# Patient Record
Sex: Male | Born: 1935
Health system: Southern US, Community
[De-identification: ages and names within clinical notes are randomized; demographics above are authoritative.]

## PROBLEM LIST (undated history)

## (undated) DIAGNOSIS — I4891 Unspecified atrial fibrillation: Secondary | ICD-10-CM

## (undated) DIAGNOSIS — Z7189 Other specified counseling: Secondary | ICD-10-CM

## (undated) DIAGNOSIS — R918 Other nonspecific abnormal finding of lung field: Secondary | ICD-10-CM

## (undated) DIAGNOSIS — Z973 Presence of spectacles and contact lenses: Secondary | ICD-10-CM

## (undated) DIAGNOSIS — C3490 Malignant neoplasm of unspecified part of unspecified bronchus or lung: Secondary | ICD-10-CM

## (undated) DIAGNOSIS — D509 Iron deficiency anemia, unspecified: Secondary | ICD-10-CM

## (undated) DIAGNOSIS — C3491 Malignant neoplasm of unspecified part of right bronchus or lung: Secondary | ICD-10-CM

## (undated) DIAGNOSIS — E039 Hypothyroidism, unspecified: Secondary | ICD-10-CM

## (undated) DIAGNOSIS — C3430 Malignant neoplasm of lower lobe, unspecified bronchus or lung: Secondary | ICD-10-CM

## (undated) DIAGNOSIS — C241 Malignant neoplasm of ampulla of Vater: Secondary | ICD-10-CM

## (undated) DIAGNOSIS — Z9289 Personal history of other medical treatment: Secondary | ICD-10-CM

## (undated) DIAGNOSIS — I10 Essential (primary) hypertension: Secondary | ICD-10-CM

## (undated) DIAGNOSIS — C7802 Secondary malignant neoplasm of left lung: Secondary | ICD-10-CM

## (undated) DIAGNOSIS — C801 Malignant (primary) neoplasm, unspecified: Secondary | ICD-10-CM

## (undated) HISTORY — DX: Iron deficiency anemia, unspecified: D50.9

## (undated) HISTORY — PX: CHOLECYSTECTOMY: SHX55

## (undated) HISTORY — PX: COLONOSCOPY: SHX174

## (undated) HISTORY — PX: MULTIPLE TOOTH EXTRACTIONS: SHX2053

## (undated) HISTORY — PX: TONSILLECTOMY: SUR1361

## (undated) HISTORY — DX: Malignant neoplasm of unspecified part of right bronchus or lung: C34.91

## (undated) HISTORY — PX: HERNIA REPAIR: SHX51

## (undated) HISTORY — DX: Secondary malignant neoplasm of left lung: C78.02

## (undated) HISTORY — DX: Malignant neoplasm of ampulla of Vater: C24.1

## (undated) HISTORY — DX: Personal history of other medical treatment: Z92.89

## (undated) HISTORY — DX: Other specified counseling: Z71.89

## (undated) HISTORY — DX: Malignant neoplasm of lower lobe, unspecified bronchus or lung: C34.30

---

## 1998-02-25 ENCOUNTER — Ambulatory Visit (HOSPITAL_BASED_OUTPATIENT_CLINIC_OR_DEPARTMENT_OTHER): Admission: RE | Admit: 1998-02-25 | Discharge: 1998-02-25 | Payer: Self-pay | Admitting: Surgery

## 2001-07-13 ENCOUNTER — Encounter: Payer: Self-pay | Admitting: Urology

## 2001-07-13 ENCOUNTER — Encounter: Admission: RE | Admit: 2001-07-13 | Discharge: 2001-07-13 | Payer: Self-pay | Admitting: Urology

## 2002-07-22 ENCOUNTER — Ambulatory Visit (HOSPITAL_COMMUNITY): Admission: RE | Admit: 2002-07-22 | Discharge: 2002-07-22 | Payer: Self-pay | Admitting: General Surgery

## 2002-07-22 ENCOUNTER — Encounter (INDEPENDENT_AMBULATORY_CARE_PROVIDER_SITE_OTHER): Payer: Self-pay | Admitting: Specialist

## 2002-09-12 ENCOUNTER — Encounter: Payer: Self-pay | Admitting: Gastroenterology

## 2002-09-12 ENCOUNTER — Encounter: Admission: RE | Admit: 2002-09-12 | Discharge: 2002-09-12 | Payer: Self-pay | Admitting: Gastroenterology

## 2002-09-13 ENCOUNTER — Encounter: Payer: Self-pay | Admitting: Gastroenterology

## 2002-09-13 ENCOUNTER — Encounter: Admission: RE | Admit: 2002-09-13 | Discharge: 2002-09-13 | Payer: Self-pay | Admitting: Gastroenterology

## 2002-09-17 ENCOUNTER — Ambulatory Visit (HOSPITAL_COMMUNITY): Admission: RE | Admit: 2002-09-17 | Discharge: 2002-09-17 | Payer: Self-pay | Admitting: Gastroenterology

## 2002-09-17 ENCOUNTER — Encounter: Payer: Self-pay | Admitting: Gastroenterology

## 2002-09-17 ENCOUNTER — Encounter (INDEPENDENT_AMBULATORY_CARE_PROVIDER_SITE_OTHER): Payer: Self-pay | Admitting: *Deleted

## 2002-10-04 ENCOUNTER — Encounter: Payer: Self-pay | Admitting: General Surgery

## 2002-10-09 ENCOUNTER — Inpatient Hospital Stay (HOSPITAL_COMMUNITY): Admission: RE | Admit: 2002-10-09 | Discharge: 2002-10-25 | Payer: Self-pay | Admitting: General Surgery

## 2002-10-09 ENCOUNTER — Encounter (INDEPENDENT_AMBULATORY_CARE_PROVIDER_SITE_OTHER): Payer: Self-pay | Admitting: Specialist

## 2002-10-09 ENCOUNTER — Encounter: Payer: Self-pay | Admitting: General Surgery

## 2002-10-18 ENCOUNTER — Encounter: Payer: Self-pay | Admitting: General Surgery

## 2002-11-11 ENCOUNTER — Ambulatory Visit: Admission: RE | Admit: 2002-11-11 | Discharge: 2003-01-15 | Payer: Self-pay | Admitting: Radiation Oncology

## 2003-02-18 ENCOUNTER — Ambulatory Visit: Admission: RE | Admit: 2003-02-18 | Discharge: 2003-02-18 | Payer: Self-pay | Admitting: Radiation Oncology

## 2003-03-04 ENCOUNTER — Encounter: Admission: RE | Admit: 2003-03-04 | Discharge: 2003-03-04 | Payer: Self-pay | Admitting: Hematology & Oncology

## 2003-05-15 ENCOUNTER — Encounter: Admission: RE | Admit: 2003-05-15 | Discharge: 2003-05-15 | Payer: Self-pay | Admitting: Hematology & Oncology

## 2003-05-26 ENCOUNTER — Ambulatory Visit (HOSPITAL_COMMUNITY): Admission: RE | Admit: 2003-05-26 | Discharge: 2003-05-26 | Payer: Self-pay | Admitting: Hematology & Oncology

## 2003-09-17 ENCOUNTER — Ambulatory Visit (HOSPITAL_COMMUNITY): Admission: RE | Admit: 2003-09-17 | Discharge: 2003-09-17 | Payer: Self-pay | Admitting: Hematology & Oncology

## 2004-01-08 ENCOUNTER — Ambulatory Visit: Payer: Self-pay | Admitting: Hematology & Oncology

## 2004-01-09 ENCOUNTER — Ambulatory Visit (HOSPITAL_COMMUNITY): Admission: RE | Admit: 2004-01-09 | Discharge: 2004-01-09 | Payer: Self-pay | Admitting: Hematology & Oncology

## 2004-05-05 ENCOUNTER — Ambulatory Visit: Payer: Self-pay | Admitting: Hematology & Oncology

## 2004-05-07 ENCOUNTER — Ambulatory Visit (HOSPITAL_COMMUNITY): Admission: RE | Admit: 2004-05-07 | Discharge: 2004-05-07 | Payer: Self-pay | Admitting: Hematology & Oncology

## 2004-09-14 ENCOUNTER — Ambulatory Visit: Payer: Self-pay | Admitting: Hematology & Oncology

## 2004-10-05 ENCOUNTER — Ambulatory Visit (HOSPITAL_COMMUNITY): Admission: RE | Admit: 2004-10-05 | Discharge: 2004-10-05 | Payer: Self-pay | Admitting: Hematology & Oncology

## 2005-03-29 ENCOUNTER — Ambulatory Visit: Payer: Self-pay | Admitting: Hematology & Oncology

## 2005-04-01 ENCOUNTER — Ambulatory Visit (HOSPITAL_COMMUNITY): Admission: RE | Admit: 2005-04-01 | Discharge: 2005-04-01 | Payer: Self-pay | Admitting: Hematology & Oncology

## 2005-09-26 ENCOUNTER — Ambulatory Visit: Payer: Self-pay | Admitting: Hematology & Oncology

## 2005-09-28 LAB — COMPREHENSIVE METABOLIC PANEL
AST: 15 U/L (ref 0–37)
Alkaline Phosphatase: 45 U/L (ref 39–117)
BUN: 23 mg/dL (ref 6–23)
Creatinine, Ser: 1.07 mg/dL (ref 0.40–1.50)
Glucose, Bld: 124 mg/dL — ABNORMAL HIGH (ref 70–99)
Total Bilirubin: 0.7 mg/dL (ref 0.3–1.2)

## 2005-09-28 LAB — CBC WITH DIFFERENTIAL/PLATELET
Basophils Absolute: 0 10*3/uL (ref 0.0–0.1)
EOS%: 8.9 % — ABNORMAL HIGH (ref 0.0–7.0)
HCT: 37.8 % — ABNORMAL LOW (ref 38.7–49.9)
HGB: 12.8 g/dL — ABNORMAL LOW (ref 13.0–17.1)
MCH: 31.2 pg (ref 28.0–33.4)
MCV: 92.2 fL (ref 81.6–98.0)
MONO%: 11.6 % (ref 0.0–13.0)
NEUT%: 61.6 % (ref 40.0–75.0)
lymph#: 0.8 10*3/uL — ABNORMAL LOW (ref 0.9–3.3)

## 2005-09-30 ENCOUNTER — Ambulatory Visit (HOSPITAL_COMMUNITY): Admission: RE | Admit: 2005-09-30 | Discharge: 2005-09-30 | Payer: Self-pay | Admitting: Hematology & Oncology

## 2005-12-01 ENCOUNTER — Ambulatory Visit: Payer: Self-pay | Admitting: Hematology & Oncology

## 2006-03-27 ENCOUNTER — Ambulatory Visit: Payer: Self-pay | Admitting: Hematology & Oncology

## 2006-03-29 LAB — CBC WITH DIFFERENTIAL/PLATELET
Basophils Absolute: 0 10*3/uL (ref 0.0–0.1)
Eosinophils Absolute: 0.4 10*3/uL (ref 0.0–0.5)
HCT: 38.1 % — ABNORMAL LOW (ref 38.7–49.9)
HGB: 13.2 g/dL (ref 13.0–17.1)
LYMPH%: 16.8 % (ref 14.0–48.0)
MONO#: 0.6 10*3/uL (ref 0.1–0.9)
NEUT#: 3.8 10*3/uL (ref 1.5–6.5)
Platelets: 138 10*3/uL — ABNORMAL LOW (ref 145–400)
RBC: 4.22 10*6/uL (ref 4.20–5.71)
WBC: 5.9 10*3/uL (ref 4.0–10.0)

## 2006-03-29 LAB — COMPREHENSIVE METABOLIC PANEL
Albumin: 3.9 g/dL (ref 3.5–5.2)
BUN: 19 mg/dL (ref 6–23)
CO2: 28 mEq/L (ref 19–32)
Glucose, Bld: 131 mg/dL — ABNORMAL HIGH (ref 70–99)
Sodium: 139 mEq/L (ref 135–145)
Total Bilirubin: 0.6 mg/dL (ref 0.3–1.2)
Total Protein: 6.5 g/dL (ref 6.0–8.3)

## 2006-03-29 LAB — CANCER ANTIGEN 19-9: CA 19-9: 6.6 U/mL (ref <35.0)

## 2006-03-31 ENCOUNTER — Ambulatory Visit (HOSPITAL_COMMUNITY): Admission: RE | Admit: 2006-03-31 | Discharge: 2006-03-31 | Payer: Self-pay | Admitting: Hematology & Oncology

## 2006-09-22 ENCOUNTER — Ambulatory Visit: Payer: Self-pay | Admitting: Hematology & Oncology

## 2006-09-26 LAB — COMPREHENSIVE METABOLIC PANEL
ALT: 14 U/L (ref 0–53)
BUN: 20 mg/dL (ref 6–23)
CO2: 29 mEq/L (ref 19–32)
Calcium: 10 mg/dL (ref 8.4–10.5)
Creatinine, Ser: 0.97 mg/dL (ref 0.40–1.50)
Glucose, Bld: 126 mg/dL — ABNORMAL HIGH (ref 70–99)
Total Bilirubin: 0.6 mg/dL (ref 0.3–1.2)

## 2006-09-26 LAB — CBC WITH DIFFERENTIAL/PLATELET
BASO%: 0.6 % (ref 0.0–2.0)
HCT: 37.3 % — ABNORMAL LOW (ref 38.7–49.9)
MCHC: 34.6 g/dL (ref 32.0–35.9)
MONO#: 0.5 10*3/uL (ref 0.1–0.9)
RBC: 4.09 10*6/uL — ABNORMAL LOW (ref 4.20–5.71)
WBC: 5.4 10*3/uL (ref 4.0–10.0)
lymph#: 1.2 10*3/uL (ref 0.9–3.3)

## 2006-09-26 LAB — CANCER ANTIGEN 19-9: CA 19-9: 1.4 U/mL (ref <35.0)

## 2006-09-27 ENCOUNTER — Ambulatory Visit (HOSPITAL_COMMUNITY): Admission: RE | Admit: 2006-09-27 | Discharge: 2006-09-27 | Payer: Self-pay | Admitting: Hematology & Oncology

## 2007-03-26 ENCOUNTER — Ambulatory Visit: Payer: Self-pay | Admitting: Hematology & Oncology

## 2007-03-28 LAB — CBC WITH DIFFERENTIAL/PLATELET
BASO%: 0.1 % (ref 0.0–2.0)
EOS%: 7.6 % — ABNORMAL HIGH (ref 0.0–7.0)
HCT: 44.3 % (ref 38.7–49.9)
LYMPH%: 20.9 % (ref 14.0–48.0)
MCH: 29 pg (ref 28.0–33.4)
MCHC: 31.7 g/dL — ABNORMAL LOW (ref 32.0–35.9)
NEUT%: 60.2 % (ref 40.0–75.0)
Platelets: 167 10*3/uL (ref 145–400)
lymph#: 1.5 10*3/uL (ref 0.9–3.3)

## 2007-03-28 LAB — COMPREHENSIVE METABOLIC PANEL
ALT: 16 U/L (ref 0–53)
AST: 14 U/L (ref 0–37)
Creatinine, Ser: 1.04 mg/dL (ref 0.40–1.50)
Total Bilirubin: 0.7 mg/dL (ref 0.3–1.2)

## 2007-03-29 ENCOUNTER — Ambulatory Visit (HOSPITAL_COMMUNITY): Admission: RE | Admit: 2007-03-29 | Discharge: 2007-03-29 | Payer: Self-pay | Admitting: Hematology & Oncology

## 2007-04-11 ENCOUNTER — Ambulatory Visit (HOSPITAL_COMMUNITY): Admission: RE | Admit: 2007-04-11 | Discharge: 2007-04-11 | Payer: Self-pay | Admitting: Hematology & Oncology

## 2007-05-31 ENCOUNTER — Ambulatory Visit (HOSPITAL_COMMUNITY): Admission: RE | Admit: 2007-05-31 | Discharge: 2007-05-31 | Payer: Self-pay | Admitting: Hematology & Oncology

## 2007-06-12 ENCOUNTER — Ambulatory Visit: Payer: Self-pay | Admitting: Thoracic Surgery (Cardiothoracic Vascular Surgery)

## 2007-06-13 ENCOUNTER — Ambulatory Visit: Payer: Self-pay | Admitting: Hematology & Oncology

## 2007-07-05 ENCOUNTER — Ambulatory Visit (HOSPITAL_COMMUNITY): Admission: RE | Admit: 2007-07-05 | Discharge: 2007-07-05 | Payer: Self-pay | Admitting: Cardiology

## 2007-09-03 ENCOUNTER — Ambulatory Visit: Payer: Self-pay | Admitting: Hematology & Oncology

## 2007-09-04 ENCOUNTER — Ambulatory Visit: Payer: Self-pay | Admitting: Hematology & Oncology

## 2007-09-04 LAB — CBC WITH DIFFERENTIAL/PLATELET
BASO%: 0.5 % (ref 0.0–2.0)
Basophils Absolute: 0 10*3/uL (ref 0.0–0.1)
Eosinophils Absolute: 0.4 10*3/uL (ref 0.0–0.5)
HCT: 39.4 % (ref 38.7–49.9)
HGB: 13.4 g/dL (ref 13.0–17.1)
MONO#: 0.7 10*3/uL (ref 0.1–0.9)
NEUT#: 2.9 10*3/uL (ref 1.5–6.5)
NEUT%: 56.8 % (ref 40.0–75.0)
Platelets: 125 10*3/uL — ABNORMAL LOW (ref 145–400)
WBC: 5.1 10*3/uL (ref 4.0–10.0)
lymph#: 1.1 10*3/uL (ref 0.9–3.3)

## 2007-09-04 LAB — COMPREHENSIVE METABOLIC PANEL
ALT: 12 U/L (ref 0–53)
AST: 15 U/L (ref 0–37)
Calcium: 9.5 mg/dL (ref 8.4–10.5)
Chloride: 101 mEq/L (ref 96–112)
Creatinine, Ser: 1.08 mg/dL (ref 0.40–1.50)
Sodium: 140 mEq/L (ref 135–145)

## 2007-09-24 ENCOUNTER — Ambulatory Visit (HOSPITAL_COMMUNITY): Admission: RE | Admit: 2007-09-24 | Discharge: 2007-09-24 | Payer: Self-pay | Admitting: Hematology & Oncology

## 2008-01-09 ENCOUNTER — Ambulatory Visit: Payer: Self-pay | Admitting: Hematology & Oncology

## 2008-01-11 LAB — CBC WITH DIFFERENTIAL/PLATELET
Eosinophils Absolute: 0.4 10*3/uL (ref 0.0–0.5)
HCT: 39.5 % (ref 38.7–49.9)
LYMPH%: 23.9 % (ref 14.0–48.0)
MCHC: 33.8 g/dL (ref 32.0–35.9)
MCV: 92.1 fL (ref 81.6–98.0)
MONO#: 0.6 10*3/uL (ref 0.1–0.9)
MONO%: 11.4 % (ref 0.0–13.0)
NEUT#: 2.9 10*3/uL (ref 1.5–6.5)
NEUT%: 57.1 % (ref 40.0–75.0)
Platelets: 145 10*3/uL (ref 145–400)
RBC: 4.29 10*6/uL (ref 4.20–5.71)
WBC: 5 10*3/uL (ref 4.0–10.0)

## 2008-01-11 LAB — COMPREHENSIVE METABOLIC PANEL
Alkaline Phosphatase: 53 U/L (ref 39–117)
CO2: 28 mEq/L (ref 19–32)
Creatinine, Ser: 1 mg/dL (ref 0.40–1.50)
Glucose, Bld: 141 mg/dL — ABNORMAL HIGH (ref 70–99)
Total Bilirubin: 0.5 mg/dL (ref 0.3–1.2)

## 2008-01-11 LAB — CANCER ANTIGEN 19-9: CA 19-9: 16 U/mL (ref <35.0)

## 2008-01-15 ENCOUNTER — Ambulatory Visit (HOSPITAL_COMMUNITY): Admission: RE | Admit: 2008-01-15 | Discharge: 2008-01-15 | Payer: Self-pay | Admitting: Hematology & Oncology

## 2008-01-17 ENCOUNTER — Ambulatory Visit: Payer: Self-pay | Admitting: Hematology & Oncology

## 2008-01-18 ENCOUNTER — Ambulatory Visit: Payer: Self-pay | Admitting: Thoracic Surgery (Cardiothoracic Vascular Surgery)

## 2008-03-05 ENCOUNTER — Inpatient Hospital Stay (HOSPITAL_COMMUNITY)
Admission: RE | Admit: 2008-03-05 | Discharge: 2008-03-10 | Payer: Self-pay | Admitting: Thoracic Surgery (Cardiothoracic Vascular Surgery)

## 2008-03-05 ENCOUNTER — Ambulatory Visit: Payer: Self-pay | Admitting: Thoracic Surgery (Cardiothoracic Vascular Surgery)

## 2008-03-05 ENCOUNTER — Encounter: Payer: Self-pay | Admitting: Thoracic Surgery (Cardiothoracic Vascular Surgery)

## 2008-03-17 ENCOUNTER — Ambulatory Visit: Payer: Self-pay | Admitting: Thoracic Surgery (Cardiothoracic Vascular Surgery)

## 2008-03-28 ENCOUNTER — Ambulatory Visit: Payer: Self-pay | Admitting: Hematology & Oncology

## 2008-04-03 ENCOUNTER — Encounter
Admission: RE | Admit: 2008-04-03 | Discharge: 2008-04-03 | Payer: Self-pay | Admitting: Thoracic Surgery (Cardiothoracic Vascular Surgery)

## 2008-04-03 ENCOUNTER — Ambulatory Visit: Payer: Self-pay | Admitting: Thoracic Surgery (Cardiothoracic Vascular Surgery)

## 2008-06-12 ENCOUNTER — Ambulatory Visit: Payer: Self-pay | Admitting: Hematology & Oncology

## 2008-06-16 LAB — COMPREHENSIVE METABOLIC PANEL
ALT: 10 U/L (ref 0–53)
AST: 15 U/L (ref 0–37)
Albumin: 3.8 g/dL (ref 3.5–5.2)
Alkaline Phosphatase: 59 U/L (ref 39–117)
BUN: 16 mg/dL (ref 6–23)
CO2: 25 mEq/L (ref 19–32)
Calcium: 8.8 mg/dL (ref 8.4–10.5)
Chloride: 103 mEq/L (ref 96–112)
Creatinine, Ser: 1.03 mg/dL (ref 0.40–1.50)
Glucose, Bld: 156 mg/dL — ABNORMAL HIGH (ref 70–99)
Potassium: 4.3 mEq/L (ref 3.5–5.3)
Sodium: 140 mEq/L (ref 135–145)
Total Bilirubin: 0.4 mg/dL (ref 0.3–1.2)
Total Protein: 6.5 g/dL (ref 6.0–8.3)

## 2008-06-16 LAB — CBC WITH DIFFERENTIAL/PLATELET
Eosinophils Absolute: 0.3 10*3/uL (ref 0.0–0.5)
LYMPH%: 17.7 % (ref 14.0–49.0)
MONO#: 0.6 10*3/uL (ref 0.1–0.9)
NEUT#: 3.1 10*3/uL (ref 1.5–6.5)
Platelets: 137 10*3/uL — ABNORMAL LOW (ref 140–400)
RBC: 4 10*6/uL — ABNORMAL LOW (ref 4.20–5.82)
RDW: 14 % (ref 11.0–14.6)
WBC: 4.9 10*3/uL (ref 4.0–10.3)
lymph#: 0.9 10*3/uL (ref 0.9–3.3)

## 2008-06-16 LAB — CANCER ANTIGEN 19-9: CA 19-9: 8.5 U/mL (ref <35.0)

## 2008-06-18 ENCOUNTER — Ambulatory Visit (HOSPITAL_COMMUNITY): Admission: RE | Admit: 2008-06-18 | Discharge: 2008-06-18 | Payer: Self-pay | Admitting: Gastroenterology

## 2008-06-27 ENCOUNTER — Ambulatory Visit: Payer: Self-pay | Admitting: Hematology & Oncology

## 2008-07-29 ENCOUNTER — Ambulatory Visit: Payer: Self-pay | Admitting: Thoracic Surgery (Cardiothoracic Vascular Surgery)

## 2008-11-27 ENCOUNTER — Ambulatory Visit: Payer: Self-pay | Admitting: Hematology

## 2008-12-01 LAB — CBC WITH DIFFERENTIAL/PLATELET
Eosinophils Absolute: 0.4 10*3/uL (ref 0.0–0.5)
MCV: 91 fL (ref 79.3–98.0)
MONO%: 12.8 % (ref 0.0–14.0)
NEUT#: 3.1 10*3/uL (ref 1.5–6.5)
RBC: 4.42 10*6/uL (ref 4.20–5.82)
RDW: 15 % — ABNORMAL HIGH (ref 11.0–14.6)
WBC: 5.4 10*3/uL (ref 4.0–10.3)

## 2008-12-02 LAB — COMPREHENSIVE METABOLIC PANEL
ALT: 15 U/L (ref 0–53)
BUN: 20 mg/dL (ref 6–23)
CO2: 32 mEq/L (ref 19–32)
Calcium: 9.5 mg/dL (ref 8.4–10.5)
Chloride: 97 mEq/L (ref 96–112)
Creatinine, Ser: 1.06 mg/dL (ref 0.40–1.50)
Glucose, Bld: 177 mg/dL — ABNORMAL HIGH (ref 70–99)

## 2008-12-02 LAB — TESTOSTERONE: Testosterone: 261.44 ng/dL — ABNORMAL LOW (ref 350–890)

## 2008-12-03 ENCOUNTER — Ambulatory Visit (HOSPITAL_COMMUNITY): Admission: RE | Admit: 2008-12-03 | Discharge: 2008-12-03 | Payer: Self-pay | Admitting: Hematology & Oncology

## 2008-12-12 ENCOUNTER — Ambulatory Visit: Payer: Self-pay | Admitting: Hematology & Oncology

## 2009-01-08 ENCOUNTER — Ambulatory Visit: Payer: Self-pay | Admitting: Thoracic Surgery (Cardiothoracic Vascular Surgery)

## 2009-04-01 ENCOUNTER — Ambulatory Visit: Payer: Self-pay | Admitting: Hematology

## 2009-04-20 LAB — CBC WITH DIFFERENTIAL/PLATELET
Basophils Absolute: 0 10*3/uL (ref 0.0–0.1)
EOS%: 3.8 % (ref 0.0–7.0)
HCT: 35.1 % — ABNORMAL LOW (ref 38.4–49.9)
HGB: 11.7 g/dL — ABNORMAL LOW (ref 13.0–17.1)
LYMPH%: 13.9 % — ABNORMAL LOW (ref 14.0–49.0)
MCH: 30.6 pg (ref 27.2–33.4)
MCV: 91.9 fL (ref 79.3–98.0)
MONO%: 11.1 % (ref 0.0–14.0)
NEUT%: 70.8 % (ref 39.0–75.0)
Platelets: 135 10*3/uL — ABNORMAL LOW (ref 140–400)
lymph#: 0.8 10*3/uL — ABNORMAL LOW (ref 0.9–3.3)

## 2009-04-20 LAB — COMPREHENSIVE METABOLIC PANEL
AST: 14 U/L (ref 0–37)
BUN: 17 mg/dL (ref 6–23)
Calcium: 9.3 mg/dL (ref 8.4–10.5)
Chloride: 101 mEq/L (ref 96–112)
Creatinine, Ser: 1.02 mg/dL (ref 0.40–1.50)
Total Bilirubin: 0.3 mg/dL (ref 0.3–1.2)

## 2009-04-20 LAB — VITAMIN D 25 HYDROXY (VIT D DEFICIENCY, FRACTURES): Vit D, 25-Hydroxy: 23 ng/mL — ABNORMAL LOW (ref 30–89)

## 2009-04-22 ENCOUNTER — Ambulatory Visit (HOSPITAL_COMMUNITY): Admission: RE | Admit: 2009-04-22 | Discharge: 2009-04-22 | Payer: Self-pay | Admitting: Hematology & Oncology

## 2009-04-24 ENCOUNTER — Ambulatory Visit: Payer: Self-pay | Admitting: Hematology & Oncology

## 2009-06-11 ENCOUNTER — Ambulatory Visit: Payer: Self-pay | Admitting: Thoracic Surgery (Cardiothoracic Vascular Surgery)

## 2009-09-24 ENCOUNTER — Ambulatory Visit: Payer: Self-pay | Admitting: Hematology

## 2009-09-28 LAB — CBC WITH DIFFERENTIAL/PLATELET
BASO%: 0.3 % (ref 0.0–2.0)
LYMPH%: 12.4 % — ABNORMAL LOW (ref 14.0–49.0)
MCHC: 33 g/dL (ref 32.0–36.0)
MCV: 83.6 fL (ref 79.3–98.0)
MONO#: 0.6 10*3/uL (ref 0.1–0.9)
MONO%: 7.7 % (ref 0.0–14.0)
NEUT#: 5.8 10*3/uL (ref 1.5–6.5)
Platelets: 145 10*3/uL (ref 140–400)
RBC: 3.85 10*6/uL — ABNORMAL LOW (ref 4.20–5.82)
RDW: 16.5 % — ABNORMAL HIGH (ref 11.0–14.6)
WBC: 7.5 10*3/uL (ref 4.0–10.3)

## 2009-09-28 LAB — COMPREHENSIVE METABOLIC PANEL
ALT: 81 U/L — ABNORMAL HIGH (ref 0–53)
Albumin: 3.3 g/dL — ABNORMAL LOW (ref 3.5–5.2)
Alkaline Phosphatase: 99 U/L (ref 39–117)
Potassium: 3.8 mEq/L (ref 3.5–5.3)
Sodium: 139 mEq/L (ref 135–145)
Total Bilirubin: 0.5 mg/dL (ref 0.3–1.2)
Total Protein: 7.1 g/dL (ref 6.0–8.3)

## 2009-09-29 ENCOUNTER — Ambulatory Visit (HOSPITAL_COMMUNITY): Admission: RE | Admit: 2009-09-29 | Discharge: 2009-09-29 | Payer: Self-pay | Admitting: Hematology & Oncology

## 2009-10-13 ENCOUNTER — Ambulatory Visit: Payer: Self-pay | Admitting: Hematology & Oncology

## 2010-02-27 ENCOUNTER — Other Ambulatory Visit: Payer: Self-pay | Admitting: Hematology & Oncology

## 2010-02-27 DIAGNOSIS — C3490 Malignant neoplasm of unspecified part of unspecified bronchus or lung: Secondary | ICD-10-CM

## 2010-02-28 ENCOUNTER — Encounter: Payer: Self-pay | Admitting: Hematology & Oncology

## 2010-04-06 ENCOUNTER — Other Ambulatory Visit: Payer: Self-pay | Admitting: Hematology & Oncology

## 2010-04-06 ENCOUNTER — Encounter (HOSPITAL_BASED_OUTPATIENT_CLINIC_OR_DEPARTMENT_OTHER): Payer: MEDICARE | Admitting: Oncology

## 2010-04-06 DIAGNOSIS — Z8509 Personal history of malignant neoplasm of other digestive organs: Secondary | ICD-10-CM

## 2010-04-06 DIAGNOSIS — C3430 Malignant neoplasm of lower lobe, unspecified bronchus or lung: Secondary | ICD-10-CM

## 2010-04-07 ENCOUNTER — Ambulatory Visit (HOSPITAL_COMMUNITY)
Admission: RE | Admit: 2010-04-07 | Discharge: 2010-04-07 | Disposition: A | Discharge status: Home / Self Care | Payer: MEDICARE | Source: Ambulatory Visit | Attending: Hematology & Oncology | Admitting: Hematology & Oncology

## 2010-04-07 ENCOUNTER — Encounter (HOSPITAL_COMMUNITY): Payer: Self-pay

## 2010-04-07 DIAGNOSIS — C259 Malignant neoplasm of pancreas, unspecified: Secondary | ICD-10-CM | POA: Insufficient documentation

## 2010-04-07 DIAGNOSIS — M948X9 Other specified disorders of cartilage, unspecified sites: Secondary | ICD-10-CM | POA: Insufficient documentation

## 2010-04-07 DIAGNOSIS — Z79899 Other long term (current) drug therapy: Secondary | ICD-10-CM | POA: Insufficient documentation

## 2010-04-07 DIAGNOSIS — N289 Disorder of kidney and ureter, unspecified: Secondary | ICD-10-CM | POA: Insufficient documentation

## 2010-04-07 DIAGNOSIS — I517 Cardiomegaly: Secondary | ICD-10-CM | POA: Insufficient documentation

## 2010-04-07 DIAGNOSIS — Z903 Acquired absence of stomach [part of]: Secondary | ICD-10-CM | POA: Insufficient documentation

## 2010-04-07 DIAGNOSIS — C3490 Malignant neoplasm of unspecified part of unspecified bronchus or lung: Secondary | ICD-10-CM

## 2010-04-07 DIAGNOSIS — I2510 Atherosclerotic heart disease of native coronary artery without angina pectoris: Secondary | ICD-10-CM | POA: Insufficient documentation

## 2010-04-07 HISTORY — DX: Essential (primary) hypertension: I10

## 2010-04-07 HISTORY — DX: Malignant neoplasm of unspecified part of unspecified bronchus or lung: C34.90

## 2010-04-07 HISTORY — DX: Malignant (primary) neoplasm, unspecified: C80.1

## 2010-04-07 LAB — COMPREHENSIVE METABOLIC PANEL
ALT: 16 U/L (ref 0–53)
Alkaline Phosphatase: 60 U/L (ref 39–117)
CO2: 28 mEq/L (ref 19–32)
Sodium: 139 mEq/L (ref 135–145)
Total Bilirubin: 0.5 mg/dL (ref 0.3–1.2)
Total Protein: 6.3 g/dL (ref 6.0–8.3)

## 2010-04-07 LAB — LACTATE DEHYDROGENASE: LDH: 134 U/L (ref 94–250)

## 2010-04-07 MED ORDER — IOHEXOL 300 MG/ML  SOLN
100.0000 mL | Freq: Once | INTRAMUSCULAR | Status: AC | PRN
  Administered 2010-04-07: 100 mL via INTRAVENOUS

## 2010-04-21 ENCOUNTER — Other Ambulatory Visit: Payer: Self-pay | Admitting: Hematology & Oncology

## 2010-04-21 ENCOUNTER — Encounter (HOSPITAL_BASED_OUTPATIENT_CLINIC_OR_DEPARTMENT_OTHER): Payer: MEDICARE | Admitting: Hematology & Oncology

## 2010-04-21 DIAGNOSIS — Z8509 Personal history of malignant neoplasm of other digestive organs: Secondary | ICD-10-CM

## 2010-04-21 DIAGNOSIS — C3430 Malignant neoplasm of lower lobe, unspecified bronchus or lung: Secondary | ICD-10-CM

## 2010-04-21 LAB — CBC WITH DIFFERENTIAL (CANCER CENTER ONLY)
BASO#: 0.1 10*3/uL (ref 0.0–0.2)
Eosinophils Absolute: 0.5 10*3/uL (ref 0.0–0.5)
HGB: 10.6 g/dL — ABNORMAL LOW (ref 13.0–17.1)
LYMPH%: 23.1 % (ref 14.0–48.0)
MCH: 26.5 pg — ABNORMAL LOW (ref 28.0–33.4)
MCV: 84 fL (ref 82–98)
MONO%: 12.9 % (ref 0.0–13.0)
RBC: 4 10*6/uL — ABNORMAL LOW (ref 4.20–5.70)

## 2010-04-26 ENCOUNTER — Encounter: Payer: MEDICARE | Admitting: Thoracic Surgery (Cardiothoracic Vascular Surgery)

## 2010-05-24 LAB — CBC
HCT: 37.4 % — ABNORMAL LOW (ref 39.0–52.0)
HCT: 38.8 % — ABNORMAL LOW (ref 39.0–52.0)
Hemoglobin: 11.5 g/dL — ABNORMAL LOW (ref 13.0–17.0)
Hemoglobin: 12.4 g/dL — ABNORMAL LOW (ref 13.0–17.0)
Hemoglobin: 12.9 g/dL — ABNORMAL LOW (ref 13.0–17.0)
MCHC: 32.9 g/dL (ref 30.0–36.0)
MCHC: 33.2 g/dL (ref 30.0–36.0)
Platelets: 111 10*3/uL — ABNORMAL LOW (ref 150–400)
Platelets: 122 10*3/uL — ABNORMAL LOW (ref 150–400)
Platelets: 123 10*3/uL — ABNORMAL LOW (ref 150–400)
Platelets: 130 10*3/uL — ABNORMAL LOW (ref 150–400)
RDW: 14.2 % (ref 11.5–15.5)
RDW: 14.2 % (ref 11.5–15.5)
RDW: 14.7 % (ref 11.5–15.5)
WBC: 7.2 10*3/uL (ref 4.0–10.5)

## 2010-05-24 LAB — COMPREHENSIVE METABOLIC PANEL
Albumin: 2.7 g/dL — ABNORMAL LOW (ref 3.5–5.2)
Albumin: 3.5 g/dL (ref 3.5–5.2)
Alkaline Phosphatase: 44 U/L (ref 39–117)
Alkaline Phosphatase: 86 U/L (ref 39–117)
BUN: 13 mg/dL (ref 6–23)
BUN: 8 mg/dL (ref 6–23)
Calcium: 9.4 mg/dL (ref 8.4–10.5)
Chloride: 99 mEq/L (ref 96–112)
Glucose, Bld: 201 mg/dL — ABNORMAL HIGH (ref 70–99)
Potassium: 3.7 mEq/L (ref 3.5–5.1)
Potassium: 4.1 mEq/L (ref 3.5–5.1)
Sodium: 137 mEq/L (ref 135–145)
Total Bilirubin: 1 mg/dL (ref 0.3–1.2)
Total Protein: 6.2 g/dL (ref 6.0–8.3)

## 2010-05-24 LAB — BASIC METABOLIC PANEL
BUN: 10 mg/dL (ref 6–23)
BUN: 9 mg/dL (ref 6–23)
CO2: 29 mEq/L (ref 19–32)
CO2: 31 mEq/L (ref 19–32)
Calcium: 8.2 mg/dL — ABNORMAL LOW (ref 8.4–10.5)
Calcium: 8.4 mg/dL (ref 8.4–10.5)
Chloride: 101 mEq/L (ref 96–112)
Creatinine, Ser: 1.07 mg/dL (ref 0.4–1.5)
GFR calc Af Amer: >60 mL/min (ref >60)
GFR calc non Af Amer: >60 mL/min (ref >60)
Glucose, Bld: 157 mg/dL — ABNORMAL HIGH (ref 70–99)
Glucose, Bld: 88 mg/dL (ref 70–99)
Potassium: 3.7 mEq/L (ref 3.5–5.1)
Sodium: 139 mEq/L (ref 135–145)

## 2010-05-24 LAB — URINALYSIS, ROUTINE W REFLEX MICROSCOPIC
Nitrite: NEGATIVE
Protein, ur: NEGATIVE mg/dL
Specific Gravity, Urine: 1.018 (ref 1.005–1.030)
Urobilinogen, UA: 0.2 mg/dL (ref 0.0–1.0)

## 2010-05-24 LAB — GLUCOSE, CAPILLARY
Glucose-Capillary: 133 mg/dL — ABNORMAL HIGH (ref 70–99)
Glucose-Capillary: 154 mg/dL — ABNORMAL HIGH (ref 70–99)
Glucose-Capillary: 158 mg/dL — ABNORMAL HIGH (ref 70–99)
Glucose-Capillary: 161 mg/dL — ABNORMAL HIGH (ref 70–99)
Glucose-Capillary: 161 mg/dL — ABNORMAL HIGH (ref 70–99)
Glucose-Capillary: 173 mg/dL — ABNORMAL HIGH (ref 70–99)
Glucose-Capillary: 179 mg/dL — ABNORMAL HIGH (ref 70–99)
Glucose-Capillary: 199 mg/dL — ABNORMAL HIGH (ref 70–99)
Glucose-Capillary: 204 mg/dL — ABNORMAL HIGH (ref 70–99)
Glucose-Capillary: 226 mg/dL — ABNORMAL HIGH (ref 70–99)

## 2010-05-24 LAB — BLOOD GAS, ARTERIAL
Bicarbonate: 27.8 mEq/L — ABNORMAL HIGH (ref 20.0–24.0)
FIO2: 0.21 %
O2 Saturation: 96.4 %
O2 Saturation: 97.3 %
Patient temperature: 98.6
Patient temperature: 99.5
TCO2: 29.1 mmol/L (ref 0–100)
pH, Arterial: 7.437 (ref 7.350–7.450)

## 2010-05-24 LAB — PROTIME-INR
INR: 1.2 (ref 0.00–1.49)
INR: 1.2 (ref 0.00–1.49)
INR: 1.2 (ref 0.00–1.49)
Prothrombin Time: 12.7 seconds (ref 11.6–15.2)
Prothrombin Time: 15.9 seconds — ABNORMAL HIGH (ref 11.6–15.2)

## 2010-05-24 LAB — TYPE AND SCREEN
ABO/RH(D): A POS
Antibody Screen: NEGATIVE

## 2010-05-25 LAB — GLUCOSE, CAPILLARY

## 2010-05-25 LAB — PROTIME-INR
INR: 1.8 — ABNORMAL HIGH (ref 0.00–1.49)
Prothrombin Time: 21.4 seconds — ABNORMAL HIGH (ref 11.6–15.2)

## 2010-06-03 ENCOUNTER — Encounter (HOSPITAL_BASED_OUTPATIENT_CLINIC_OR_DEPARTMENT_OTHER): Payer: MEDICARE | Admitting: Hematology & Oncology

## 2010-06-03 ENCOUNTER — Other Ambulatory Visit: Payer: Self-pay | Admitting: Hematology & Oncology

## 2010-06-03 DIAGNOSIS — Z8509 Personal history of malignant neoplasm of other digestive organs: Secondary | ICD-10-CM

## 2010-06-03 DIAGNOSIS — C3430 Malignant neoplasm of lower lobe, unspecified bronchus or lung: Secondary | ICD-10-CM

## 2010-06-03 DIAGNOSIS — D649 Anemia, unspecified: Secondary | ICD-10-CM

## 2010-06-03 LAB — CBC WITH DIFFERENTIAL (CANCER CENTER ONLY)
BASO#: 0 10*3/uL (ref 0.0–0.2)
BASO%: 0.7 % (ref 0.0–2.0)
HCT: 33.9 % — ABNORMAL LOW (ref 38.7–49.9)
HGB: 10.8 g/dL — ABNORMAL LOW (ref 13.0–17.1)
LYMPH%: 20.9 % (ref 14.0–48.0)
MCHC: 31.9 g/dL — ABNORMAL LOW (ref 32.0–35.9)
MCV: 84 fL (ref 82–98)
MONO#: 0.8 10*3/uL (ref 0.1–0.9)
NEUT%: 56.1 % (ref 40.0–80.0)
RDW: 14.9 % (ref 11.1–15.7)

## 2010-06-04 LAB — IRON AND TIBC
%SAT: 9 % — ABNORMAL LOW (ref 20–55)
Iron: 31 ug/dL — ABNORMAL LOW (ref 42–165)
UIBC: 330 ug/dL

## 2010-06-04 LAB — ERYTHROPOIETIN: Erythropoietin: 50 m[IU]/mL — ABNORMAL HIGH (ref 2.6–34.0)

## 2010-06-04 LAB — RETICULOCYTES (CHCC): ABS Retic: 46.1 10*3/uL (ref 19.0–186.0)

## 2010-06-22 NOTE — Assessment & Plan Note (Signed)
OFFICE VISIT   [name], [name]  DOB:  July 16, 1933                                        July 29, 2008  CHART #:  [id number]   The patient is a 77 year old gentleman, who had left superior  segmentectomy and node dissection on March 05, 2008, for stage IA  adenocarcinoma of the lung.  He has a past history of an adenocarcinoma  of ampulla of Vater, which was resected about 4-1/2 years ago.  He did  not receive adjuvant chemotherapy for his lung cancer because he was  stage IA.  He was last seen in the office on April 03, 2008, at which  time he was recovering well from the surgery but had a persistent cough.  He had a CT scan and saw Dr. Myna Hidalgo in May and now returns for followup  visit.  He states that he is doing well overall.  He is not having any  problems directly with his breathing, but he does say he just kind of  has a lack of energy.  I think some of it may be due to the recent heat  that we have been having but he does not particularly get short of  breath.  He does not get chest pain when he exerts himself but just does  not have much energy, feels fatigued a lot of the time.   He denies any new headaches or neurologic symptoms.  Denies any  hemoptysis.  His cough has resolved.  He is not having any new or  unusual bone or joint pain.   Medications were reviewed.  There had been no interval changes with the  exception of his lisinopril which have been discontinued before his  previous visit.   PHYSICAL EXAMINATION:  GENERAL:  The patient is a 77 year old gentleman  in no acute distress.  VITAL SIGNS:  His blood pressure is 135/78, pulse 64, respirations are  18, and his oxygen saturation is 97% on room air.  LUNGS:  Equal breath sounds bilaterally.  CARDIAC:  Regular rate and rhythm.  Normal S1 and S2.  His pulse is  regular.  NECK:  He has no cervical, supraclavicular, axillary, or epitrochlear  adenopathy.   CT scan from May is  reviewed.  It showed some postoperative changes from  the superior segmentectomy.  The radiologist was concerned with some  soft tissue at the bronchial resection site, but that appears to be a  normal postoperative change this early after surgery.   IMPRESSION:  The patient is a 78 year old gentleman.  He is now almost 6  months out from a superior segmentectomy.  He is doing well at this  point in time with no evidence of recurrent disease.  He is to get  another CT scan and there is a scheduled visit with Dr. Myna Hidalgo in about  6 months.  I will plan to see him back after he has the CT scan done to  check on his progress.   Salvatore Decent [name] Fetch, M.D.  Electronically Signed   SCH/MEDQ  D:  07/29/2008  T:  07/30/2008  Job:  [id number]   cc:   Rose Phi. Myna Hidalgo, M.D.  Thora Lance, M.D.

## 2010-06-22 NOTE — Consult Note (Signed)
NEW PATIENT CONSULTATION   CROSBY, ORIORDAN  DOB:  [birth year]                                        Jun 12, 2007  CHART #:  [id number]   HISTORY OF PRESENT ILLNESS:  The patient is a 77 year old gentleman who  presents for evaluation of a left lung nodule.   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  He had a Whipple procedure done  by Dr. Johna Sheriff about 4-1/2 years ago and has had no evidence of  recurrent disease.  He has been followed with CT scans since that time.  He recently in February had a CT scan which showed a 9 x 8 mm nodule in  the left lung.  A PET scan was done which showed no evidence of  increased uptake in the lesion.  He then had a CT repeated on April 23  which showed the nodule to be 9 x 9 mm in diameter.  Of note, looking  back at old films, a CT from August of 2007 showed the nodule and it was  only about 5 mm in size at that time.   The patient states that he has been doing well.  He has chronic  shortness of breath with exertion which he has had for 40 years.  He  does have atrial fibrillation and is scheduled for a cardioversion next  week.  There has been no acute change in his shortness of breath,  otherwise.  He denies wheezing, cough, hemoptysis, or weight loss.   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   CURRENT MEDICATIONS:  1. Lipitor 10 mg daily.  2. Lisinopril 20 mg daily.  3. Pancreatic enzymes one tablet with meals.  4. Synthroid 125 mcg daily.  5. Coumadin as directed.  6. Lopressor 25 mg b.i.d.  7. Metformin 500 mg b.i.d.   ALLERGIES:  No known drug allergies.   FAMILY HISTORY:  Brother died of lung cancer.  Father had emphysema.   SOCIAL HISTORY:  He is married with two grown children.  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  He does have a past history  of smoking, but he quit 35 years ago.   REVIEW OF SYSTEMS:  See patient medical history form.  Atrial  fibrillation as noted.  Shortness of breath with exertion which has been  stable for many years.  All other systems are negative.   PHYSICAL EXAMINATION:  GENERAL:  The patient is a 77 year old white male  in no acute distress.  VITAL SIGNS:  Blood pressure 151/107, pulse 99, respirations 18, oxygen  saturations 95% on room air.  NEUROLOGY:  He is alert and oriented x3, appropriate, grossly intact  with no focal deficits.  HEENT:  Unremarkable.  NECK:  Supple without thyromegaly, adenopathy, or bruits.  HEART:  Regular rate and rhythm, normal S1 and S2.  There is no rub or  murmur.  LUNGS:  Clear with equal breath sounds bilaterally.  There is no  cervical or supraclavicular adenopathy.  EXTREMITIES:  Without cyanosis, clubbing, or edema.   CT scans and PET scan were reviewed.  Findings as  previously noted.   LABORATORY DATA:  March 28, 2007, showed white count of 7.1,  hematocrit 44, platelets 167.  Sodium 140, potassium 4.2, BUN and  creatinine 21 and 1.0, total bilirubin 0.7, alkaline phosphatase 52,  albumin 4.4.  CA19-9 was 7.8.   IMPRESSION:  The patient is a 77 year old gentleman with a history of  carcinoma of the ampulla of Vater.  He is now 4-1/2 years out from  resection with no evidence of recurrent disease.  He does have a left  lung nodule which has been present since at least August of 2007 and has  definitely increased in size since that time.  He had a CT scan in  February which showed it to be about 9 x 8 mm.  Now more recent CT in  April shows 9 x 9 mm.  Of note, he had a PET scan which showed no  evidence of increased FDG uptake in the nodule.  I discussed in detail  with Mr. and Mrs. Brooker the issues involved.  We reviewed the films  together.  They understand the margin of error for measuring small  lung  nodules of this type.  They also understand that while clear growth is  not definitive over the past two months, it cannot definitively be ruled  out either.  They also understand that a negative PET scan while with a  nodule of this size is not definitive in ruling out cancer either.  We  discussed the options which in his case would be continued follow-up of  this lesion with serial CT scans versus proceeding with VATS and wedge  resection for definitive diagnosis and treatment.  We did discuss the  general nature of a VATS procedure as well as potential complications.  He very strongly wants to follow this lesion and they understand that  there is no way to definitively say that this is not a low grade  malignancy and that there is some risk involved with continued follow-  up.  However, he is scheduled to undergo cardioversion next week and by  the time he has had that, and it would be safe to come off the Coumadin,  we would likely be at least halfway to his next scheduled scan in any  event.  Therefore per the patient's wishes we will repeat his scan in  August.  He is scheduled to have scans then for his pancreatic cancer  follow-up and we will make sure he gets a chest CT at that time.  If  there is any interval growth, then we would be more aggressive about  recommending surgical excision.  The patient and his wife demonstrate  good understanding of all issues involved.   Salvatore Decent [name] Fetch, M.D.  Electronically Signed   SCH/MEDQ  D:  06/12/2007  T:  06/12/2007  Job:  [id number]   cc:   Rose Phi. Myna Hidalgo, M.D.  Thora Lance, M.D.  Sigmund I. Patsi Sears, M.D.  Lorne Skeens. Hoxworth, M.D.

## 2010-06-22 NOTE — Assessment & Plan Note (Signed)
OFFICE VISIT   [name], [name]  DOB:  Jun 26, 1933                                        January 08, 2009  CHART #:  [id number]   The patient is a 77 year old gentleman who had a left superior  segmentectomy and node dissection in January of this year for stage IA  adenocarcinoma.  He is now about 9 months out from surgery.  He saw Dr.  Myna Hidalgo in October and had a CT of the chest at that time, which showed  no evidence of recurrent disease.  He now returns for his 7-month  followup visit.  At this point in time, he is doing well.  He is not  having any specific problems with breathing, the one thing that he  complains about is that he really has not had any energy or he has had a  lack of energy and he has general fatigue.  He says this dates back to  the surgery and he has never quite gotten over it.  He lost about 6-8  pounds and he has not been able to gain it back.  He recently was  started on testosterone to try that and see if that makes him feel any  better.   CURRENT MEDICATIONS:  1. Diovan HCT 80/12.5.  2. Digoxin 0.125 mg.  3. Diltiazem 240 mg daily.  4. Coumadin 5 mg daily.  5. Lipitor 10 mg 3 times weekly.  6. Levothyroxine 112 mcg daily.  7. Metformin 500 mg b.i.d.  8. Aspirin 81 mg b.i.d.  9. Creon 12 mg 3 times a day.  10.Multaq 400 mg twice a day.  11.Vitamin D.  12.Multivitamin.  13.AndroGel or Andriol.   ALLERGIES:  He has no known drug allergies.   FAMILY AND SOCIAL HISTORY:  Unchanged.   REVIEW OF SYSTEMS:  See HPI.  All other systems are negative.   PHYSICAL EXAMINATION:  The patient is a 77 year old gentleman in no  acute distress.  Vital Signs:  His blood pressure is 122/68, pulse 58,  respirations are 16, and his oxygen saturation is 97% on room air.  General:  He is well developed, well nourished, appears in good health.  He is in no acute distress.  There is no cervical or subclavicular  adenopathy.  Lungs:  Clear  with equal breath sounds bilaterally.  Incisions are well healed.  Cardiac:  Regular rate and rhythm with  normal S1 and S2.   CT scan from October shows some postoperative changes on the left.  Otherwise, no evidence of disease.   IMPRESSION:  The patient is now about 10 months out from the superior  segmentectomy and node dissection for a stage IA nonsmall cell lung  cancer.  From a surgical standpoint, he is doing quite well.  He has no  evidence of recurrence.  His incisions are well healed.  He does not  have much pain.  His main concern is that he just feel like he has never  quite gotten back to full strength since the surgery.  I am not sure if  that is related to the surgery per se or whether that could be other  issues going on particularly with his history of heart disease and  hypothyroidism and things with that nature.  His lab tests for the most  part were normal.  He is not anemic.  He was recently started on  testosterone and that may help.  He did not have with him today thyroid  function testing, but he says that that has been tested recently and was  fine.  I did not make any medication changes on him today.  I will  certainly defer that to Dr. Valentina Lucks and Dr. Alanda Amass, but from my  standpoint, he is doing well.  I am going to plan to see him back in  about 6 months.  He has a CT scan scheduled at that time by Dr. Myna Hidalgo  and I will see him after that is done.   Salvatore Decent [name] Fetch, M.D.  Electronically Signed   SCH/MEDQ  D:  01/08/2009  T:  01/09/2009  Job:  [id number]   cc:   Rose Phi. Myna Hidalgo, M.D.  Thora Lance, M.D.

## 2010-06-22 NOTE — Discharge Summary (Signed)
[name], [name]              ACCOUNT NO.:  [id number]   MEDICAL RECORD NO.:  [id number]          PATIENT TYPE:  OUT   LOCATION:  XRAY                         FACILITY:  [facility]   PHYSICIAN:  Salvatore Decent. [name] Fetch, M.D.DATE OF BIRTH:  28-Jan-1934   DATE OF ADMISSION:  01/15/2008  DATE OF DISCHARGE:  01/15/2008                               DISCHARGE SUMMARY   FINAL DIAGNOSIS:  Left lung mass, positive for adenocarcinoma T1a N0 MX.   IN-HOSPITAL DIAGNOSIS:  Postoperative atrial fibrillation with history  of paroxysmal atrial fibrillation.   SECONDARY DIAGNOSES:  1. History of T3 N0 adenocarcinoma of ampulla of Vater test with local      resection.  2. Diabetes mellitus.  3. History of tobacco abuse.  4. Hypothyroidism.  5. History of atrial fibrillation, status post cardioversion.   IN-HOSPITAL OPERATIONS AND PROCEDURES:  A left video-assisted  thoracoscopic surgery with wedge resection, left lower lobe superior  segmentectomy, mediastinal lymph node sampling.   HISTORY AND PHYSICAL AND HOSPITAL COURSE:  [name] is a 77 year old  gentleman with a previous history of T3 N0 adenocarcinoma with ampulla  of Vater.  He had a Whipple procedure done approximately 5 years ago and  had been followed with CT scan.  In February 2008, he was found to have  a 9 x 8 mm nodule in the left lower lobe.  This had been present as  really as in August 2008.  After discussion, the patient like to follow  this with repeat CT scanning.  This has now increased in size to 11 x 13  mm.  The patient was advised to have this resection for diagnostic and  therapeutic purposes.  The patient was seen and evaluated by Dr.  [name] Fetch.  Dr. [name] Fetch discussed with the patient risks and  benefits of undergoing this procedure.  The patient acknowledged  understanding and agreed to proceed.  Surgery was scheduled for March 05, 2008.  For details of the patient's past medical history and  physical  exam, please see dictated H and P.   The patient was taken to the operating room on March 05, 2008, where  he underwent left video-assisted thoracoscopic surgeries with wedge  resection, left lower lobe superior segmentectomy, mediastinal lymph  node sampling.  The patient tolerated this procedure well and  transferred to the Intensive Care Unit in stable condition.  Postoperatively, the patient was noted to be hemodynamically stable.  He  was extubated following surgery.  Postextubation, the patient was noted  to be alert and oriented x4, neuro intact.  Postoperatively from a  pulmonary standpoint, daily chest x-ray is obtained.  The patient did  have a small air leak from his chest tube postop day 2.  The patient's  chest tube has been placed to water seal.  Followup chest x-ray has  remained stable.  The patient's drainage from chest tube was able to  discontinued on postop day 4.  We will check a p.r.n. chest x-ray and  a.m. for followup.  The patient was working on his incentive spirometer  postoperatively.  He was able to be  weaned off oxygen, setting greater  than 90% on room air.  On postop day 2, the patient went into atrial  fibrillation.  He did have a history of paroxysmal atrial fibrillation,  status post cardioversion.  Cardiology was consulted.  The patient was  seen and started on IV Cardizem and then switched to p.o. Cardizem and  digoxin.  The patient was tentatively scheduled for cardioversion if  remained in atrial fibrillation.  He has restarted on his Coumadin.  The  patient did not require cardioversion secondary to converting to normal  sinus rhythm.  He remained on Cardizem and digoxin.  Daily PT/INRs were  obtained and Coumadin was suggested appropriately.  Otherwise  postoperatively, the patient continued to progress well.  Vital signs  were monitored closely.  He remained afebrile.  Blood pressure is  stable.  All incisions were noted to be clean, dry, and  intact and  healing well.  The patient is diabetic and blood sugars were followed  closely.  He was started back on his metformin.  Blood sugars stabilized  with restart of this medication.  The patient was up ambulating well  with assistance.  He was tolerating Diovan.  No nausea or vomiting  noted.   On March 09, 2008, the patient continued to progress well.  He is  afebrile.  Normal sinus rhythm in the 70s.  Blood pressure is stable.  He is sating greater than 90% on room air.  Last lab work obtained  showed sodium of 137, potassium 3.7, chloride of 101, bicarbonate 31,  BUN of 10, creatinine 1.14, and glucose of 157.  INR is 1.7.  White  blood cell count 7.2, hemoglobin 12.4, hematocrit 37.4, and platelet  count 123.  The patient is tentatively ready for discharge home in the  a.m. March 10, 2008, for postop day 5.  Pending his chest x-ray is  stable and he remains on normal sinus rhythm.   FOLLOWUP APPOINTMENT:  Followup appointment will be arranged with Dr.  [name] Fetch in 2 weeks.  Our office will contact the patient with this  information.  The patient will need to obtain PA and lateral chest x-  rays 30 minutes prior to this appointment.  The patient will also need  to follow up with our nurse for suture removal in 1 week.  Our office  will contact the patient with this information.  The patient will need  to follow up with Dr. Elsie Irwin as directed.  He will need to contact this  office to make these arrangements.   ACTIVITY:  The patient was instructed no driving until he is released to  do so.  No heavy lifting over 10 pounds.  He is still to ambulate 3-4  times per day, progress as tolerated, and to continue his breathing  exercises.   INCISIONAL CARE:  The patient was told to shower, washing his incisions  using soap and water.  He is to contact the office if he develops any  drainage or opening from any of his incision sites.   DIET:  The patient is to begin on diet  to be low fat, low salt.   DISCHARGE MEDICATIONS:  1. Coumadin 5 mg daily.  2. Lisinopril 20 mg daily.  3. Hydrochlorothiazide 12.5 mg daily.  4. Metformin 500 mg b.i.d.  5. Lipitor 10 mg 4 times per week.  6. Creon 10 mg b.i.d.  7. Levothroid 112 mg daily.  8. Multaq 400 mg b.i.d.  9. Digoxin 0.125  mg daily.  10.Cardizem 240 mg daily.  11.Percocet 5/325 one to two tabs q.4-6 h. p.r.n.      Theda Belfast, PA      Salvatore Decent. [name] Fetch, M.D.  Electronically Signed    KMD/MEDQ  D:  03/09/2008  T:  03/10/2008  Job:  [id number]   cc:   Salvatore Decent. [name] Fetch, M.D.  Madaline Savage, M.D.  Sigmund I. Patsi Sears, M.D.  Dr. Carlene Coria T. Hoxworth, M.D.  Rose Phi. Myna Hidalgo, M.D.

## 2010-06-22 NOTE — Assessment & Plan Note (Signed)
OFFICE VISIT   [name], [name]  DOB:  [birth year]                                        Jun 11, 2009  CHART #:  [id number]   HISTORY:  The patient is a 77 year old gentleman who had a left superior  segmentectomy and lymph node dissection for a stage IA adenocarcinoma in  January 2010.  He was last seen in the office in December 2010 for his 1-  year followup, at which time he was doing well.  He now returns for  repeat followup visit.  He had a CT scan done in March, which Dr.  Myna Hidalgo told him was okay.  The only new event since his last office  visit is he was diagnosed with polymyositis.  He was started on  prednisone 15 mg a day and had essentially immediate relief of those  symptoms from that.  He says he still does not have a lot of energy,  which he dates back to the time of his thoracotomy.  His other complaint  is that he feels some epigastric discomfort about 2 hours after eating.   CURRENT MEDICATIONS:  1. Metformin 500 mg b.i.d.  2. Coumadin 5 mg daily.  3. Levoxyl 125 mcg daily.  4. Pancreatic enzymes.  5. Lipitor 10 mg 3 times weekly.  6. Multaq 400 mg b.i.d.  7. Digoxin 0.125 mg daily.  8. Diovan 80 mg daily.  9. Multivitamin daily.  10.Prednisone 15 mg daily.  11.Glimepiride 2 mg daily.  12.Calcium and vitamin D 1 tablet daily.   ALLERGIES:  He has no known drug allergies.   PHYSICAL EXAMINATION:  The patient is a 77 year old well-appearing  gentleman, in no acute distress.  His blood pressure is 131/77, pulse  64, respirations are 18.  His oxygen saturation is 98% on room air.  His  lungs are clear with equal breath sounds bilaterally.  There is no  cervical or supraclavicular adenopathy.  His thoracotomy and chest tube  sites are well healed.   DIAGNOSTIC TESTS:  CT of the chest is reviewed.  This was done on April 22, 2009 that shows some scarring from his previous surgery, but no  evidence of recurrent disease.   IMPRESSION:  The patient is a 77 year old gentleman who is doing well  from the standpoint of his resection for lung cancer.  He has had no  evidence of recurrent disease.  He is now about 16 months out from his  surgery.  He has another appointment with Dr. Myna Hidalgo with a CT scan in  August.  I will just plan to see him back in January of next year, which  will be a 2-year followup visit and schedule a CT of the chest at that  time.  Of course, if Dr. Myna Hidalgo has any concerns about his exam or CT  findings in his office visit, I would be happy to see him, but we will  try to avoid duplicating efforts on office visits.   Regarding his epigastric discomfort with this previous Whipple, this  sounds like it could be bile reflux-type issue, it does not sound like  dumping syndrome, but I did encourage him to consider discussing that  further with Dr. Valentina Irwin.   Salvatore Decent [name], M.D.  Electronically Signed   SCH/MEDQ  D:  06/11/2009  T:  06/11/2009  Job:  [id number]   cc:   Rose Phi. Myna Hidalgo, M.D.  Thora Lance, M.D.

## 2010-06-22 NOTE — Op Note (Signed)
[name], [name]              ACCOUNT NO.:  [id number]   MEDICAL RECORD NO.:  [id number]          PATIENT TYPE:  INP   LOCATION:  3308                         FACILITY:  [facility]   PHYSICIAN:  Salvatore Decent. [name] Fetch, M.D.DATE OF BIRTH:  1933/10/19   DATE OF PROCEDURE:  03/05/2008  DATE OF DISCHARGE:                               OPERATIVE REPORT   PREOPERATIVE DIAGNOSIS:  A 1.3-cm nodule, left lower lobe.   POSTOPERATIVE DIAGNOSIS:  Adenocarcinoma.   PROCEDURES:  Left video-assisted thoracoscopic surgery wedge resection,  left lower lobe superior segmentectomy, mediastinal lymph node sampling.   SURGEON:  Salvatore Decent. [name] Fetch, MD   ASSISTANT:  Rowe Clack, PA-C   ANESTHESIA:  General.   FINDINGS:  Approximately 1-cm mass, superior segment of left lower lobe.  Frozen section adenocarcinoma, unable to differentiate metastatic versus  primary.  Nodes all appeared normal, sent for permanent sections only.   CLINICAL NOTE:  [name] is a 77 year old gentleman with a previous  history of T3, N0, adenocarcinoma with the ampulla of Vater.  He had a  Whipple procedure done approximately 5 years ago and had been followed  with CT scans.  In February 2008, he was found to have a 9 x 8 mm nodule  in the left lower lobe.  This had been present as early as in August  2008.  After discussion, the patient elected to follow this with repeat  CT scanning.  This has now increased in size to 11 x 13 mm.  The patient  was advised to have this resected for diagnostic and therapeutic  purposes.  He understood the indications, risks, benefits, and  alternatives and agreed to proceed.   OPERATIVE NOTE:  [name] was brought to the preop holding area on  March 05, 2008.  The Anesthesia Service placed an arterial blood  pressure monitoring line and a central venous catheter.  Intravenous  antibiotics were administered.  PAS hose were placed for DVT  prophylaxis.  The patient was taken to  the operating room, anesthetized,  and intubated.  A Foley catheter was placed.  He was intubated with a  double-lumen endotracheal tube.  The patient was placed in a right  lateral decubitus position and single lung ventilation of the right lung  was carried out and the patient tolerated this well throughout the  procedure.  The left chest was prepped and draped in the usual sterile  fashion.  Incision was made in the mid axillary line in approximately  seventh intercostal space and was carried through the skin and  subcutaneous tissue.  Hemostasis was achieved with cautery.  A hemostat  was inserted into the chest and then a port was inserted and the video  thoracoscope was placed through the port.  There were no significant  adhesions.  There was no pleural effusion.  There were no visible  pleural nodules on either the visceral or parietal pleura.   Additional port incision was made posteriorly just below the tip of the  scapula.  A small utility incision was made of approximately 6 cm in  length anteriorly for instrumentation.  The lungs were inspected.  There  was no palpable nodule on the lingula.  There was a palpable nodule in  the posterior aspect of the superior segment of the lower lobe.  The  inferior pulmonary ligament was divided with electrocautery.  The  pleural reflection was incised posteriorly.  The venous anatomy was  identified.  The nodule was grasped and a wedge resection was performed  with an Echelon 60 stapler using green cartridge to perform wedge  resection, removing the nodule.  This was sent for frozen section and it  subsequently returned with adenocarcinoma, it was unclear if this was  metastatic or primary.   Previous discussions with the patient for the purpose of this operation,  we are treating this as a primary lung cancer, but with a limited  parenchymal sparing resection.  Therefore, a superior segmentectomy was  performed.  The fissure was  relatively complete, so the dissection was  continued from posteriorly, and a stapler was fired across the base of  the superior segment preserving the basilar segmental venous drainage.  The segmental pulmonary artery branch was identified and divided.  The  remainder of the segmentectomy was completed with the sequential firings  of Echelon 60 stapler.  Lymph nodes then were dissected, a level 9  inferior pulmonary ligament node had been taken and sent as a separate  specimen during division of the inferior ligament.  Level 7 nodes were  dissected and sent as a separate specimen as were multiple level 10  nodes along the pulmonary veins.  The chest was copiously irrigated with  warm saline.  Hemostasis was achieved.  A 36-French right-angle chest  tube was placed posteriorly and toward the apex and secured with a #1  silk suture.  The scope was withdrawn if the lung was reinflated.  The  chest tube was placed to a Pleur-Evac.  The remaining incisions were  closed with 0 Vicryl fascial suture followed by 3-0 Vicryl subcuticular  sutures.  The patient is placed back in the supine position.  He was  then extubated in the operating room and taken to the recovery room in  good condition.      Salvatore Decent [name] Fetch, M.D.  Electronically Signed     SCH/MEDQ  D:  03/06/2008  T:  03/07/2008  Job:  [id number]   cc:   Rose Phi. Myna Hidalgo, M.D.  Madaline Savage, M.D.  Thora Lance, M.D.  Sigmund I. Patsi Sears, M.D.  Lorne Skeens. Hoxworth, M.D.

## 2010-06-22 NOTE — Assessment & Plan Note (Signed)
OFFICE VISIT   [name], [name]  DOB:  [birth year]                                        April 03, 2008  CHART #:  [id number]   The patient is a 77 year old gentleman who underwent a left superior  segmentectomy and node dissection on March 05, 2008, what turned out  to be a stage IA adenocarcinoma of the lung.  He had a previous history  of adenocarcinoma of the ampulla of Vater, which she had resected 4  years ago, but the stain was consistent with a lung primary rather than  GI primary.  He has seen Dr. Myna Hidalgo.  No additional therapy was  recommended given his stage IA.  The patient complains of cough.  He has  initially had a productive cough for the first couple of weeks and then  he had a persistent cough thereafter.  He had stopped his lisinopril as  he very back it caused a cough and his symptoms have improved, but have  not resolved since he stopped at about a week ago.  He is not having to  taking any pain medication.  He has been driving.  He has been jogging.   PHYSICAL EXAMINATION:  GENERAL:  The patient is a 77 year old gentleman,  in no acute distress.  VITAL SIGNS:  His blood pressure is 145/80, pulse 68, and respirations  are 18.  His ox saturation is 98% on room air.  LUNGS:  Clear.  His incisions are well healed.   Chest x-ray shows resolving postoperative changes in the left base.  There is no significant effusion.   IMPRESSION:  The patient is doing very well at this point in time.  He  is about a month out from surgery as well.  I had a most people in terms  of his activity levels and pain tolerance.  It is okay for him to drive.  His activities essentially are unrestricted, but I advised him to build  into his activities gradually and not try and overdo it right after the  bath as he still may have some soreness as he starts to be more active.  He has got a CT scan scheduled for Jun 09, 2008,  through Dr. Gustavo Lah office.   I will plan to see him back shortly after  that.  We did discuss the need to follow this over time with repeated  scans for a total of 5 years.   Salvatore Decent [name] Fetch, M.D.  Electronically Signed   SCH/MEDQ  D:  04/03/2008  T:  04/03/2008  Job:  [id number]   cc:   Thora Lance, M.D.  Rose Phi. Myna Hidalgo, M.D.

## 2010-06-22 NOTE — Assessment & Plan Note (Signed)
OFFICE VISIT   BURNETT, SPRAY  DOB:  Sep 05, 1933                                        January 18, 2008  CHART #:  [id number]   REASON FOR VISIT:  Followup of lung nodule.   HISTORY OF PRESENT ILLNESS:  The patient is a 77 year old gentleman, who  has been followed for a left lung nodule.  He has a history of a stage  III T3, N0 adenocarcinoma of the ampulla of Vater.  He had a Whipple  procedure done about 5 years ago and has had no evidence of recurrent  disease.  He has been followed with CT scans since that time.  In  February 2008, he had a CT scan which showed a 9 x 8-mm nodule in the  left lower lobe.  Looking back on a scan in August 2008, it was in 7.6  cm.  We discussed potentially resecting versus continuing to follow this  lesion.  He had a PET scan, which showed no evidence of hypermetabolic  activity.  He was having atrial fibrillation and was scheduled for  cardioversion around the time of the visit in May and elected to follow  up with repeat CT scanning.  A CT was done in August 2008, which showed  an increase in size with a maximal diameter about 1 cm and he was seen  by Dr. Myna Hidalgo at that time.  He now returns and has been further  increase in size, which was 10.3 x 11.5 to my measurement.  Radiology  measured it at 11 x 13 mm.  There is a small 3-mm lesion in the lingula  which has not changed on any other scan.   He has had no symptomatic changes.  He has chronic shortness of breath  with exertion which he has had for about 40 years.  He states he was  told by Dr. Myna Hidalgo this morning that he was still in atrial  fibrillation.  He has not seen Dr. Elsie Lincoln recently.  He denies any  wheezing, cough, hemoptysis, or weight loss.  Of note, his scans of his  abdomen and pelvis showed no evidence of recurrent disease.   His current medications are metformin 500 mg b.i.d., Coumadin daily,  Levoxyl 112 mcg daily, pancreatic enzymes,  lisinopril 20/12.5 one tablet  daily, and Lipitor 10 mg every other day.   He has no known drug allergies.  Please refer to the patient medical  history form and previous dictations for family history and social  history.  Of note, he has a past history of smoking, but quit 35 years  ago.   REVIEW OF SYSTEMS:  All systems are negative except as noted in the HPI.   Physical exam is unchanged from May 2009.   His CT scan is reviewed, findings are as previously noted.   IMPRESSION:  The patient is a 77 year old gentleman.  He has a remote  history 5 years ago of cancer of the ampulla of Vater.  He had a Whipple  resection.  He has had no evidence of recurrent disease.  He has a left  lung nodule, which has been present only since 2007 and has been slowly  growing over that time.  It is now greater than a centimeter in  diameter.  Given its appearance, it needs to be  resected.  He had a PET  scan back in April or May, which showed no increased metabolic activity.  The lesion was less than a centimeter at that time.  I think now  regardless of any PET findings, the lesion would need to come out as it  definitely increased in size, although my measurements are slightly  smaller than what Radiology found.  I had a long discussion  approximately 45 minutes with the patient and his wife regarding the  findings of the scan.  Their implications basically as it turns out with  this lesion growing in size, malignancy cannot be ruled out.  We did  discuss potential for needle biopsy.  However, because of the  significant risk for a false negative, I would not recommend a needle  biopsy.  If it is positive, it would need to be resected.  If it was  negative, I would still recommend that this lesion to be resected  because of the growth and size.  We also discussed the operative  approach which would be a left VATS, wedge resection followed by frozen  section.  If this turn out to be a primary lung  cancer, then proceed  with either segmentectomy or lobectomy depending on intraoperative  findings and anatomy.   They had many questions regarding this portion of the discussion, we  went through those in detail.  I did discuss with him the nature and  extend of the operation and need for general anesthesia, expected  postoperative hospital stay, and overall recovery.  We did discuss the  potential risks and benefits.  They understand the risks include but not  limited to death, myocardial infarction, stroke, deep vein thrombosis,  pulmonary embolism, bleeding, possible need for transfusions, infections  as well as other organ system dysfunction.  He understand that expected  hospital stay would be about 4-5 days assuming that there are no  significant complications.   I discussed the timing of surgery, did offer to try and do this next  week before the holidays.  The other option would be to do it on  February 04, 2008, between [place] and [place].  They do have family  coming into town and he also has a trip to [place] planned in early  January and after discussion he wants to proceed with surgery in late  January.  I will be in [facility] early January.  We will plan to do it  in the last week of January, Wednesday, March 05, 2008.  I think as  long as this lesion has been there with no more rapid growth than it had  shown, there is no particular risk to wait another month to do the  operation.   I did discuss with him that we will need to have him off of Coumadin  prior to surgery.  I will discuss with Dr. Elsie Lincoln whether we can just  stop the Coumadin and have him come in for surgery or whether he will  need to come in and be bridged with heparin as his Coumadin wears off.  We will plan to proceed with surgery on Wednesday, March 05, 2008.   Salvatore Decent [name] Fetch, M.D.  Electronically Signed   SCH/MEDQ  D:  01/18/2008  T:  01/19/2008  Job:  [id number]   cc:   Rose Phi.  Myna Hidalgo, M.D.  Madaline Savage, M.D.  Thora Lance, M.D.  Sigmund I. Patsi Sears, M.D.  Lorne Skeens. Hoxworth, M.D.

## 2010-06-24 ENCOUNTER — Encounter (HOSPITAL_BASED_OUTPATIENT_CLINIC_OR_DEPARTMENT_OTHER): Payer: Medicare Other | Admitting: Hematology & Oncology

## 2010-06-25 NOTE — Op Note (Signed)
NAMETRIMAINE, MASER                        ACCOUNT NO.:  [id number]   MEDICAL RECORD NO.:  [id number]                   PATIENT TYPE:  AMB   LOCATION:  ENDO                                 FACILITY:  [facility]   PHYSICIAN:  Danise Edge, M.D.                DATE OF BIRTH:  Jun 04, 1933   DATE OF PROCEDURE:  09/17/2002  DATE OF DISCHARGE:                                 OPERATIVE REPORT   PROCEDURE PERFORMED:  Failed endoscopic retrograde cholangiopancreatography.   ENDOSCOPIST:  Charolett Bumpers, M.D.   INDICATIONS FOR PROCEDURE:  [name] is a 77 year old male born  1933/06/21.  Mr. Tagle has obstructive jaundice.  Abdominal ultrasound  revealed dilation of the intrahepatic bile ducts, extrahepatic bile ducts,  and pancreatic ductal system.  By ultrasound there appeared to be a tumor in  the head of the pancreas.   A CT angiogram was performed revealing biliary ductal dilation to the level  of the ampulla without pancreatic tumor.  By CT angiogram there appears to  be an ampullary tumor. There are no stones in the gallbladder or bile ducts.   ENDOSCOPIST:  Charolett Bumpers, M.D.   PREMEDICATION:  Unasyn 1.5g intravenously.  Demerol 50 mg intravenously.  Versed 10 mg intravenously.  Glucagon 1 mg intravenously.   DESCRIPTION OF PROCEDURE:  After obtaining informed consent, Mr. Branan  was placed in the left lateral decubitus position.  I administered  intravenous Demerol and intravenous Versed to achieve conscious sedation for  the procedure.  The patient's blood pressure, oxygen saturations and cardiac  rhythm were monitored throughout the procedure and documented in the medical  record.   The Olympus therapeutic duodenoscope was passed through the posterior  hypopharynx into the proximal esophagus without difficulty.  The endoscope  was advanced down the esophagus and into the proximal stomach without  examining the esophagus.  The stomach was not  examined.  The endoscope was  passed through the pylorus and into the duodenal bulb without examining the  duodenum or duodenal bulb.   Major papilla:  There is a large non ulcerated soft tumor associated with  the major papilla in the second portion of the duodenum.  In the distal  second portion of the duodenum, I did locate the ampulla.  There was no  ulceration.  I was unable to intubate the bile duct or the pancreatic duct.  Biopsies were taken to rule out a neoplastic process.   ASSESSMENT:  Obstructive jaundice with dilation of both the pancreatic duct  and the biliary ductal system secondary to a tumor involving the major  papilla.  I was unable to access the major papilla endoscopically.  Biopsies  are pending.   Mr. Norrod may require a percutaneous transhepatic approach to access his  biliary ductal system unless the biopsies performed today return cancerous  in which case, I will send him for surgery.  Danise Edge, M.D.    MJ/MEDQ  D:  09/17/2002  T:  09/17/2002  Job:  [id number]

## 2010-06-25 NOTE — Op Note (Signed)
[name], Irwin                        ACCOUNT NO.:  [id number]   MEDICAL RECORD NO.:  [id number]                   PATIENT TYPE:  INP   LOCATION:  2314                                 FACILITY:  [facility]   PHYSICIAN:  Sharlet Salina T. Hoxworth, M.D.          DATE OF BIRTH:  10-01-1933   DATE OF PROCEDURE:  10/09/2002  DATE OF DISCHARGE:                                 OPERATIVE REPORT   PREOPERATIVE DIAGNOSIS:  Periampullary tumor with obstructive jaundice.   POSTOPERATIVE DIAGNOSIS:  Periampullary tumor with obstructive jaundice.   SURGICAL PROCEDURE:  Pancreaticoduodenectomy with feeding jejunostomy.   SURGEON:  Lorne Skeens. Hoxworth, M.D.   ASSISTANT:  Vikki Ports, M.D.   ANESTHESIA:  General.   BRIEF HISTORY:  [name] is a 77 year old white male otherwise  generally healthy who presents with painless obstructive jaundice.  He has  had an extensive workup, including endoscopy, ultrasound, CT scan, all of  which indicate an approximately 2.5 cm irregular hypodense mass at or  adjacent to the ampulla with obstruction of the bile duct and common bile  duct.  Endoscopy showed a submucosal mass at the ampulla.  All indications  are that this is a malignant periampullary tumor, and we have recommended  proceeding with resection with pancreaticoduodenectomy.  The nature of the  procedure, risks of bleeding, infection, anastomotic leak, possible risk of  death, were discussed extensively with the patient and his family.  He is  now brought to the operating room for this procedure.   DESCRIPTION OF OPERATION:  The patient was brought to the operating room and  placed in the supine position on the operating table, and general  endotracheal anesthesia was induced.  He received preoperative antibiotics.  PAS were in place.  The abdomen was sterilely prepped and draped widely.  Initially a right subcostal incision was used and dissection carried down  through the  subcutaneous tissue, the fascial and muscle layers using  cautery, and the peritoneum entered under direct vision.  There was no fluid  or obvious abnormalities on entering the abdomen.  The gallbladder was  tensely distended.  Exploration was carried out, carefully palpating the  porta hepatis, hepatic area, celiac area, and carefully examining the root  of the mesentery at the transverse colon, and there was no evidence of  locally-extensive tumor any enlarged lymph nodes.  The incision was extended  to a bilateral subcostal.  Initially the pancreas was widely exposed by  dividing the lesser omentum between clamps and tied with 2-0 silk ties and  widely opening the lesser sac and mobilizing the hepatic flexure of the  colon off the duodenum widely.  The pancreas was normal to slightly firm  throughout its course.  An extensive Kocher maneuver was then performed,  dividing the peritoneum lateral to the duodenum and reflecting the duodenum  and the head of the pancreas up to the left well over to on top of  the vena  cava.  The mass was palpable in the region of the ampulla but again no  evidence of extensive local disease or any enlarged lymph nodes.  At this  point the gallbladder was taken down retrograde with cautery dissecting down  to the cystic artery, which was divided between clips, and the cystic duct  completely mobilized down to the common bile duct.  The peritoneum overlying  the common bile duct was incised and using careful blunt dissection, the  common hepatic duct was encircled with a vessel loop above the entrance to  the cystic duct.  At this point dissection was carried along the hepatic  artery superior to the neck of the pancreas, sweeping lymphatic tissue down  toward the pancreas and carrying the dissection down onto the portal vein,  which was identified above the pancreas.  Following this the inferior border  of the pancreas of the neck was dissected and the superior  mesenteric vein  identified.  There was no evidence of tumor in this area, and the neck of  the pancreas was easily freed from the anterior border of the superior  mesenteric and portal veins and encircled with a vessel loop.  At this point  the stomach was dissected.  Choosing a point for resection at about the,  removing about one-third of the distal stomach, the greater and lesser  curves were cleared, doubly ligating the left gastric branch of the lesser  curve.  The stomach was then divided with a single firing of the TA-90  gastric stapler.  The specimen was then dissected to the patient's right,  mobilizing the distal stomach and pylorus.  At this point the common bile  duct was divided and dissected up off the portal vein, taking all the  lymphatic tissue lateral to the distal bile duct and portal vein down toward  the specimen.  The gastroduodenal artery was dissected free and its junction  with the hepatic artery clearly identified.  It was divided between two  proximal and one distal clamp and tied with 2-0 silk.  At this point the  duodenum and the head of the pancreas could be reflected over toward the  right and branches off the portal vein and the pancreas were individually  dissected free and controlled with clips or 4-0 silk ligatures.  The head of  the pancreas was extensively mobilized off of the portal vein and superior  mesenteric vein in this fashion.  At this point a point of proximal jejunum  at a mobile area in the mesentery was identified and divided with the GIA  stapler.  The ligament of Treitz was extensively mobilized, dividing  peritoneal attachments superiorly and laterally.  The mesentery of the  proximal jejunum staying directly on the bowel was then divided between  clamps and tied with 2-0 silk ties, working proximally until it was freed  from the superior mesenteric artery on the left side.  The proximal jejunum was then passed beneath the superior  mesenteric vessels and at this point  the specimen was attached only along the uncinate process and a few of the  very short superior mesenteric vessels to the forward portion of the  duodenum. Clamps were used to sequentially work through the uncinate process  and these mesenteric vessels along the right side of the superior mesenteric  artery, suture ligating proximally and ligating distally, working superior  to inferior until the specimen was completely freed and removed.  It was  then was  thoroughly irrigated with saline and hemostasis assured.  The  pancreatic stump was fairly small-sized with a nicely dilated pancreatic  duct measuring probably 6 mm or so in diameter.  It was fairly firm for  suturing.  The pancreatic stump was mobilized back a couple of centimeters  in preparation for anastomosis.  The end of the jejunum was brought under  the mesenteric vessels and up into the pancreatic bed.  A two-layer mucosa  to mucosa anastomosis was performed.  Initially 2-0 silk sutures were placed  posteriorly about 2 cm back from the edge of the pancreas into the  pancreatic tissue and seromuscularly in the posterior small bowel about 2 cm  back.  The staple line on the jejunum was then excised.  An inner row of  interrupted 4-0 silk sutures was used along the edge of the bowel to the  edge of the pancreas, incorporating the mucosa of the pancreatic duct to the  central four or five sutures.  Following this an anterior layer of 4-0 silk  was placed between the edge of the pancreas and the edge of the jejunum,  again incorporating the pancreatic duct with two or three sutures anteriorly  mucosa to mucosa and finally 2-0 silk sutures were used to invert the  pancreatic stump into the jejunum, suturing a couple of centimeters back  from the edge of the jejunum and pancreas.  The anastomosis appeared quite  secure and good viability to all the tissue.  Following this, about 10 cm  distal on  the small bowel an end-to-side choledochojejunostomy was created  with a posterior row of seromuscular external bile duct sutures of 3-0 silk,  followed by circumferential full-thickness 4-0 PDS and then a few anterior 3-  0 silk sutures.  The bile duct was very good-sized, about 15 mm.  Following  this the jejunum more distally was brought up in a retrocolic fashion  through an avascular area in the transverse mesocolon and a two-layer hand-  sewn gastrojejunostomy was created along the inferior aspect of the staple  line, removing about 4 cm of the inferior staple line.  This was  accomplished with a posterior row of seromuscular 2-0 silk sutures, followed  by full-thickness running locking 2-0 chromic suture circumferentially and  then the anterior row of seromuscular 2-0 silk sutures.  Following this a  feeding jejunostomy was placed more distally in the jejunum with a 12 French  red rubber catheter placed through a pursestring suture and then seromuscular Witzel sutures placed, inverting the catheter over several  centimeters, which was brought out through a stab wound in the anterior  abdominal wall and sutured securely to the anterior abdominal wall over  several centimeter length to avoid twisting.  The abdomen was again  irrigated and hemostasis assured.  Two closed-suction drains were placed,  one on either side of the abdomen, on the right going underneath the  choledochojejunostomy and lying just superior to the pancreaticojejunostomy.  On the left the drain was placed just inferior to the  pancreaticojejunostomy.  Following this, Tisseel was used 5 mL, completely  covering the pancreaticojejunostomy anastomosis.  The viscera were all  returned to their anatomic position.  The wound was closed with two layers  with a posterior layer of running 0 PDS and an anterior layer of running #1  PDS beginning at  either end of the incision and tied centrally.  The subcutaneous tissue  was  irrigated and the skin closed with staples.  Sponge,  needle, and instrument  counts were correct.  Pressure dressings were applied and the patient taken  to recovery in satisfactory condition.                                               Lorne Skeens. Hoxworth, M.D.    Tory Emerald  D:  10/09/2002  T:  10/10/2002  Job:  [id number]

## 2010-06-25 NOTE — Op Note (Signed)
   [name], PATEL                        ACCOUNT NO.:  [id number]   MEDICAL RECORD NO.:  [id number]                   PATIENT TYPE:  AMB   LOCATION:  ENDO                                 FACILITY:  [facility]   PHYSICIAN:  Danise Edge, M.D.                DATE OF BIRTH:  Jun 15, 1933   DATE OF PROCEDURE:  07/22/2002  DATE OF DISCHARGE:                                 OPERATIVE REPORT   PROCEDURE:  Colonoscopy.   INDICATIONS:  Mr. Stopa  is a 77 year old male, born [birth year].  Mr.  Trickett has intermittent painless hematochezia, large internal hemorrhoids,  and rectal mucosal prolapse.  He is scheduled for surgery to treat his  rectal mucosal prolapse and internal hemorrhoids this morning to be  performed by Dr. Kendrick Ranch.   ENDOSCOPIST:  Danise Edge, M.D.   PREMEDICATION:  Versed 5 mg, Demerol 70 mg.   DESCRIPTION OF PROCEDURE:  After obtaining informed consent, Mr. Friesen  was placed in the left lateral decubitus position.  I administered  intravenous Demerol and intravenous Versed to achieve conscious sedation for  the procedure.  The patient's blood pressure, oxygen saturation and cardiac  rhythm were monitored throughout the procedure and documented in the medical  record.   Anal inspection was normal.  Digital rectal revealed a nonnodular prostate.  Mr. Veras does have rectal mucosal prolapse with large internal  hemorrhoids.  The Olympus pediatric adjustable colonoscope was introduced  into the rectum and easily advanced to the cecum.  Colonic preparation for  the exam today was excellent.  Rectum:  Normal.  Sigmoid colon and descending colon:  Left colonic diverticulosis.  Splenic flexure:  Normal.  Transverse colon:  Normal.  Hepatic flexure:  Normal.  Ascending colon:  Normal.  Cecum and ileocecal valve:  Normal.    ASSESSMENT:  Left colonic diverticulosis; otherwise proctocolonoscopy to the  cecum except for the presence of rectal mucosal  prolapse and large internal  hemorrhoids.  No endoscopic evidence for the presence of colorectal  endoscopic neoplasia.                                               Danise Edge, M.D.    MJ/MEDQ  D:  07/22/2002  T:  07/22/2002  Job:  [id number]   cc:   Sheppard Plumber. Earlene Plater, M.D.  [address] [place]  [place] [address]  Fax: [phone]   Stan Head. Cleta Alberts, M.D.  [address]  [place]  [place] [address]  Fax: [phone]

## 2010-06-25 NOTE — Discharge Summary (Signed)
   [name], [name]                        ACCOUNT NO.:  [id number]   MEDICAL RECORD NO.:  [id number]                   PATIENT TYPE:  INP   LOCATION:  5710                                 FACILITY:  [facility]   PHYSICIAN:  Sharlet Salina T. Hoxworth, M.D.          DATE OF BIRTH:  Dec 19, 1933   DATE OF ADMISSION:  10/09/2002  DATE OF DISCHARGE:  10/25/2002                                 DISCHARGE SUMMARY   DISCHARGE DIAGNOSIS:  Adenocarcinoma of the ampulla of Vater.   PROCEDURES:  Pancreaticoduodenectomy with feeding jejunostomy tube on  October 09, 2002.   HISTORY OF PRESENT ILLNESS:  DICTATION ENDS HERE.                                                Lorne Skeens. Hoxworth, M.D.    Tory Emerald  D:  11/18/2002  T:  11/18/2002  Job:  [id number]

## 2010-06-25 NOTE — Discharge Summary (Signed)
NAMESEICHI, KAUFHOLD                        ACCOUNT NO.:  [id number]   MEDICAL RECORD NO.:  [id number]                   PATIENT TYPE:  INP   LOCATION:  5710                                 FACILITY:  [facility]   PHYSICIAN:  Sharlet Salina T. Hoxworth, M.D.          DATE OF BIRTH:  Aug 21, 1933   DATE OF ADMISSION:  10/09/2002  DATE OF DISCHARGE:  10/25/2002                                 DISCHARGE SUMMARY   DISCHARGE DIAGNOSES:  Adenocarcinoma of the ampulla of Vader.   SURGICAL PROCEDURE:  Pancreaticoduodenectomy, Sharlet Salina T. Hoxworth, M.D.  October 09, 2002.   HISTORY OF PRESENT ILLNESS:  Mr. Gutman is a 77 year old white male who  recently presented with painless jaundice.  He has had a work-up including  an ultrasound showing dilated intra and extrahepatic ducts with a  questionable mass at the ampulla of Vader.  CT scan has confirmed an  approximately 2 cm mass which appears to arise from the ampulla.  He has had  an endoscopy showing bulging of the ampulla covered with normal mucosa.  With these findings, he is suspected to have a periampullary carcinoma and  exploration with planned pancreaticoduodenectomy has been recommended.  The  procedure and risks were discussed extensively preoperatively and he is  admitted for this procedure.   PAST MEDICAL HISTORY:  He has been followed for hypertension and elevated  cholesterol and hypothyroidism.  He has had no previous significant surgical  procedures or major illnesses.   MEDICATIONS:  1. Lipitor 10 mg daily.  2. Monopril 20 mg daily.  3. Synthroid 100 mcg daily.   ALLERGIES:  No known allergies.   PHYSICAL EXAMINATION:  GENERAL:  Fit appearing white male, obviously  jaundiced.  ABDOMEN:  Soft, nontender without masses.   HOSPITAL COURSE:  The patient was admitted on morning of his procedure and  underwent an uneventful pancreaticoduodenectomy.  There was a firm mass in  the area of the ampulla, but no evidence of  metastatic disease on careful  exploration.  He tolerated procedure well.  He was initially observed in the  intensive care unit.  Vital signs and hemoglobin remained stable and he was  alert and comfortable on the first postoperative day.  He was somewhat  hyperglycemic and was started on sliding scale insulin and he was begun on  jejunal tube feedings on the first postoperative day.  He did have some  third spacing of fluids requiring some fluid boluses, but continued to  remain stable.  On the second postoperative day hemoglobin was decreased to  8.7 and BUN and creatinine were up slightly at 29 and 2.0 with good urine  output.  He was given further IV fluids and tube feeding.  On the third  postoperative day hemoglobin was decreased to 7.4.  He remained stable.  Bilirubin was down to 6.4.  He did not receive a transfusion.  Hemoglobin on  the fourth postoperative day was increased to  7.7.  Final pathology returned  showing a well differentiated adenocarcinoma of the ampulla of Vader 2.6 cm  in diameter with invasion into the pancreatic parenchyma, but negative  margins and all lymph nodes negative staging at T4 N0 M0 tumor.  His NG tube  was discontinued on the fourth postoperative day and he was advanced to full  jejunal tube feedings.  He was able to be transferred to the floor at this  time.  Has been on a clear liquid diet which he tolerated well and diet was  steadily advanced.  [facility] were consulted for management of his  hyperglycemia and he was started on NPH insulin for control.  Hemoglobin  remained stable at about 7.6, but was noted to be down to 7.2 on  postoperative day #8.  He was given erythropoietin and iron at that time.  His strength and p.o. intake steadily increased.  He had some persistent  serosanguinous drainage from his JP drains but this did not show any  evidence of amylase or enteric content.  He did have some moderately slow  gastric emptying  resulting in some nausea and delay in taking a full diet  but this gradually improved.  By September 13 his hemoglobin was increased  to 8.5.  Bilirubin was down to 3.6.  Continued to make process and his JP  drainage tapered off.  These were able to be removed.  Hemoglobin drifted  down somewhat to 7.7.  On September 15 he was given another dose of Procrit.  By September 16 he was eating well, having regular bowel movements.  Abdomen  was completely benign.  He was switched to nocturnal supplemental tube  feedings which will be continued at home.  He is discharged home on  October 25, 2002.  Wound is primarily healed and his staples removed.  All  drains removed.  Tolerating a regular diet.   DISCHARGE MEDICATIONS:  Same as admission plus Lantus insulin 20 units  subcutaneous daily.   FOLLOWUP:  Follow-up is to be in my office in two to three weeks and  oncology opinion will be obtained as an outpatient.                                                Lorne Skeens. Hoxworth, M.D.    Tory Emerald  D:  11/18/2002  T:  11/18/2002  Job:  [id number]   cc:   Danise Edge, M.D.  [address]  [place]  [place] [address]  Fax: [phone]

## 2010-06-25 NOTE — Op Note (Signed)
NAME:  DELOYD, HANDY                        ACCOUNT NO.:  [id number]   MEDICAL RECORD NO.:  [id number]                   PATIENT TYPE:  AMB   LOCATION:  DAY                                  FACILITY:  [facility]   PHYSICIAN:  Timothy E. Earlene Plater, M.D.              DATE OF BIRTH:  [birth year]   DATE OF PROCEDURE:  07/22/2002  DATE OF DISCHARGE:                                 OPERATIVE REPORT   PREOPERATIVE DIAGNOSIS:  Internal hemorrhoids with prolapse.   POSTOPERATIVE DIAGNOSIS:  Internal hemorrhoids with prolapse.   PROCEDURE:  PPH anopexy, complex hemorrhoidectomy.   SURGEON:  Timothy E. Earlene Plater, M.D.   ANESTHESIA:  General.   Mr. Illescas is 36, considers himself otherwise well. He has developed  mucosal prolapse syndrome with large internal hemorrhoids. He has tried all  conservative management including improvement in bowel habits and the  problem persists. We have arranged for a colonoscopy today which has been  done and was essentially negative. He was then brought to the operating  room, evaluated, identified and the permit signed.   He was taken to the operating room on the gurney, general endotracheal  anesthesia was carefully administered. He was then turned into the prone  position, very carefully padded and positioned. The table was flexed,  buttocks were cleansed and then taped apart. A sterile prep was  accomplished. The anus was gently dilated, anoscopy carried out showing  redundant anorectal mucosa with easy bleeding. The anus was injected about  with 0.25% Marcaine 30 mL mixed with 1 mL of Wydase. A total of 28 mL  injected for wide field block. This was massaged in well. Then with the long  operating anoscope in place, a pursestring suture of 2-0 Prolene was placed  approximately 4 cm proximal to the dentate line in a circumferential fashion  with sewing of the mucosa only. The long scope was removed, the PPH anoscope  was inserted and then the PPH staple device  fully extracted was placed into  the anoscope through the pursestring suture which was then pulled up snug  and tied. Then with careful application, the PPH staple device was closed  and fired and removed. The rectum was observed for a total of 15 minutes.  The staple line was approximately 1-2 cm above the dentate line. There were  several small areas of bleeding but these were tiny. Figure-of-eight sutures  of 4-0 Vicryl were used at each location for this bleeding. The strip of  mucosa was inspected and then sent to the lab. On final inspection, all  bleeding had completely stopped. The rectum was dressed with Gelfoam gauzes  and then an external dressing. Again he tolerated it well, he was turned  back into the prone position, extubated and taken to the recovery room in  good condition.   Written and verbal instructions were carefully given to him and his wife  including Percocet 5 mg #  36 and he will be followed in the office.                                              Timothy E. Earlene Plater, M.D.   TED/MEDQ  D:  07/22/2002  T:  07/22/2002  Job:  [id number]   cc:   [facility]

## 2010-08-24 ENCOUNTER — Other Ambulatory Visit: Payer: Self-pay | Admitting: Gastroenterology

## 2010-08-26 ENCOUNTER — Other Ambulatory Visit: Payer: Self-pay | Admitting: Hematology & Oncology

## 2010-08-26 ENCOUNTER — Encounter (HOSPITAL_BASED_OUTPATIENT_CLINIC_OR_DEPARTMENT_OTHER): Payer: Medicare Other | Admitting: Hematology & Oncology

## 2010-08-26 DIAGNOSIS — Z7901 Long term (current) use of anticoagulants: Secondary | ICD-10-CM

## 2010-08-26 DIAGNOSIS — C3430 Malignant neoplasm of lower lobe, unspecified bronchus or lung: Secondary | ICD-10-CM

## 2010-08-26 DIAGNOSIS — R000 Tachycardia, unspecified: Secondary | ICD-10-CM

## 2010-08-26 DIAGNOSIS — Z8509 Personal history of malignant neoplasm of other digestive organs: Secondary | ICD-10-CM

## 2010-08-26 DIAGNOSIS — D649 Anemia, unspecified: Secondary | ICD-10-CM

## 2010-08-26 DIAGNOSIS — D509 Iron deficiency anemia, unspecified: Secondary | ICD-10-CM

## 2010-08-26 LAB — CBC WITH DIFFERENTIAL (CANCER CENTER ONLY)
BASO%: 0.7 % (ref 0.0–2.0)
EOS%: 4.4 % (ref 0.0–7.0)
LYMPH%: 17.1 % (ref 14.0–48.0)
MCHC: 34 g/dL (ref 32.0–35.9)
MCV: 90 fL (ref 82–98)
MONO#: 0.7 10*3/uL (ref 0.1–0.9)
NEUT%: 66.4 % (ref 40.0–80.0)
Platelets: 126 10*3/uL — ABNORMAL LOW (ref 145–400)
RDW: 16.7 % — ABNORMAL HIGH (ref 11.1–15.7)
WBC: 6.1 10*3/uL (ref 4.0–10.0)

## 2010-08-26 LAB — CHCC SATELLITE - SMEAR

## 2010-08-27 LAB — RETICULOCYTES (CHCC): ABS Retic: 48.3 10*3/uL (ref 19.0–186.0)

## 2010-08-27 LAB — FERRITIN: Ferritin: 77 ng/mL (ref 22–322)

## 2010-08-27 LAB — IRON AND TIBC
Iron: 43 ug/dL (ref 42–165)
TIBC: 283 ug/dL (ref 215–435)
UIBC: 240 ug/dL

## 2010-09-15 ENCOUNTER — Encounter (HOSPITAL_BASED_OUTPATIENT_CLINIC_OR_DEPARTMENT_OTHER): Payer: Medicare Other | Admitting: Hematology & Oncology

## 2010-09-15 DIAGNOSIS — D649 Anemia, unspecified: Secondary | ICD-10-CM

## 2010-10-19 ENCOUNTER — Encounter (HOSPITAL_BASED_OUTPATIENT_CLINIC_OR_DEPARTMENT_OTHER): Payer: Medicare Other | Admitting: Oncology

## 2010-10-19 ENCOUNTER — Other Ambulatory Visit: Payer: Self-pay | Admitting: Hematology & Oncology

## 2010-10-19 DIAGNOSIS — C3430 Malignant neoplasm of lower lobe, unspecified bronchus or lung: Secondary | ICD-10-CM

## 2010-10-19 LAB — CBC & DIFF AND RETIC
BASO%: 0.5 % (ref 0.0–2.0)
Basophils Absolute: 0 10*3/uL (ref 0.0–0.1)
EOS%: 8.6 % — ABNORMAL HIGH (ref 0.0–7.0)
HCT: 40.7 % (ref 38.4–49.9)
HGB: 13.5 g/dL (ref 13.0–17.1)
Immature Retic Fract: 7.4 % (ref 3.00–10.60)
LYMPH%: 24.1 % (ref 14.0–49.0)
MCH: 30.3 pg (ref 27.2–33.4)
MCHC: 33.2 g/dL (ref 32.0–36.0)
MONO#: 0.5 10*3/uL (ref 0.1–0.9)
NEUT%: 57.3 % (ref 39.0–75.0)
Platelets: 129 10*3/uL — ABNORMAL LOW (ref 140–400)
lymph#: 1.4 10*3/uL (ref 0.9–3.3)

## 2010-10-19 LAB — COMPREHENSIVE METABOLIC PANEL
CO2: 29 mEq/L (ref 19–32)
Creatinine, Ser: 1.02 mg/dL (ref 0.50–1.35)
Glucose, Bld: 133 mg/dL — ABNORMAL HIGH (ref 70–99)
Total Bilirubin: 0.5 mg/dL (ref 0.3–1.2)

## 2010-10-19 LAB — IRON AND TIBC
Iron: 97 ug/dL (ref 42–165)
TIBC: 262 ug/dL (ref 215–435)
UIBC: 165 ug/dL (ref 125–400)

## 2010-10-20 ENCOUNTER — Encounter (HOSPITAL_COMMUNITY): Payer: Self-pay

## 2010-10-20 ENCOUNTER — Ambulatory Visit (HOSPITAL_COMMUNITY)
Admission: RE | Admit: 2010-10-20 | Discharge: 2010-10-20 | Disposition: A | Discharge status: Home / Self Care | Payer: Medicare Other | Source: Ambulatory Visit | Attending: Hematology & Oncology | Admitting: Hematology & Oncology

## 2010-10-20 DIAGNOSIS — K5730 Diverticulosis of large intestine without perforation or abscess without bleeding: Secondary | ICD-10-CM | POA: Insufficient documentation

## 2010-10-20 DIAGNOSIS — Q619 Cystic kidney disease, unspecified: Secondary | ICD-10-CM | POA: Insufficient documentation

## 2010-10-20 DIAGNOSIS — Z8509 Personal history of malignant neoplasm of other digestive organs: Secondary | ICD-10-CM

## 2010-10-20 DIAGNOSIS — C3490 Malignant neoplasm of unspecified part of unspecified bronchus or lung: Secondary | ICD-10-CM | POA: Insufficient documentation

## 2010-10-20 DIAGNOSIS — I2510 Atherosclerotic heart disease of native coronary artery without angina pectoris: Secondary | ICD-10-CM | POA: Insufficient documentation

## 2010-10-20 DIAGNOSIS — C259 Malignant neoplasm of pancreas, unspecified: Secondary | ICD-10-CM | POA: Insufficient documentation

## 2010-10-20 MED ORDER — IOHEXOL 300 MG/ML  SOLN
100.0000 mL | Freq: Once | INTRAMUSCULAR | Status: AC | PRN
  Administered 2010-10-20: 100 mL via INTRAVENOUS

## 2010-10-25 ENCOUNTER — Encounter (HOSPITAL_BASED_OUTPATIENT_CLINIC_OR_DEPARTMENT_OTHER): Payer: Medicare Other | Admitting: Hematology & Oncology

## 2010-10-25 ENCOUNTER — Other Ambulatory Visit: Payer: Self-pay | Admitting: Hematology & Oncology

## 2010-10-25 DIAGNOSIS — C259 Malignant neoplasm of pancreas, unspecified: Secondary | ICD-10-CM

## 2010-10-25 DIAGNOSIS — D509 Iron deficiency anemia, unspecified: Secondary | ICD-10-CM

## 2010-10-25 DIAGNOSIS — C3490 Malignant neoplasm of unspecified part of unspecified bronchus or lung: Secondary | ICD-10-CM

## 2010-10-25 DIAGNOSIS — Z8509 Personal history of malignant neoplasm of other digestive organs: Secondary | ICD-10-CM

## 2010-10-25 DIAGNOSIS — Z85118 Personal history of other malignant neoplasm of bronchus and lung: Secondary | ICD-10-CM

## 2011-01-24 ENCOUNTER — Other Ambulatory Visit: Payer: Self-pay | Admitting: Hematology & Oncology

## 2011-01-24 ENCOUNTER — Other Ambulatory Visit (HOSPITAL_BASED_OUTPATIENT_CLINIC_OR_DEPARTMENT_OTHER): Payer: Medicare Other | Admitting: Lab

## 2011-01-24 DIAGNOSIS — C3430 Malignant neoplasm of lower lobe, unspecified bronchus or lung: Secondary | ICD-10-CM

## 2011-01-24 DIAGNOSIS — D509 Iron deficiency anemia, unspecified: Secondary | ICD-10-CM

## 2011-01-24 DIAGNOSIS — D649 Anemia, unspecified: Secondary | ICD-10-CM

## 2011-01-24 DIAGNOSIS — Z8509 Personal history of malignant neoplasm of other digestive organs: Secondary | ICD-10-CM

## 2011-01-24 LAB — CBC & DIFF AND RETIC
Basophils Absolute: 0.1 10*3/uL (ref 0.0–0.1)
HCT: 42.4 % (ref 38.4–49.9)
HGB: 14 g/dL (ref 13.0–17.1)
Immature Retic Fract: 7.6 % (ref 3.00–10.60)
LYMPH%: 20.9 % (ref 14.0–49.0)
MCHC: 33 g/dL (ref 32.0–36.0)
MONO#: 0.7 10*3/uL (ref 0.1–0.9)
NEUT%: 58 % (ref 39.0–75.0)
Platelets: 131 10*3/uL — ABNORMAL LOW (ref 140–400)
WBC: 6.6 10*3/uL (ref 4.0–10.3)
lymph#: 1.4 10*3/uL (ref 0.9–3.3)

## 2011-01-24 LAB — COMPREHENSIVE METABOLIC PANEL
ALT: 25 U/L (ref 0–53)
AST: 22 U/L (ref 0–37)
Albumin: 4.2 g/dL (ref 3.5–5.2)
CO2: 32 mEq/L (ref 19–32)
Calcium: 9.6 mg/dL (ref 8.4–10.5)
Chloride: 101 mEq/L (ref 96–112)
Potassium: 4.1 mEq/L (ref 3.5–5.3)
Sodium: 140 mEq/L (ref 135–145)
Total Protein: 7.1 g/dL (ref 6.0–8.3)

## 2011-01-24 LAB — FERRITIN
Ferritin: 179 ng/mL (ref 22–322)
Ferritin: 179 ng/mL (ref 22–322)

## 2011-01-24 LAB — IRON AND TIBC: TIBC: 283 ug/dL (ref 215–435)

## 2011-04-11 ENCOUNTER — Other Ambulatory Visit: Payer: Medicare Other | Admitting: Lab

## 2011-04-12 ENCOUNTER — Other Ambulatory Visit (HOSPITAL_BASED_OUTPATIENT_CLINIC_OR_DEPARTMENT_OTHER): Payer: Medicare Other | Admitting: Lab

## 2011-04-12 DIAGNOSIS — D649 Anemia, unspecified: Secondary | ICD-10-CM

## 2011-04-12 LAB — CBC & DIFF AND RETIC
Basophils Absolute: 0 10*3/uL (ref 0.0–0.1)
Eosinophils Absolute: 0.5 10*3/uL (ref 0.0–0.5)
HCT: 43.1 % (ref 38.4–49.9)
HGB: 14.3 g/dL (ref 13.0–17.1)
Immature Retic Fract: 5.3 % (ref 3.00–10.60)
MCH: 31.1 pg (ref 27.2–33.4)
MCV: 93.7 fL (ref 79.3–98.0)
MONO%: 10.4 % (ref 0.0–14.0)
NEUT#: 4.5 10*3/uL (ref 1.5–6.5)
NEUT%: 60.7 % (ref 39.0–75.0)
Platelets: 132 10*3/uL — ABNORMAL LOW (ref 140–400)
RDW: 12.9 % (ref 11.0–14.6)
Retic Ct Abs: 52.9 10*3/uL (ref 34.80–93.90)

## 2011-04-12 LAB — IRON AND TIBC
%SAT: 24 % (ref 20–55)
TIBC: 288 ug/dL (ref 215–435)

## 2011-04-12 LAB — COMPREHENSIVE METABOLIC PANEL
ALT: 28 U/L (ref 0–53)
AST: 23 U/L (ref 0–37)
Chloride: 100 mEq/L (ref 96–112)
Creatinine, Ser: 1.06 mg/dL (ref 0.50–1.35)
Sodium: 140 mEq/L (ref 135–145)
Total Bilirubin: 0.5 mg/dL (ref 0.3–1.2)
Total Protein: 6.6 g/dL (ref 6.0–8.3)

## 2011-04-12 LAB — TESTOSTERONE: Testosterone: 201.59 ng/dL — ABNORMAL LOW (ref 250–890)

## 2011-04-12 LAB — FERRITIN: Ferritin: 170 ng/mL (ref 22–322)

## 2011-04-13 ENCOUNTER — Encounter (HOSPITAL_COMMUNITY): Payer: Self-pay

## 2011-04-13 ENCOUNTER — Ambulatory Visit (HOSPITAL_COMMUNITY)
Admission: RE | Admit: 2011-04-13 | Discharge: 2011-04-13 | Disposition: A | Discharge status: Home / Self Care | Payer: Medicare Other | Source: Ambulatory Visit | Attending: Hematology & Oncology | Admitting: Hematology & Oncology

## 2011-04-13 DIAGNOSIS — C3490 Malignant neoplasm of unspecified part of unspecified bronchus or lung: Secondary | ICD-10-CM | POA: Insufficient documentation

## 2011-04-13 DIAGNOSIS — Z923 Personal history of irradiation: Secondary | ICD-10-CM | POA: Insufficient documentation

## 2011-04-13 DIAGNOSIS — C259 Malignant neoplasm of pancreas, unspecified: Secondary | ICD-10-CM | POA: Insufficient documentation

## 2011-04-13 DIAGNOSIS — I700 Atherosclerosis of aorta: Secondary | ICD-10-CM | POA: Insufficient documentation

## 2011-04-13 DIAGNOSIS — M899 Disorder of bone, unspecified: Secondary | ICD-10-CM | POA: Insufficient documentation

## 2011-04-13 DIAGNOSIS — Z9221 Personal history of antineoplastic chemotherapy: Secondary | ICD-10-CM | POA: Insufficient documentation

## 2011-04-13 DIAGNOSIS — Z9089 Acquired absence of other organs: Secondary | ICD-10-CM | POA: Insufficient documentation

## 2011-04-13 DIAGNOSIS — N281 Cyst of kidney, acquired: Secondary | ICD-10-CM | POA: Insufficient documentation

## 2011-04-13 MED ORDER — IOHEXOL 300 MG/ML  SOLN
100.0000 mL | Freq: Once | INTRAMUSCULAR | Status: AC | PRN
  Administered 2011-04-13: 100 mL via INTRAVENOUS

## 2011-04-20 ENCOUNTER — Ambulatory Visit (HOSPITAL_BASED_OUTPATIENT_CLINIC_OR_DEPARTMENT_OTHER): Payer: Medicare Other | Admitting: Hematology & Oncology

## 2011-04-20 VITALS — BP 156/77 | HR 53 | Temp 96.9°F | Wt 160.0 lb

## 2011-04-20 DIAGNOSIS — C3430 Malignant neoplasm of lower lobe, unspecified bronchus or lung: Secondary | ICD-10-CM

## 2011-04-20 DIAGNOSIS — C259 Malignant neoplasm of pancreas, unspecified: Secondary | ICD-10-CM

## 2011-04-20 DIAGNOSIS — E119 Type 2 diabetes mellitus without complications: Secondary | ICD-10-CM

## 2011-04-20 DIAGNOSIS — D509 Iron deficiency anemia, unspecified: Secondary | ICD-10-CM

## 2011-04-20 DIAGNOSIS — Z8509 Personal history of malignant neoplasm of other digestive organs: Secondary | ICD-10-CM

## 2011-04-20 NOTE — Progress Notes (Signed)
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 #2 iron deficiency anemia-recurrent #3 history of stage III adenocarcinoma of of the ampulla of Vater.  Current therapy: IV iron is indicated.  Interim history: [name] is doing well. He did have a followup CT scan done a week or so ago. The CT scan did not show any evidence of a recurrence of his lung cancer. His history of pancreatic cancer I think it is not a factor anymore.  He has gotten IV iron in the past. This is helped him quite a bit.  He does have multiple medical problems. His biggest one from my point of view his diabetes. There is no bleeding. He is off Coumadin. He does take aspirin.  He is still working. He doesn't travel.  On his physical exam, this is a well-developed well-nourished white gentleman in no obvious distress. Vital signs show temperature 96.9. Pulse 53. Blood pressure 136/77. Weight is 161 pounds.  Head and neck exam shows blocker or oral lesions. There are no palpable cervical or supraclavicular lymph nodes. There is no scleral icterus. Thyroid is not palpable. Lungs are clear bilaterally. Cardiac exam regular rate and rhythm with occasional extra beat there are no murmurs rubs or bruits. Abdominal exam shows laparotomy wound is well healed. There is no fluid wave. There is no palpable pedal spinal megaly exam shows left thoracoscopy scar that is well-healed. No tenderness is noted over the spine ribs or hips. Extremities shows no clubbing cyanosis or edema. Skin exam no rashes ecchymoses or petechia.  Labs: White count 7.3 hemoglobin 14.3 hematocrit 43.1 blood count 132. His ferritin is 170. Iron saturation is 24%. Testosterone was 201.  Impression: [name] is a 78 year old woman with a remote history of pancreatic cancer. He is out from therapy for this and by 9 years. He then presented with a second primary, that in in the left lung. He underwent resection in Jan 2010.  We will follow  him up in 6 months. We can get a chest x-ray on him now. I don't think we need to do scans.  I don't feel we need to do any lab work in between visits Alesi begins to complain of fatigue which point his iron may need to be checked again.

## 2011-10-31 ENCOUNTER — Other Ambulatory Visit (HOSPITAL_BASED_OUTPATIENT_CLINIC_OR_DEPARTMENT_OTHER): Payer: Medicare Other

## 2011-10-31 ENCOUNTER — Ambulatory Visit (HOSPITAL_BASED_OUTPATIENT_CLINIC_OR_DEPARTMENT_OTHER): Payer: Medicare Other | Admitting: Lab

## 2011-10-31 ENCOUNTER — Ambulatory Visit (HOSPITAL_BASED_OUTPATIENT_CLINIC_OR_DEPARTMENT_OTHER)
Admission: RE | Admit: 2011-10-31 | Discharge: 2011-10-31 | Disposition: A | Discharge status: Home / Self Care | Payer: Medicare Other | Source: Ambulatory Visit | Attending: Hematology & Oncology | Admitting: Hematology & Oncology

## 2011-10-31 ENCOUNTER — Ambulatory Visit (HOSPITAL_BASED_OUTPATIENT_CLINIC_OR_DEPARTMENT_OTHER): Payer: Medicare Other | Admitting: Hematology & Oncology

## 2011-10-31 DIAGNOSIS — C3430 Malignant neoplasm of lower lobe, unspecified bronchus or lung: Secondary | ICD-10-CM | POA: Insufficient documentation

## 2011-10-31 DIAGNOSIS — K838 Other specified diseases of biliary tract: Secondary | ICD-10-CM

## 2011-10-31 DIAGNOSIS — Z8509 Personal history of malignant neoplasm of other digestive organs: Secondary | ICD-10-CM

## 2011-10-31 DIAGNOSIS — C3490 Malignant neoplasm of unspecified part of unspecified bronchus or lung: Secondary | ICD-10-CM

## 2011-10-31 DIAGNOSIS — C259 Malignant neoplasm of pancreas, unspecified: Secondary | ICD-10-CM

## 2011-10-31 DIAGNOSIS — D509 Iron deficiency anemia, unspecified: Secondary | ICD-10-CM

## 2011-10-31 LAB — CBC WITH DIFFERENTIAL (CANCER CENTER ONLY)
BASO#: 0 10*3/uL (ref 0.0–0.2)
Eosinophils Absolute: 0.5 10*3/uL (ref 0.0–0.5)
HGB: 13.2 g/dL (ref 13.0–17.1)
LYMPH#: 1.3 10*3/uL (ref 0.9–3.3)
MONO#: 0.6 10*3/uL (ref 0.1–0.9)
NEUT#: 3.2 10*3/uL (ref 1.5–6.5)
RBC: 4.27 10*6/uL (ref 4.20–5.70)

## 2011-10-31 LAB — COMPREHENSIVE METABOLIC PANEL
Albumin: 3.9 g/dL (ref 3.5–5.2)
Alkaline Phosphatase: 56 U/L (ref 39–117)
CO2: 30 mEq/L (ref 19–32)
Chloride: 100 mEq/L (ref 96–112)
Glucose, Bld: 265 mg/dL — ABNORMAL HIGH (ref 70–99)
Potassium: 3.7 mEq/L (ref 3.5–5.3)
Sodium: 139 mEq/L (ref 135–145)
Total Protein: 6.4 g/dL (ref 6.0–8.3)

## 2011-10-31 LAB — IRON AND TIBC
Iron: 64 ug/dL (ref 42–165)
UIBC: 200 ug/dL (ref 125–400)

## 2011-10-31 NOTE — Progress Notes (Signed)
CC:   Danise Edge, M.D. Salvatore Decent [name] Fetch, M.D. Thora Lance, M.D.  DIAGNOSES: 1. Stage IA adenocarcinoma of left lung, resected. 2. 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 3. Iron-deficiency anemia.  CURRENT THERAPY:  IV iron as indicated.  INTERIM HISTORY:  [name] comes in for his followup.  We saw him 6 months ago.  Since then, he has had no problems himself.  His wife, unfortunately, has had issues with her bipolar disorder.  She had to be hospitalized.  She was in the hospital for about 4 weeks.  She is doing better now.  He has had no problems with bleeding.  He has had no abdominal pain. There has been no change in bowel or bladder habits.  His last iron studies done back in March showed ferritin of 170. Saturation was 24%.  He did have a chest x-ray done today.  The chest x-ray showed no evidence of recurrent lung cancer.  When we last saw him, his testosterone was only 200.  He does not want to take any supplemental testosterone.  PHYSICAL EXAMINATION:  General:  This is a well-developed, well- nourished white gentleman in no obvious distress.  Vital Signs:  Show a temperature of 98.1, pulse 56, respiratory rate 20, blood pressure 129/76, weight is 158.  Head and Neck Exam:  Shows a normocephalic, atraumatic skull.  There are no ocular or oral lesions.  There are no palpable cervical or supraclavicular lymph nodes.  Lungs:  Clear bilaterally.  Cardiac Exam:  Regular rate and rhythm with normal S1 and S2.  There are no murmurs, rubs, or bruits.  Abdominal Exam:  Soft with good bowel sounds.  There is no palpable abdominal mass.  There is no palpable hepatosplenomegaly.  He has a well-healed laparotomy scar. Back Exam:  Shows no tenderness over the spine, ribs, or hips.  He has a well-healed thoracoscopy scar on the right lateral chest wall.  He has no tenderness over the spine, ribs,  or hips.  Extremities:  Show no clubbing, cyanosis, or edema.  He has good range motion of his joints.  LABORATORY STUDIES:  Show a white count of 5.6, hemoglobin 13.2, hematocrit 39.9, and platelet count 114.  MCV is 93.  IMPRESSION:  [name] is a 78 year old gentleman with a past history of stage III adenocarcinoma of the ampulla of Vater.  This was probably 10 years ago.  I am not worried about this at all.  He is cured of this.  He then had a stage I lung primary.  This was resected back in January 2010.  We still need to follow him along with a chest x-ray every 6 months.  We will go ahead and plan to see him back in 6 months.  I really do not think we need any blood work on him in between visits.  It would not surprise me if his hemoglobin did drop a little bit.  I think some of this is because of low testosterone.  Again, he does not want to take any supplemental testosterone.    ______________________________ Josph Macho, M.D. PRE/MEDQ  D:  10/31/2011  T:  10/31/2011  Job:  [id number]

## 2011-10-31 NOTE — Progress Notes (Signed)
dcit

## 2011-10-31 NOTE — Patient Instructions (Signed)
NO smoking.   Call if any problems

## 2011-11-02 ENCOUNTER — Other Ambulatory Visit: Payer: Self-pay | Admitting: *Deleted

## 2011-11-02 ENCOUNTER — Telehealth: Payer: Self-pay | Admitting: *Deleted

## 2011-11-02 DIAGNOSIS — D509 Iron deficiency anemia, unspecified: Secondary | ICD-10-CM

## 2011-11-02 NOTE — Telephone Encounter (Signed)
Message copied by Anselm Jungling on Wed Nov 02, 2011  1:01 PM ------      Message from: Arlan Organ R      Created: Mon Oct 31, 2011  3:14 PM       Call - iron is dropping!!  Need 1 dose of Feraheme at 1020mg  to keep iron up for next 6 months.  His testosterone is also lower.  Please set up for me!!  Thanks!!! Cindee Lame

## 2011-11-02 NOTE — Telephone Encounter (Signed)
Called patient to let him know that his iron is dropping.  Needs 1 dose of Feraheme 1020 mg IV in next few weeks.  Patient will call us to schedule this.  He wants to compare these labs with the labs done at his doctors office.

## 2012-04-20 ENCOUNTER — Telehealth: Payer: Self-pay | Admitting: Hematology & Oncology

## 2012-04-20 NOTE — Telephone Encounter (Signed)
Pt aware of 3-25 appointment and to get chest x-ray before coming up here

## 2012-04-30 ENCOUNTER — Ambulatory Visit: Payer: Medicare Other | Admitting: Hematology & Oncology

## 2012-04-30 ENCOUNTER — Other Ambulatory Visit: Payer: Medicare Other | Admitting: Lab

## 2012-05-01 ENCOUNTER — Ambulatory Visit (HOSPITAL_BASED_OUTPATIENT_CLINIC_OR_DEPARTMENT_OTHER): Payer: Medicare Other | Admitting: Medical

## 2012-05-01 ENCOUNTER — Other Ambulatory Visit (HOSPITAL_BASED_OUTPATIENT_CLINIC_OR_DEPARTMENT_OTHER): Payer: Medicare Other | Admitting: Lab

## 2012-05-01 ENCOUNTER — Ambulatory Visit (HOSPITAL_BASED_OUTPATIENT_CLINIC_OR_DEPARTMENT_OTHER)
Admission: RE | Admit: 2012-05-01 | Discharge: 2012-05-01 | Disposition: A | Discharge status: Home / Self Care | Payer: Medicare Other | Source: Ambulatory Visit | Attending: Hematology & Oncology | Admitting: Hematology & Oncology

## 2012-05-01 VITALS — BP 159/86 | HR 73 | Temp 97.7°F | Resp 18 | Ht 68.0 in | Wt 160.0 lb

## 2012-05-01 DIAGNOSIS — Z85118 Personal history of other malignant neoplasm of bronchus and lung: Secondary | ICD-10-CM

## 2012-05-01 DIAGNOSIS — C3490 Malignant neoplasm of unspecified part of unspecified bronchus or lung: Secondary | ICD-10-CM | POA: Insufficient documentation

## 2012-05-01 DIAGNOSIS — D649 Anemia, unspecified: Secondary | ICD-10-CM | POA: Insufficient documentation

## 2012-05-01 DIAGNOSIS — D509 Iron deficiency anemia, unspecified: Secondary | ICD-10-CM

## 2012-05-01 DIAGNOSIS — K838 Other specified diseases of biliary tract: Secondary | ICD-10-CM

## 2012-05-01 DIAGNOSIS — Z8589 Personal history of malignant neoplasm of other organs and systems: Secondary | ICD-10-CM

## 2012-05-01 LAB — CBC WITH DIFFERENTIAL (CANCER CENTER ONLY)
EOS%: 7.5 % — ABNORMAL HIGH (ref 0.0–7.0)
Eosinophils Absolute: 0.4 10*3/uL (ref 0.0–0.5)
LYMPH%: 17.5 % (ref 14.0–48.0)
MCH: 30.4 pg (ref 28.0–33.4)
MCHC: 32.8 g/dL (ref 32.0–35.9)
MCV: 93 fL (ref 82–98)
MONO%: 9.1 % (ref 0.0–13.0)
Platelets: 118 10*3/uL — ABNORMAL LOW (ref 145–400)
RBC: 4.44 10*6/uL (ref 4.20–5.70)

## 2012-05-01 LAB — COMPREHENSIVE METABOLIC PANEL
AST: 18 U/L (ref 0–37)
Alkaline Phosphatase: 58 U/L (ref 39–117)
BUN: 17 mg/dL (ref 6–23)
Glucose, Bld: 229 mg/dL — ABNORMAL HIGH (ref 70–99)
Sodium: 140 mEq/L (ref 135–145)
Total Bilirubin: 0.5 mg/dL (ref 0.3–1.2)
Total Protein: 6.5 g/dL (ref 6.0–8.3)

## 2012-05-01 LAB — IRON AND TIBC
TIBC: 301 ug/dL (ref 215–435)
UIBC: 229 ug/dL (ref 125–400)

## 2012-05-01 LAB — TESTOSTERONE: Testosterone: 246 ng/dL — ABNORMAL LOW (ref 300–890)

## 2012-05-01 LAB — FERRITIN: Ferritin: 33 ng/mL (ref 22–322)

## 2012-05-01 LAB — CHCC SATELLITE - SMEAR

## 2012-05-01 NOTE — Progress Notes (Signed)
DIAGNOSES: 1. Stage IA adenocarcinoma of left lung, resected. 2. 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 3. Iron-deficiency anemia. 4. Hypo-testoseronemia  CURRENT THERAPY:  IV iron as indicated.  INTERIM HISTORY:  [name] presents today for an office followup visit.  Overall, he, reports, that he's doing quite well.  His chest x-ray, that he just had this morning revealed no acute cardiopulmonary abnormalities seen.  He will continue to receive chest x-rays every 6 months for his history of stage IA adenocarcinoma of the left lung.  Again, this was resected.  He does have low testosterone levels.  His last testosterone level was 192.  He does not want to take any supplemental testosterone.  His last iron panel revealed a ferritin of 73, an iron of 64, with 24% saturation. He's not reporting any new problems or complications of note, he did have to have a Whipple done for his stage III adenocarcinoma of the ampulla of Vater.  He, reports, since that surgery.  He has had a difficult time gaining weight.  He, otherwise, reports, that he has an excellent appetite.  He denies any nausea, vomiting, diarrhea, constipation, chest pain, shortness of breath, cough, fevers, chills, or night sweats.  He denies any obvious, or abnormal bleeding.  He denies any abdominal pain.  He denies any headaches visual changes, or rashes.  He denies any obvious, or abnormal bleeding.  Review of Systems: Constitutional:Negative for malaise/fatigue, fever, chills, weight loss, diaphoresis, activity change, appetite change, and unexpected weight change.  HEENT: Negative for double vision, blurred vision, visual loss, ear pain, tinnitus, congestion, rhinorrhea, epistaxis sore throat or sinus disease, oral pain/lesion, tongue soreness Respiratory: Negative for cough, chest tightness, shortness of breath, wheezing and stridor.  Cardiovascular: Negative for  chest pain, palpitations, leg swelling, orthopnea, PND, DOE or claudication Gastrointestinal: Negative for nausea, vomiting, abdominal pain, diarrhea, constipation, blood in stool, melena, hematochezia, abdominal distention, anal bleeding, rectal pain, anorexia and hematemesis.  Genitourinary: Negative for dysuria, frequency, hematuria,  Musculoskeletal: Negative for myalgias, back pain, joint swelling, arthralgias and gait problem.  Skin: Negative for rash, color change, pallor and wound.  Neurological:. Negative for dizziness/light-headedness, tremors, seizures, syncope, facial asymmetry, speech difficulty, weakness, numbness, headaches and paresthesias.  Hematological: Negative for adenopathy. Does not bruise/bleed easily.  Psychiatric/Behavioral:  Negative for depression, no loss of interest in normal activity or change in sleep pattern.   Physical Exam: This is a pleasant, well-developed, well-nourished, 79 year old, white gentleman, in no obvious distress Vitals: Temperature 97.7 degrees, pulse 73, respirations 16, blood pressure 159/86, weight 160 pounds HEENT reveals a normocephalic, atraumatic skull, no scleral icterus, no oral lesions  Neck is supple without any cervical or supraclavicular adenopathy.  Lungs are clear to auscultation bilaterally. There are no wheezes, rales or rhonci Cardiac is regular rate and rhythm with a normal S1 and S2. There are no murmurs, rubs, or bruits.  Abdomen is soft with good bowel sounds, there is no palpable mass. There is no palpable hepatosplenomegaly. There is no palpable fluid wave.  Musculoskeletal no tenderness of the spine, ribs, or hips.  Extremities there are no clubbing, cyanosis, or edema.  Skin no petechia, purpura or ecchymosis Neurologic is nonfocal.  Laboratory Data: White count 5.4, hemoglobin 13.5, hematocrit 41.1, platelets 118,000  Radiology: CXR 10/31/2011 Impression: No acute cardiopulmonary abnormality seen.  Stable nodular  density seen in the right lower lobe.  Compared to prior exam: Followup radiographs in 6 months.  Recommended.  Current Outpatient Prescriptions on File Prior to Visit  Medication Sig Dispense Refill  . atorvastatin (LIPITOR) 10 MG tablet 10 mg daily. Pt states he takes 3 or 4 times a week.      . digoxin (LANOXIN) 0.125 MG tablet Take 125 mcg by mouth daily.      Marland Kitchen diltiazem (CARDIZEM CD) 180 MG 24 hr capsule 180 mg daily.       Marland Kitchen econazole nitrate 1 % cream every morning.       Marland Kitchen glimepiride (AMARYL) 2 MG tablet Take 2 mg by mouth daily before breakfast.      . hydrochlorothiazide (MICROZIDE) 12.5 MG capsule Take 12.5 mg by mouth daily.      Demetra Shiner Device MISC every morning.       Marland Kitchen levothyroxine (SYNTHROID, LEVOTHROID) 112 MCG tablet Take 112 mcg by mouth daily.      . metFORMIN (GLUCOPHAGE) 500 MG tablet Take 500 mg by mouth daily with breakfast.       . warfarin (COUMADIN) 5 MG tablet Take 5 mg by mouth daily.       No current facility-administered medications on file prior to visit.   Assessment/Plan: This is a pleasant, 79 year old, gentleman, with the following issues:  #1.  Past history of stage III adenocarcinoma of the ampulla of Vater.  This was about 10 years ago.  There is no current evidence of recurrence.   #2.  History of stage I lung Primary.  This was resected back in January, 2010.  He still continues to receive a chest x-ray every 6 months.  #3.  Hypo-testosteronemia.  He refuses supplemental testosterone.  #4.  Iron deficiency anemia.  We'll follow him along with an iron panel.  #5.  Followup.  We will follow back up with him in 6 months, but before then should there be questions or concerns.

## 2012-05-03 ENCOUNTER — Telehealth: Payer: Self-pay | Admitting: *Deleted

## 2012-05-03 NOTE — Telephone Encounter (Signed)
Called patient to let him know that his CXR was normal per dr. Myna Hidalgo and his iron and testosterone levels were normal per dr. Myna Hidalgo

## 2012-05-03 NOTE — Telephone Encounter (Signed)
Message copied by Anselm Jungling on Thu May 03, 2012 11:33 AM ------      Message from: Arlan Organ R      Created: Wed May 02, 2012  8:09 PM       Call - iron is ok!!  Testosterone also ok!!  Cindee Lame ------

## 2012-05-26 ENCOUNTER — Encounter: Payer: Self-pay | Admitting: Pharmacist Clinician (PhC)/ Clinical Pharmacy Specialist

## 2012-05-26 DIAGNOSIS — Z7901 Long term (current) use of anticoagulants: Secondary | ICD-10-CM | POA: Insufficient documentation

## 2012-05-26 DIAGNOSIS — I4891 Unspecified atrial fibrillation: Secondary | ICD-10-CM | POA: Insufficient documentation

## 2012-06-28 ENCOUNTER — Ambulatory Visit (INDEPENDENT_AMBULATORY_CARE_PROVIDER_SITE_OTHER): Payer: Medicare Other | Admitting: Pharmacist Clinician (PhC)/ Clinical Pharmacy Specialist

## 2012-06-28 VITALS — BP 150/92 | HR 80

## 2012-06-28 DIAGNOSIS — Z7901 Long term (current) use of anticoagulants: Secondary | ICD-10-CM

## 2012-06-28 DIAGNOSIS — I4891 Unspecified atrial fibrillation: Secondary | ICD-10-CM

## 2012-08-22 ENCOUNTER — Ambulatory Visit (INDEPENDENT_AMBULATORY_CARE_PROVIDER_SITE_OTHER): Payer: Medicare Other | Admitting: Pharmacist Clinician (PhC)/ Clinical Pharmacy Specialist

## 2012-08-22 VITALS — BP 146/84 | HR 76

## 2012-08-22 DIAGNOSIS — I4891 Unspecified atrial fibrillation: Secondary | ICD-10-CM

## 2012-08-22 DIAGNOSIS — Z7901 Long term (current) use of anticoagulants: Secondary | ICD-10-CM

## 2012-08-22 LAB — POCT INR: INR: 2.2

## 2012-10-03 ENCOUNTER — Ambulatory Visit (INDEPENDENT_AMBULATORY_CARE_PROVIDER_SITE_OTHER): Payer: Medicare Other | Admitting: Pharmacist Clinician (PhC)/ Clinical Pharmacy Specialist

## 2012-10-03 VITALS — BP 152/90 | HR 76

## 2012-10-03 DIAGNOSIS — I4891 Unspecified atrial fibrillation: Secondary | ICD-10-CM

## 2012-10-03 DIAGNOSIS — Z7901 Long term (current) use of anticoagulants: Secondary | ICD-10-CM

## 2012-10-24 ENCOUNTER — Ambulatory Visit (INDEPENDENT_AMBULATORY_CARE_PROVIDER_SITE_OTHER): Payer: Medicare Other | Admitting: Pharmacist Clinician (PhC)/ Clinical Pharmacy Specialist

## 2012-10-24 VITALS — BP 138/80 | HR 68

## 2012-10-24 DIAGNOSIS — Z7901 Long term (current) use of anticoagulants: Secondary | ICD-10-CM

## 2012-10-24 DIAGNOSIS — I4891 Unspecified atrial fibrillation: Secondary | ICD-10-CM

## 2012-11-02 ENCOUNTER — Ambulatory Visit (HOSPITAL_BASED_OUTPATIENT_CLINIC_OR_DEPARTMENT_OTHER): Payer: Medicare Other | Admitting: Hematology & Oncology

## 2012-11-02 ENCOUNTER — Ambulatory Visit (HOSPITAL_BASED_OUTPATIENT_CLINIC_OR_DEPARTMENT_OTHER)
Admission: RE | Admit: 2012-11-02 | Discharge: 2012-11-02 | Disposition: A | Discharge status: Home / Self Care | Payer: Medicare Other | Source: Ambulatory Visit | Attending: Hematology & Oncology | Admitting: Hematology & Oncology

## 2012-11-02 ENCOUNTER — Other Ambulatory Visit (HOSPITAL_BASED_OUTPATIENT_CLINIC_OR_DEPARTMENT_OTHER): Payer: Medicare Other | Admitting: Lab

## 2012-11-02 ENCOUNTER — Ambulatory Visit (HOSPITAL_BASED_OUTPATIENT_CLINIC_OR_DEPARTMENT_OTHER)
Admission: RE | Admit: 2012-11-02 | Discharge: 2012-11-02 | Disposition: A | Discharge status: Home / Self Care | Payer: Medicare Other | Source: Ambulatory Visit | Attending: Medical | Admitting: Medical

## 2012-11-02 VITALS — BP 146/77 | HR 71 | Temp 97.7°F | Resp 18 | Ht 68.0 in | Wt 157.0 lb

## 2012-11-02 DIAGNOSIS — E291 Testicular hypofunction: Secondary | ICD-10-CM | POA: Insufficient documentation

## 2012-11-02 DIAGNOSIS — R911 Solitary pulmonary nodule: Secondary | ICD-10-CM

## 2012-11-02 DIAGNOSIS — Z85118 Personal history of other malignant neoplasm of bronchus and lung: Secondary | ICD-10-CM

## 2012-11-02 DIAGNOSIS — D509 Iron deficiency anemia, unspecified: Secondary | ICD-10-CM

## 2012-11-02 DIAGNOSIS — C241 Malignant neoplasm of ampulla of Vater: Secondary | ICD-10-CM | POA: Insufficient documentation

## 2012-11-02 DIAGNOSIS — C3430 Malignant neoplasm of lower lobe, unspecified bronchus or lung: Secondary | ICD-10-CM

## 2012-11-02 DIAGNOSIS — M899 Disorder of bone, unspecified: Secondary | ICD-10-CM | POA: Insufficient documentation

## 2012-11-02 DIAGNOSIS — E349 Endocrine disorder, unspecified: Secondary | ICD-10-CM

## 2012-11-02 DIAGNOSIS — J438 Other emphysema: Secondary | ICD-10-CM | POA: Insufficient documentation

## 2012-11-02 DIAGNOSIS — R9389 Abnormal findings on diagnostic imaging of other specified body structures: Secondary | ICD-10-CM | POA: Insufficient documentation

## 2012-11-02 DIAGNOSIS — I2510 Atherosclerotic heart disease of native coronary artery without angina pectoris: Secondary | ICD-10-CM | POA: Insufficient documentation

## 2012-11-02 LAB — IRON AND TIBC CHCC
%SAT: 19 % — ABNORMAL LOW (ref 20–55)
TIBC: 279 ug/dL (ref 202–409)
UIBC: 225 ug/dL (ref 117–376)

## 2012-11-02 LAB — CBC WITH DIFFERENTIAL (CANCER CENTER ONLY)
BASO%: 0.6 % (ref 0.0–2.0)
LYMPH%: 14.2 % (ref 14.0–48.0)
MCH: 30.6 pg (ref 28.0–33.4)
MCV: 95 fL (ref 82–98)
MONO%: 13.6 % — ABNORMAL HIGH (ref 0.0–13.0)
Platelets: 101 10*3/uL — ABNORMAL LOW (ref 145–400)
RDW: 13.6 % (ref 11.1–15.7)
WBC: 6.7 10*3/uL (ref 4.0–10.0)

## 2012-11-02 LAB — COMPREHENSIVE METABOLIC PANEL
ALT: 17 U/L (ref 0–53)
AST: 17 U/L (ref 0–37)
Creatinine, Ser: 0.99 mg/dL (ref 0.50–1.35)
Total Bilirubin: 0.7 mg/dL (ref 0.3–1.2)

## 2012-11-02 LAB — CHCC SATELLITE - SMEAR

## 2012-11-02 LAB — TESTOSTERONE: Testosterone: 159 ng/dL — ABNORMAL LOW (ref 300–890)

## 2012-11-02 NOTE — Progress Notes (Signed)
This office note has been dictated.

## 2012-11-06 ENCOUNTER — Telehealth: Payer: Self-pay | Admitting: Oncology

## 2012-11-06 NOTE — Progress Notes (Signed)
CC:   Thora Lance, M.D.  DIAGNOSES: 1. Stage IA adenocarcinoma of the left lung, resected. 2. 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 3. Iron-deficiency anemia, recurrent. 4. Hypotestosteronemia.  CURRENT THERAPY:  IV iron as indicated.  INTERIM HISTORY:  [name] comes in for followup.  We went ahead and did a chest x-ray on him today.  The radiologist said that he had a right lung nodule that "appears larger."  It now measures 11 mm.  Of course, a CT scan was recommended.  We will have to see about getting a CT scan.  [name] has lost a little weight.  He says his appetite is good.  I think some of the weight loss is because he is low on testosterone. Unfortunately, he has coronary artery disease.  I do not think we can give him testosterone replacement therapy.  Otherwise, he is doing fairly well.  He has had no cough.  He has had no abdominal pain.  He is working.  He has had no change in bowel or bladder habits.  He has had no headache.  When we last saw him in March, his ferritin was 33 with an iron saturation of 24%.  PHYSICAL EXAM:  General:  This is a fairly well-developed, well- nourished white gentleman in no obvious distress.  Vital Signs:  Show a temperature of 97.7, pulse 71, respiratory rate 18, blood pressure 146/77.  Weight is 156.  Head and Neck:  Show a normal, normocephalic, atraumatic skull.  There are no ocular or oral lesions.  There are no palpable cervical or supraclavicular lymph nodes.  Lungs:  Clear bilaterally.  Cardiac:  Regular rate and rhythm with a normal S1, S2. He does have an occasional extra beat.  He has a 1/6 systolic ejection murmur.  Abdomen:  Soft.  He has good bowel sounds.  There is a well- healed laparotomy scar.  There is no palpable abdominal mass.  There is no palpable hepatosplenomegaly.  Back:  Shows the laparoscopy scar from his VATS procedure on the left side.  This is well healed.   No tenderness is noted over the spine, ribs, or hips.  Extremities:  Show no clubbing, cyanosis, or edema.  Neurologic:  Shows no focal neurological deficits.  LABORATORY STUDIES:  White cell count is 6.7, hemoglobin 13.3, hematocrit 41, platelet count 101,000.  IMPRESSION:  [name] is a 79 year old gentleman.  He has had history of 2 malignancies.  His 1st malignancy was probably about 11 years ago.  He is cured of this.  He then presented with a stage I lung cancer.  This was back in January 2010.  This was resected.  Again, I would have a hard time believing that he now has another issue. However, we are compelled to do a CT scan.  We will try to get this done today.  I want to probably repeat a CT scan on him in 3 months.  I just do not want to get overly aggressive with Mr. Rehfeldt.  His platelet count is down.  I looked at his blood smear.  I did not see anything that looked suspicious with respect to his thrombocytopenia. This certainly could be medication related.  He is on Coumadin, but I will see if that needs to make any changes with his Coumadin dose.  Again, we will plan to do a CT scan today.  I will get him back in 3 months.  We will try to  get his CT scan the same day that I see him.  I spent a good 40 minutes with him today.  Given that he has this issue with his right lung, this complicates his care.    ______________________________ Josph Macho, M.D. PRE/MEDQ  D:  11/02/2012  T:  11/06/2012  Job:  [id number]

## 2012-11-06 NOTE — Telephone Encounter (Addendum)
Message copied by Lacie Draft on Tue Nov 06, 2012  2:52 PM ------      Message from: Josph Macho      Created: Sun Nov 04, 2012  8:24 PM       Call - iron is ok.  Pete -----Left message on voicemail.

## 2012-11-21 ENCOUNTER — Ambulatory Visit: Payer: Medicare Other | Admitting: Pharmacist Clinician (PhC)/ Clinical Pharmacy Specialist

## 2012-11-22 ENCOUNTER — Ambulatory Visit (INDEPENDENT_AMBULATORY_CARE_PROVIDER_SITE_OTHER): Payer: Medicare Other | Admitting: Pharmacist Clinician (PhC)/ Clinical Pharmacy Specialist

## 2012-11-22 VITALS — BP 160/82 | HR 80

## 2012-11-22 DIAGNOSIS — I4891 Unspecified atrial fibrillation: Secondary | ICD-10-CM

## 2012-11-22 DIAGNOSIS — Z7901 Long term (current) use of anticoagulants: Secondary | ICD-10-CM

## 2012-12-13 ENCOUNTER — Ambulatory Visit (INDEPENDENT_AMBULATORY_CARE_PROVIDER_SITE_OTHER): Payer: Medicare Other | Admitting: Pharmacist Clinician (PhC)/ Clinical Pharmacy Specialist

## 2012-12-13 VITALS — BP 130/80 | HR 88

## 2012-12-13 DIAGNOSIS — Z7901 Long term (current) use of anticoagulants: Secondary | ICD-10-CM

## 2012-12-13 DIAGNOSIS — I4891 Unspecified atrial fibrillation: Secondary | ICD-10-CM

## 2012-12-13 LAB — POCT INR: INR: 2.7

## 2013-01-10 ENCOUNTER — Ambulatory Visit (INDEPENDENT_AMBULATORY_CARE_PROVIDER_SITE_OTHER): Payer: Medicare Other | Admitting: Pharmacist Clinician (PhC)/ Clinical Pharmacy Specialist

## 2013-01-10 VITALS — BP 140/82 | HR 72

## 2013-01-10 DIAGNOSIS — I4891 Unspecified atrial fibrillation: Secondary | ICD-10-CM

## 2013-01-10 DIAGNOSIS — Z7901 Long term (current) use of anticoagulants: Secondary | ICD-10-CM

## 2013-01-10 LAB — POCT INR: INR: 2.1

## 2013-01-14 NOTE — Progress Notes (Signed)
CC:   [name], M.D.  DIAGNOSES: 1. Stage IA adenocarcinoma of the left lung-resected. 2. 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 3. Iron-deficiency anemia-recurrent. 4. Hypotestosteronemia.  CURRENT THERAPY:  IV iron as indicated.  INTERIM HISTORY:  [name] comes in for followup.  We went ahead and did a chest x-ray on him today.  The radiologist said that he had a right lung nodule that "appears larger."  It now measures 11 mm.  Of course, a CT scan was recommended.  We will have to see about getting a CT scan.  [name] has lost a little weight.  He states his appetite is good. I think some of the weight loss is because he is low in testosterone. Unfortunately, he has coronary artery disease.  I do not think we can give him testosterone replacement therapy.  Otherwise, he is doing fairly well.  He has had no cough.  He has had no abdominal pain.  He is working.  He has had no change in bowel or bladder habits.  He has had no headache.  When we last saw him in March, his ferritin was 33 with an iron saturation of 24%.  PHYSICAL EXAM:  General:  This is a fairly well-developed, well- nourished, white gentleman in no obvious distress.  Vital signs: Temperature of 97.7, pulse 71, respiratory rate 18, blood pressure 146/77, and weight is 156.  Head and Neck:  Shows a normocephalic, atraumatic skull.  There are no ocular or oral lesions.  There are no palpable cervical or supraclavicular lymph nodes.  Lungs:  Clear bilaterally.  Cardiac:  Regular rate and rhythm with a normal S1, S2. He does have an occasional extra beat.  He has a 1/6 systolic ejection murmur.  Abdomen:  Soft.  He has good bowel sounds.  There is a well- healed laparotomy scar.  There is no palpable abdominal mass.  There is no palpable hepatosplenomegaly.  Back:  Shows the laparoscopy scar from his VATS procedure on the left side.  This is well-healed.  No tenderness is  noted over the spine, ribs, or hips.  Extremities:  Shows no clubbing, cyanosis or edema.  Neurological:  Shows no focal neurological deficits.  LABORATORY STUDIES:  White cell count is 6.7, hemoglobin 13.3, hematocrit 41, and platelet count 101,000.  IMPRESSION:  [name] is a 79 year old gentleman.  He has had a history of 2 malignancies.  His 1st malignancy was probably about 11 years ago.  He is cured of this.  He then was diagnosed with a stage I lung cancer.  This was back in January 2010.  This was resected.  Again, I would have a hard time believing that he now has another issue. However, we are compelled to do a CT scan.  We will try to get this done today.  I want to probably repeat a CT scan on him in 3 months.  I just do not want to get overly aggressive with [name].  His platelet count is down.  I looked at his blood smear.  I did not see anything that looked suspicious with respect to his thrombocytopenia. This certainly could be medication related.  He is on Coumadin, but I do not think they need to make any changes with his Coumadin dose.  Again, we will plan to do a CT scan today.  I will get him back in 3 months.  We will try to get his CT scan on  the same day that I see him.  I spent a good 40 minutes with him today.  Given that he has this issue with his right lung, this complicates his care.    ______________________________ [name], M.D. PRE/MEDQ  D:  11/02/2012  T:  01/14/2013  Job:  [id number]

## 2013-01-24 ENCOUNTER — Telehealth: Payer: Self-pay | Admitting: Hematology & Oncology

## 2013-01-24 NOTE — Telephone Encounter (Signed)
[facility]. Pt aware appointment moved to [facility], In 8 am. Left Delice Bison note for precert location change

## 2013-01-25 ENCOUNTER — Ambulatory Visit (HOSPITAL_COMMUNITY)
Admission: RE | Admit: 2013-01-25 | Discharge: 2013-01-25 | Disposition: A | Discharge status: Home / Self Care | Payer: Medicare Other | Source: Ambulatory Visit | Attending: Hematology & Oncology | Admitting: Hematology & Oncology

## 2013-01-25 ENCOUNTER — Other Ambulatory Visit (HOSPITAL_BASED_OUTPATIENT_CLINIC_OR_DEPARTMENT_OTHER): Payer: Medicare Other | Admitting: Lab

## 2013-01-25 ENCOUNTER — Ambulatory Visit (HOSPITAL_BASED_OUTPATIENT_CLINIC_OR_DEPARTMENT_OTHER): Payer: Medicare Other | Admitting: Hematology & Oncology

## 2013-01-25 ENCOUNTER — Ambulatory Visit (HOSPITAL_BASED_OUTPATIENT_CLINIC_OR_DEPARTMENT_OTHER): Payer: Medicare Other

## 2013-01-25 DIAGNOSIS — M899 Disorder of bone, unspecified: Secondary | ICD-10-CM | POA: Insufficient documentation

## 2013-01-25 DIAGNOSIS — Z85118 Personal history of other malignant neoplasm of bronchus and lung: Secondary | ICD-10-CM

## 2013-01-25 DIAGNOSIS — I2510 Atherosclerotic heart disease of native coronary artery without angina pectoris: Secondary | ICD-10-CM | POA: Insufficient documentation

## 2013-01-25 DIAGNOSIS — C241 Malignant neoplasm of ampulla of Vater: Secondary | ICD-10-CM

## 2013-01-25 DIAGNOSIS — Z8589 Personal history of malignant neoplasm of other organs and systems: Secondary | ICD-10-CM

## 2013-01-25 DIAGNOSIS — D508 Other iron deficiency anemias: Secondary | ICD-10-CM

## 2013-01-25 DIAGNOSIS — E349 Endocrine disorder, unspecified: Secondary | ICD-10-CM

## 2013-01-25 DIAGNOSIS — C3490 Malignant neoplasm of unspecified part of unspecified bronchus or lung: Secondary | ICD-10-CM | POA: Insufficient documentation

## 2013-01-25 DIAGNOSIS — D509 Iron deficiency anemia, unspecified: Secondary | ICD-10-CM

## 2013-01-25 DIAGNOSIS — R911 Solitary pulmonary nodule: Secondary | ICD-10-CM

## 2013-01-25 DIAGNOSIS — I700 Atherosclerosis of aorta: Secondary | ICD-10-CM | POA: Insufficient documentation

## 2013-01-25 LAB — CMP (CANCER CENTER ONLY)
Alkaline Phosphatase: 62 U/L (ref 26–84)
BUN, Bld: 17 mg/dL (ref 7–22)
CO2: 35 mEq/L — ABNORMAL HIGH (ref 18–33)
Creat: 1 mg/dl (ref 0.6–1.2)
Glucose, Bld: 164 mg/dL — ABNORMAL HIGH (ref 73–118)
Sodium: 143 mEq/L (ref 128–145)
Total Bilirubin: 0.9 mg/dl (ref 0.20–1.60)

## 2013-01-25 LAB — FERRITIN CHCC: Ferritin: 49 ng/ml (ref 22–316)

## 2013-01-25 LAB — CBC WITH DIFFERENTIAL (CANCER CENTER ONLY)
BASO%: 0.8 % (ref 0.0–2.0)
EOS%: 4.3 % (ref 0.0–7.0)
HCT: 39.2 % (ref 38.7–49.9)
HGB: 12.4 g/dL — ABNORMAL LOW (ref 13.0–17.1)
LYMPH#: 1.1 10*3/uL (ref 0.9–3.3)
MCHC: 31.6 g/dL — ABNORMAL LOW (ref 32.0–35.9)
MONO#: 0.8 10*3/uL (ref 0.1–0.9)
NEUT#: 4.3 10*3/uL (ref 1.5–6.5)
NEUT%: 66.1 % (ref 40.0–80.0)
Platelets: 122 10*3/uL — ABNORMAL LOW (ref 145–400)
RBC: 4.09 10*6/uL — ABNORMAL LOW (ref 4.20–5.70)
RDW: 13.7 % (ref 11.1–15.7)
WBC: 6.6 10*3/uL (ref 4.0–10.0)

## 2013-01-25 LAB — IRON AND TIBC CHCC
Iron: 83 ug/dL (ref 42–163)
TIBC: 333 ug/dL (ref 202–409)
UIBC: 249 ug/dL (ref 117–376)

## 2013-01-27 ENCOUNTER — Encounter: Payer: Self-pay | Admitting: Hematology & Oncology

## 2013-01-27 DIAGNOSIS — D500 Iron deficiency anemia secondary to blood loss (chronic): Secondary | ICD-10-CM | POA: Insufficient documentation

## 2013-01-27 DIAGNOSIS — C241 Malignant neoplasm of ampulla of Vater: Secondary | ICD-10-CM

## 2013-01-27 DIAGNOSIS — C3430 Malignant neoplasm of lower lobe, unspecified bronchus or lung: Secondary | ICD-10-CM | POA: Insufficient documentation

## 2013-01-27 DIAGNOSIS — D509 Iron deficiency anemia, unspecified: Secondary | ICD-10-CM

## 2013-01-27 HISTORY — DX: Iron deficiency anemia, unspecified: D50.9

## 2013-01-27 HISTORY — DX: Malignant neoplasm of lower lobe, unspecified bronchus or lung: C34.30

## 2013-01-27 HISTORY — DX: Malignant neoplasm of ampulla of Vater: C24.1

## 2013-01-27 NOTE — Progress Notes (Signed)
This office note has been dictated.

## 2013-01-28 ENCOUNTER — Telehealth: Payer: Self-pay | Admitting: Hematology & Oncology

## 2013-01-28 NOTE — Telephone Encounter (Signed)
Mailed 3-23 and 6-11 schedule, Wife is also aware

## 2013-01-28 NOTE — Progress Notes (Signed)
CC:   [name], M.D.  DIAGNOSES: 1. Stage Ia adenocarcinoma of the left lung-resected. 2. 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 3. Recurrent iron-deficiency anemia. 4. Hypotestosteronemia.  CURRENT THERAPY:  IV iron as indicated.  INTERIM HISTORY:  [name] comes in for his followup.  He admits that he does have intermittent atrial fibrillation.  He is on warfarin for this.  He does not notice any obvious bleeding issues.  He actually feels fairly well.  He has had no cough or shortness of breath.  He is diabetic.  He is trying to manage his blood sugars.  When we last saw him, his iron studies showed a ferritin of 85 with an iron saturation of 19%.  We did go ahead and do a CT scan on him today.  This was done without contrast.  There was no evidence of recurrent or metastatic disease with respect to his lung.  His upper abdomen looked fine.  He has had no abdominal pain.  He has had no sweats.  He has had no leg swelling.  There has been no rashes.  He is still working part time.  PHYSICAL EXAMINATION:  General:  This is a fairly well-developed, well- nourished white gentleman in no obvious distress.  Vital Signs: Temperature 98.2, pulse 69, respiratory rate 18, blood pressure 149/73, weight is 155 pounds.  Head and Neck:  Normocephalic, atraumatic skull. There are no ocular or oral lesions.  There are no palpable cervical or supraclavicular lymph nodes.  Lungs:  Clear bilaterally.  Cardiac: Regular rate and rhythm with a normal S1, S2.  There are no murmurs, rubs, or bruits.  Abdomen:  Soft.  He has well-healed laparotomy scar. There is no fluid wave.  There is no guarding or rebound tenderness. There is no palpable hepatosplenomegaly.  Back:  No tenderness over the spine, ribs, or hips.  Extremities:  No clubbing, cyanosis, or edema. Neurologic:  No focal neurological deficits.  LABORATORY STUDIES:  White cell count is 6.6, hemoglobin  12.4, hematocrit 39.2, and platelet count 122,000.  Ferritin is 4 now, his iron saturation is 25%, prealbumin is 24.1, and testosterone is 270.  IMPRESSION:  [name] is a nice 79 year old gentleman.  He has a history of resected stage Ia adenocarcinoma of the left lung.  This was resected back in January 2010.  I would believe that his risk of recurrence is going to be less than 10%.  He still would like to have his scans done every six months.  I certainly do not see a problem with this.  We would typically get his scans done the day we see him.  I will plan to see him back in three months.  Again, we do not need any scans for six months.    ______________________________ Josph Macho, M.D. PRE/MEDQ  D:  01/27/2013  T:  01/28/2013  Job:  [id number]

## 2013-01-29 LAB — TESTOSTERONE: Testosterone: 270 ng/dL — ABNORMAL LOW (ref 300–890)

## 2013-01-29 LAB — PREALBUMIN: Prealbumin: 24.1 mg/dL (ref 17.0–34.0)

## 2013-01-30 ENCOUNTER — Telehealth: Payer: Self-pay | Admitting: Nurse Practitioner

## 2013-01-30 ENCOUNTER — Telehealth: Payer: Self-pay | Admitting: Hematology & Oncology

## 2013-01-30 NOTE — Telephone Encounter (Addendum)
Message copied by Glee Arvin on Wed Jan 30, 2013 10:59 AM ------      Message from: Josph Macho      Created: Tue Jan 29, 2013 10:08 PM       Call - iron is low again.  Please set up feraheme 1020mg   1 dose in 1-2 weeks. Pete ------Spoke w/pts wife she verbalized understanding and will check his schedule and have him or her to call back to schedule appointment with Raiford Noble.

## 2013-01-30 NOTE — Telephone Encounter (Signed)
Pt made 12-30 iron infusion

## 2013-02-05 ENCOUNTER — Ambulatory Visit (HOSPITAL_BASED_OUTPATIENT_CLINIC_OR_DEPARTMENT_OTHER): Payer: Medicare Other

## 2013-02-05 VITALS — BP 145/93 | HR 77 | Temp 97.8°F | Resp 18

## 2013-02-05 DIAGNOSIS — D509 Iron deficiency anemia, unspecified: Secondary | ICD-10-CM

## 2013-02-05 MED ORDER — SODIUM CHLORIDE 0.9 % IV SOLN
1020.0000 mg | Freq: Once | INTRAVENOUS | Status: AC
  Administered 2013-02-05: 1020 mg via INTRAVENOUS
  Filled 2013-02-05: qty 34

## 2013-02-05 MED ORDER — SODIUM CHLORIDE 0.9 % IV SOLN
Freq: Once | INTRAVENOUS | Status: AC
  Administered 2013-02-05: 12:00:00 via INTRAVENOUS

## 2013-02-05 NOTE — Patient Instructions (Signed)

## 2013-02-21 ENCOUNTER — Ambulatory Visit (INDEPENDENT_AMBULATORY_CARE_PROVIDER_SITE_OTHER): Payer: Medicare Other | Admitting: Pharmacist Clinician (PhC)/ Clinical Pharmacy Specialist

## 2013-02-21 VITALS — BP 130/68 | HR 68

## 2013-02-21 DIAGNOSIS — Z7901 Long term (current) use of anticoagulants: Secondary | ICD-10-CM

## 2013-02-21 DIAGNOSIS — I4891 Unspecified atrial fibrillation: Secondary | ICD-10-CM

## 2013-02-21 LAB — POCT INR: INR: 2

## 2013-04-04 ENCOUNTER — Ambulatory Visit: Payer: Medicare Other | Admitting: Pharmacist Clinician (PhC)/ Clinical Pharmacy Specialist

## 2013-04-09 ENCOUNTER — Ambulatory Visit (INDEPENDENT_AMBULATORY_CARE_PROVIDER_SITE_OTHER): Payer: Medicare Other | Admitting: Pharmacist Clinician (PhC)/ Clinical Pharmacy Specialist

## 2013-04-09 VITALS — BP 160/88 | HR 72

## 2013-04-09 DIAGNOSIS — Z7901 Long term (current) use of anticoagulants: Secondary | ICD-10-CM

## 2013-04-09 DIAGNOSIS — I4891 Unspecified atrial fibrillation: Secondary | ICD-10-CM

## 2013-04-09 LAB — POCT INR: INR: 2.9

## 2013-04-29 ENCOUNTER — Ambulatory Visit (HOSPITAL_BASED_OUTPATIENT_CLINIC_OR_DEPARTMENT_OTHER): Payer: Medicare Other | Admitting: Hematology & Oncology

## 2013-04-29 ENCOUNTER — Other Ambulatory Visit (HOSPITAL_BASED_OUTPATIENT_CLINIC_OR_DEPARTMENT_OTHER): Payer: Medicare Other | Admitting: Lab

## 2013-04-29 ENCOUNTER — Encounter: Payer: Self-pay | Admitting: Hematology & Oncology

## 2013-04-29 VITALS — BP 146/72 | HR 67 | Temp 97.4°F | Resp 18 | Ht 67.0 in | Wt 155.0 lb

## 2013-04-29 DIAGNOSIS — C241 Malignant neoplasm of ampulla of Vater: Secondary | ICD-10-CM

## 2013-04-29 DIAGNOSIS — C3430 Malignant neoplasm of lower lobe, unspecified bronchus or lung: Secondary | ICD-10-CM

## 2013-04-29 DIAGNOSIS — D508 Other iron deficiency anemias: Secondary | ICD-10-CM

## 2013-04-29 DIAGNOSIS — Z8589 Personal history of malignant neoplasm of other organs and systems: Secondary | ICD-10-CM

## 2013-04-29 DIAGNOSIS — D509 Iron deficiency anemia, unspecified: Secondary | ICD-10-CM

## 2013-04-29 DIAGNOSIS — E291 Testicular hypofunction: Secondary | ICD-10-CM

## 2013-04-29 LAB — RETICULOCYTES (CHCC)
ABS Retic: 79.8 10*3/uL (ref 19.0–186.0)
RBC.: 4.2 MIL/uL — AB (ref 4.22–5.81)
Retic Ct Pct: 1.9 % (ref 0.4–2.3)

## 2013-04-29 LAB — CBC WITH DIFFERENTIAL (CANCER CENTER ONLY)
BASO#: 0.1 10*3/uL (ref 0.0–0.2)
BASO%: 0.9 % (ref 0.0–2.0)
EOS%: 7.3 % — ABNORMAL HIGH (ref 0.0–7.0)
Eosinophils Absolute: 0.5 10*3/uL (ref 0.0–0.5)
HEMATOCRIT: 40.6 % (ref 38.7–49.9)
HGB: 13.1 g/dL (ref 13.0–17.1)
LYMPH#: 1.1 10*3/uL (ref 0.9–3.3)
LYMPH%: 15.6 % (ref 14.0–48.0)
MCH: 31.3 pg (ref 28.0–33.4)
MCHC: 32.3 g/dL (ref 32.0–35.9)
MCV: 97 fL (ref 82–98)
MONO#: 0.8 10*3/uL (ref 0.1–0.9)
MONO%: 12.4 % (ref 0.0–13.0)
NEUT#: 4.3 10*3/uL (ref 1.5–6.5)
NEUT%: 63.8 % (ref 40.0–80.0)
Platelets: 127 10*3/uL — ABNORMAL LOW (ref 145–400)
RBC: 4.19 10*6/uL — ABNORMAL LOW (ref 4.20–5.70)
RDW: 14.2 % (ref 11.1–15.7)
WBC: 6.7 10*3/uL (ref 4.0–10.0)

## 2013-04-29 LAB — CMP (CANCER CENTER ONLY)
ALT(SGPT): 20 U/L (ref 10–47)
AST: 19 U/L (ref 11–38)
Albumin: 3.5 g/dL (ref 3.3–5.5)
Alkaline Phosphatase: 65 U/L (ref 26–84)
BUN: 17 mg/dL (ref 7–22)
CALCIUM: 9.2 mg/dL (ref 8.0–10.3)
CHLORIDE: 100 meq/L (ref 98–108)
CO2: 33 mEq/L (ref 18–33)
Creat: 1 mg/dl (ref 0.6–1.2)
GLUCOSE: 175 mg/dL — AB (ref 73–118)
Potassium: 4 mEq/L (ref 3.3–4.7)
Sodium: 143 mEq/L (ref 128–145)
Total Bilirubin: 0.7 mg/dl (ref 0.20–1.60)
Total Protein: 7.2 g/dL (ref 6.4–8.1)

## 2013-04-29 NOTE — Progress Notes (Signed)
Hematology and Oncology Follow Up Visit  [name] [id number] 21-Feb-1933 80 y.o. 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 2. 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 3. Recurrent iron-deficiency anemia. 4. Hypotestosteronemia.  Current Therapy:   IV iron as indicated.     Interim History:  [name] is back for followup. He is looking great. He's had no problems his last saw him in December. He's a transferrin receptor was 2.5. He did get a dose of Feraheme in late December.  He had no abdominal pain. He's had no cough. He is watching his blood sugars. He's also had hypothyroidism. He's had no problems with weight loss or weight gain. He still work in pretty much full-time. He is family travel quite a bit.. They were down in the [place] this winter and had a good time.  Medications: Current outpatient prescriptions:atorvastatin (LIPITOR) 10 MG tablet, 10 mg daily. Pt states he takes 3 or 4 times a week., Disp: , Rfl: ;  digoxin (LANOXIN) 0.125 MG tablet, Take 125 mcg by mouth daily. , Disp: , Rfl: ;  diltiazem (CARDIZEM CD) 240 MG 24 hr capsule, Take 240 mg by mouth daily., Disp: , Rfl: ;  glimepiride (AMARYL) 2 MG tablet, Take 2 mg by mouth daily before breakfast., Disp: , Rfl:  Lancet Device MISC, every morning. , Disp: , Rfl: ;  levothyroxine (SYNTHROID, LEVOTHROID) 112 MCG tablet, Take 112 mcg by mouth daily., Disp: , Rfl: ;  metFORMIN (GLUCOPHAGE) 500 MG tablet, Take 500 mg by mouth daily with breakfast. , Disp: , Rfl: ;  zolpidem (AMBIEN) 10 MG tablet, Take 10 mg by mouth as needed. , Disp: , Rfl:  warfarin (COUMADIN) 5 MG tablet, Take 5 mg by mouth daily. 4 DAYS  A WEEK 5 MG TAB, AND 3 DAYS A WEEK  TAKES 1/2 TAB 2.5 MG., Disp: , Rfl:   Allergies: No Known Allergies  Past Medical History, Surgical history, Social history, and Family History were reviewed and updated.  Review of Systems: As above  Physical  Exam:  height is 5\' 7"  (1.702 m) and weight is 155 lb (70.308 kg). His oral temperature is 97.4 F (36.3 C). His blood pressure is 146/72 and his pulse is 67. His respiration is 18.   Well-developed well-nourished gentleman. Head and neck exam shows no adenopathy. There is no ocular or oral lesions. Lungs are clear. Cardiac exam regular rate rhythm. He does have an occasional extra beat. There is history of fibrillation. Abdomen soft. He has good laparotomy scar. This is well-healed. He has no fluid wave. There is no palpable liver or spleen tip. Back exam no tenderness over the spine ribs or hips. Extremities no clubbing cyanosis or edema. Skin shows no ecchymosis or petechia. Neurological exam no focal neurological deficits.  Lab Results  Component Value Date   WBC 6.7 04/29/2013   HGB 13.1 04/29/2013   HCT 40.6 04/29/2013   MCV 97 04/29/2013   PLT 127* 04/29/2013     Chemistry      Component Value Date/Time   NA 143 04/29/2013 1134   NA 139 11/02/2012 1124   K 4.0 04/29/2013 1134   K 4.3 11/02/2012 1124   CL 100 04/29/2013 1134   CL 101 11/02/2012 1124   CO2 33 04/29/2013 1134   CO2 32 11/02/2012 1124   BUN 17 04/29/2013 1134   BUN 18 11/02/2012 1124   CREATININE 1.0 04/29/2013 1134  CREATININE 0.99 11/02/2012 1124      Component Value Date/Time   CALCIUM 9.2 04/29/2013 1134   CALCIUM 9.4 11/02/2012 1124   ALKPHOS 65 04/29/2013 1134   ALKPHOS 67 11/02/2012 1124   AST 19 04/29/2013 1134   AST 17 11/02/2012 1124   ALT 20 04/29/2013 1134   ALT 17 11/02/2012 1124   BILITOT 0.70 04/29/2013 1134   BILITOT 0.7 11/02/2012 1124         Impression and Plan: [name] is 80 year old abdomen. He has a past history of a stage III pancreatic cancer(ampulla of Vater). This was back in 2004. He then had a stage I lung cancer of the left lung resected in January of 2010.  His last CT scan was done in December and everything looked fine.  We do not need to do any more scans on him. His been 5 years  since he had been lung cancer resected. I think chest x-ray would be appropriate.  He does have a problem iron deficiency. I think this is more [name] worsened than anything else. We will see was iron levels are.  I'll see him back in another 3 months.  I reviewed his scans with him. I went over his lab work with him. We made the appointment for his chest x-ray to be done when I see him back myself in June.  George Napoleon, MD 3/23/20151:52 PM

## 2013-04-30 LAB — IRON AND TIBC CHCC
%SAT: 31 % (ref 20–55)
Iron: 75 ug/dL (ref 42–163)
TIBC: 243 ug/dL (ref 202–409)
UIBC: 167 ug/dL (ref 117–376)

## 2013-04-30 LAB — FERRITIN CHCC: Ferritin: 228 ng/ml (ref 22–316)

## 2013-05-02 ENCOUNTER — Telehealth: Payer: Self-pay | Admitting: *Deleted

## 2013-05-02 NOTE — Telephone Encounter (Signed)
Message copied by Rico Ala on Thu May 02, 2013  5:06 PM ------      Message from: Burney Gauze R      Created: Wed May 01, 2013  6:34 PM       Please call him and let him know that the iron is okay. Thanks. Pete ------

## 2013-05-22 ENCOUNTER — Ambulatory Visit (INDEPENDENT_AMBULATORY_CARE_PROVIDER_SITE_OTHER): Payer: Medicare Other | Admitting: Pharmacist Clinician (PhC)/ Clinical Pharmacy Specialist

## 2013-05-22 VITALS — BP 138/86 | HR 84

## 2013-05-22 DIAGNOSIS — I4891 Unspecified atrial fibrillation: Secondary | ICD-10-CM

## 2013-05-22 DIAGNOSIS — Z7901 Long term (current) use of anticoagulants: Secondary | ICD-10-CM

## 2013-05-22 LAB — POCT INR: INR: 2.8

## 2013-07-03 ENCOUNTER — Ambulatory Visit (INDEPENDENT_AMBULATORY_CARE_PROVIDER_SITE_OTHER): Payer: Medicare Other | Admitting: Pharmacist Clinician (PhC)/ Clinical Pharmacy Specialist

## 2013-07-03 VITALS — BP 152/80 | HR 72

## 2013-07-03 DIAGNOSIS — Z7901 Long term (current) use of anticoagulants: Secondary | ICD-10-CM

## 2013-07-03 DIAGNOSIS — I4891 Unspecified atrial fibrillation: Secondary | ICD-10-CM

## 2013-07-03 LAB — POCT INR: INR: 2.9

## 2013-07-18 ENCOUNTER — Encounter: Payer: Self-pay | Admitting: Hematology & Oncology

## 2013-07-18 ENCOUNTER — Other Ambulatory Visit (HOSPITAL_BASED_OUTPATIENT_CLINIC_OR_DEPARTMENT_OTHER): Payer: Medicare Other | Admitting: Lab

## 2013-07-18 ENCOUNTER — Ambulatory Visit (HOSPITAL_BASED_OUTPATIENT_CLINIC_OR_DEPARTMENT_OTHER): Payer: Medicare Other | Admitting: Hematology & Oncology

## 2013-07-18 ENCOUNTER — Ambulatory Visit (HOSPITAL_BASED_OUTPATIENT_CLINIC_OR_DEPARTMENT_OTHER)
Admission: RE | Admit: 2013-07-18 | Discharge: 2013-07-18 | Disposition: A | Discharge status: Home / Self Care | Payer: Medicare Other | Source: Ambulatory Visit | Attending: Hematology & Oncology | Admitting: Hematology & Oncology

## 2013-07-18 ENCOUNTER — Ambulatory Visit (HOSPITAL_COMMUNITY): Payer: Medicare Other

## 2013-07-18 VITALS — BP 136/67 | HR 57 | Temp 98.1°F | Resp 18 | Ht 67.0 in | Wt 154.0 lb

## 2013-07-18 DIAGNOSIS — E039 Hypothyroidism, unspecified: Secondary | ICD-10-CM

## 2013-07-18 DIAGNOSIS — C3430 Malignant neoplasm of lower lobe, unspecified bronchus or lung: Secondary | ICD-10-CM

## 2013-07-18 DIAGNOSIS — C241 Malignant neoplasm of ampulla of Vater: Secondary | ICD-10-CM

## 2013-07-18 DIAGNOSIS — D509 Iron deficiency anemia, unspecified: Secondary | ICD-10-CM

## 2013-07-18 DIAGNOSIS — C3410 Malignant neoplasm of upper lobe, unspecified bronchus or lung: Secondary | ICD-10-CM | POA: Insufficient documentation

## 2013-07-18 LAB — CBC WITH DIFFERENTIAL (CANCER CENTER ONLY)
BASO#: 0 10*3/uL (ref 0.0–0.2)
BASO%: 0.5 % (ref 0.0–2.0)
EOS%: 6.4 % (ref 0.0–7.0)
Eosinophils Absolute: 0.4 10*3/uL (ref 0.0–0.5)
HEMATOCRIT: 37.5 % — AB (ref 38.7–49.9)
HEMOGLOBIN: 12.4 g/dL — AB (ref 13.0–17.1)
LYMPH#: 1.1 10*3/uL (ref 0.9–3.3)
LYMPH%: 17.3 % (ref 14.0–48.0)
MCH: 31.4 pg (ref 28.0–33.4)
MCHC: 33.1 g/dL (ref 32.0–35.9)
MCV: 95 fL (ref 82–98)
MONO#: 0.7 10*3/uL (ref 0.1–0.9)
MONO%: 10 % (ref 0.0–13.0)
NEUT#: 4.3 10*3/uL (ref 1.5–6.5)
NEUT%: 65.8 % (ref 40.0–80.0)
Platelets: 122 10*3/uL — ABNORMAL LOW (ref 145–400)
RBC: 3.95 10*6/uL — ABNORMAL LOW (ref 4.20–5.70)
RDW: 13.1 % (ref 11.1–15.7)
WBC: 6.6 10*3/uL (ref 4.0–10.0)

## 2013-07-18 LAB — CMP (CANCER CENTER ONLY)
ALK PHOS: 49 U/L (ref 26–84)
ALT(SGPT): 27 U/L (ref 10–47)
AST: 20 U/L (ref 11–38)
Albumin: 3.5 g/dL (ref 3.3–5.5)
BILIRUBIN TOTAL: 0.6 mg/dL (ref 0.20–1.60)
BUN, Bld: 18 mg/dL (ref 7–22)
CO2: 30 mEq/L (ref 18–33)
Calcium: 9.1 mg/dL (ref 8.0–10.3)
Chloride: 101 mEq/L (ref 98–108)
Creat: 1.1 mg/dl (ref 0.6–1.2)
GLUCOSE: 166 mg/dL — AB (ref 73–118)
POTASSIUM: 4 meq/L (ref 3.3–4.7)
SODIUM: 140 meq/L (ref 128–145)
TOTAL PROTEIN: 7.1 g/dL (ref 6.4–8.1)

## 2013-07-18 LAB — CHCC SATELLITE - SMEAR

## 2013-07-18 NOTE — Progress Notes (Signed)
Hematology and Oncology Follow Up Visit  [name] [id number] [birth year] 80 y.o. 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 2. 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 3. Recurrent iron-deficiency anemia. 4. Hypotestosteronemia.  Current Therapy:   IV iron as indicated.     Interim History:  [name] is back for followup. Last saw him back in March. He last got iron back in December.  Had a chest x-ray done today. This showed a stable left pulmonary nodule. Had a less distinct right lower lobe nodule. Again all is stable.  I think that his biggest problem probably is the diabetes. It's hard to say how well this is controlled.  He does have hypo-thyroidism. He is not sure when his thyroid has been checked. I am sure that this is not been checked on a regular basis by [name].  He's had no pain. He had no change in bowel bladder habits. He's had no bleeding on Coumadin.  Medications: Current outpatient prescriptions:atorvastatin (LIPITOR) 10 MG tablet, 10 mg daily. Pt states he takes 3 or 4 times a week., Disp: , Rfl: ;  digoxin (LANOXIN) 0.125 MG tablet, Take 125 mcg by mouth daily. , Disp: , Rfl: ;  diltiazem (CARDIZEM CD) 240 MG 24 hr capsule, Take 240 mg by mouth daily., Disp: , Rfl: ;  econazole nitrate 1 % cream, Apply as directed, Disp: , Rfl:  glimepiride (AMARYL) 2 MG tablet, Take 2 mg by mouth daily before breakfast., Disp: , Rfl: ;  Lancet Device MISC, every morning. , Disp: , Rfl: ;  levothyroxine (SYNTHROID, LEVOTHROID) 112 MCG tablet, Take 112 mcg by mouth daily., Disp: , Rfl: ;  metFORMIN (GLUCOPHAGE) 500 MG tablet, Take 500 mg by mouth daily with breakfast. , Disp: , Rfl:  warfarin (COUMADIN) 5 MG tablet, Take 5 mg by mouth daily. 4 DAYS  A WEEK 5 MG TAB, AND 3 DAYS A WEEK  TAKES 1/2 TAB 2.5 MG., Disp: , Rfl: ;  zolpidem (AMBIEN) 10 MG tablet, Take 10 mg by mouth as needed. , Disp: , Rfl:    Allergies: No Known Allergies  Past Medical History, Surgical history, Social history, and Family History were reviewed and updated.  Review of Systems: As above  Physical Exam:  height is 5\' 7"  (1.702 m) and weight is 154 lb (69.854 kg). His oral temperature is 98.1 F (36.7 C). His blood pressure is 136/67 and his pulse is 57. His respiration is 18.   Well-developed and well-nourished gentleman. He has no ocular or oral lesions. There is no adenopathy in the neck. Lungs are clear. Cardiac exam regular in rhythm. He has no murmurs. Abdomen is soft. Has well-healed laparotomy scar. He has no fluid wave. There is no palpable liver or spleen. Back exam no tenderness over the spine. Extremities shows no clubbing cyanosis or edema. Skin exam no rashes.  Lab Results  Component Value Date   WBC 6.6 07/18/2013   HGB 12.4* 07/18/2013   HCT 37.5* 07/18/2013   MCV 95 07/18/2013   PLT 122* 07/18/2013     Chemistry      Component Value Date/Time   NA 140 07/18/2013 1440   NA 139 11/02/2012 1124   K 4.0 07/18/2013 1440   K 4.3 11/02/2012 1124   CL 101 07/18/2013 1440   CL 101 11/02/2012 1124   CO2 30 07/18/2013 1440   CO2 32 11/02/2012 1124   BUN 18 07/18/2013  1440   BUN 18 11/02/2012 1124   CREATININE 1.1 07/18/2013 1440   CREATININE 0.99 11/02/2012 1124      Component Value Date/Time   CALCIUM 9.1 07/18/2013 1440   CALCIUM 9.4 11/02/2012 1124   ALKPHOS 49 07/18/2013 1440   ALKPHOS 67 11/02/2012 1124   AST 20 07/18/2013 1440   AST 17 11/02/2012 1124   ALT 27 07/18/2013 1440   ALT 17 11/02/2012 1124   BILITOT 0.60 07/18/2013 1440   BILITOT 0.7 11/02/2012 1124         Impression and Plan: [name] is 80 year old gentleman. He had a history of stage III adenocarcinoma of the pancreas. This was probably 10 years ago. He then had a stage I lung cancer resected in January of 2010.  His chest x-ray from my point of view looks okay. The changes are stable. I just don't think that he has  recurrence.  We have to see what his iron studies show. I suspect that his iron probably is lower. I just don't think that he absorbs iron all that well because of all his medications.  I will probably see him back in 3 months. We will make sure to call him with his iron results and his chest x-ray result.  [name], MD 6/11/20156:25 PM

## 2013-07-19 LAB — FERRITIN CHCC: FERRITIN: 161 ng/mL (ref 22–316)

## 2013-07-19 LAB — IRON AND TIBC CHCC
%SAT: 24 % (ref 20–55)
Iron: 60 ug/dL (ref 42–163)
TIBC: 255 ug/dL (ref 202–409)
UIBC: 195 ug/dL (ref 117–376)

## 2013-07-19 LAB — TSH CHCC: TSH: 0.314 m(IU)/L — ABNORMAL LOW (ref 0.320–4.118)

## 2013-07-22 ENCOUNTER — Telehealth: Payer: Self-pay | Admitting: Hematology & Oncology

## 2013-07-22 NOTE — Telephone Encounter (Signed)
Left message with wife for pt to call and schedule iron infusion

## 2013-08-07 ENCOUNTER — Ambulatory Visit (INDEPENDENT_AMBULATORY_CARE_PROVIDER_SITE_OTHER): Payer: Medicare Other | Admitting: Pharmacist Clinician (PhC)/ Clinical Pharmacy Specialist

## 2013-08-07 DIAGNOSIS — I4891 Unspecified atrial fibrillation: Secondary | ICD-10-CM

## 2013-08-07 DIAGNOSIS — Z7901 Long term (current) use of anticoagulants: Secondary | ICD-10-CM

## 2013-08-07 LAB — POCT INR: INR: 2.2

## 2013-09-18 ENCOUNTER — Ambulatory Visit (INDEPENDENT_AMBULATORY_CARE_PROVIDER_SITE_OTHER): Payer: Medicare Other | Admitting: Pharmacist Clinician (PhC)/ Clinical Pharmacy Specialist

## 2013-09-18 DIAGNOSIS — Z7901 Long term (current) use of anticoagulants: Secondary | ICD-10-CM

## 2013-09-18 DIAGNOSIS — I4891 Unspecified atrial fibrillation: Secondary | ICD-10-CM

## 2013-09-18 LAB — POCT INR: INR: 2.2

## 2013-09-24 ENCOUNTER — Ambulatory Visit (INDEPENDENT_AMBULATORY_CARE_PROVIDER_SITE_OTHER): Payer: Medicare Other | Admitting: Family Medicine

## 2013-09-24 VITALS — BP 150/80 | HR 88 | Temp 97.6°F | Resp 18

## 2013-09-24 DIAGNOSIS — S61219A Laceration without foreign body of unspecified finger without damage to nail, initial encounter: Secondary | ICD-10-CM

## 2013-09-24 DIAGNOSIS — S65209A Unspecified injury of superficial palmar arch of unspecified hand, initial encounter: Secondary | ICD-10-CM

## 2013-09-24 DIAGNOSIS — M79609 Pain in unspecified limb: Secondary | ICD-10-CM

## 2013-09-24 DIAGNOSIS — S61209A Unspecified open wound of unspecified finger without damage to nail, initial encounter: Secondary | ICD-10-CM

## 2013-09-24 DIAGNOSIS — M79603 Pain in arm, unspecified: Secondary | ICD-10-CM

## 2013-09-24 DIAGNOSIS — S65312A Laceration of deep palmar arch of left hand, initial encounter: Secondary | ICD-10-CM

## 2013-09-24 MED ORDER — HYDROCODONE-ACETAMINOPHEN 5-325 MG PO TABS: 1.0000 | ORAL_TABLET | Freq: Four times a day (QID) | ORAL | Status: DC | PRN

## 2013-09-24 NOTE — Progress Notes (Signed)
This chart was scribed for Robyn Haber, MD by Einar Pheasant, ED Scribe. This patient was seen in room 7 and the patient's care was started at 5:53 PM.  Patient ID: [name] MRN: [id number], DOB: November 30, 1933, 80 y.o. Date of Encounter: 09/24/2013, 5:56 PM  Primary Physician: Irven Shelling, MD  Chief Complaint:  Chief Complaint  Patient presents with  . Fall    pt indicated he tripped over an wire outside his office today and hurt left/right hand and shoulder     HPI: 80 y.o. year old male with history below   Pt comes to the office today complaining of a fall that happened today prior to arrival. 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 He states that after taking his BP he tripped over the cord of cords of his BP machine. Pt has several laceration to the left hand, one to the ring finger of his right hand, and one below his left elbow. He states that he is up to date on his tetanus. Pt is also complaining of left shoulder pain. He is able to move the shoulder but it is a little stiff. [name] also has an abrasion to the effected shoulder. He denies any fever, chills, diaphoresis, SOB, chest pain, abdominal pain, headache, head trauma, LOC, or visual disturbances.  Pt takes Coumadin daily.   Right ring finger with superficail laceration that I applied derma bond to. Another one located to the palmar aspect of the left hand. Dressing was applied.   Past Medical History  Diagnosis Date  . Diabetes mellitus   . Hypertension   . pancreatic ca dx'd 2004    oral chemo/xrt comp  . Lung cancer dx'd 02/2009    surg only  . Lung cancer, lower lobe 01/27/2013  . Cancer of ampulla of Vater 01/27/2013  . Iron deficiency anemia, unspecified 01/27/2013     Home Meds: Prior to Admission medications   Medication Sig Start Date End Date Taking? Authorizing Provider  atorvastatin (LIPITOR) 10 MG tablet 10 mg daily. Pt states he takes 3 or 4 times a week. 09/22/11  Yes  Historical Provider, MD  glimepiride (AMARYL) 2 MG tablet Take 2 mg by mouth daily before breakfast.   Yes Historical Provider, MD  Lancet Device MISC every morning.  09/22/11  Yes Historical Provider, MD  levothyroxine (SYNTHROID, LEVOTHROID) 112 MCG tablet Take 112 mcg by mouth daily.   Yes Historical Provider, MD  metFORMIN (GLUCOPHAGE) 500 MG tablet Take 500 mg by mouth daily with breakfast.    Yes Historical Provider, MD  warfarin (COUMADIN) 5 MG tablet Take 5 mg by mouth daily. 4 DAYS  A WEEK 5 MG TAB, AND 3 DAYS A WEEK  TAKES 1/2 TAB 2.5 MG.   Yes Historical Provider, MD  zolpidem (AMBIEN) 10 MG tablet Take 10 mg by mouth as needed.  01/24/13  Yes Historical Provider, MD  digoxin (LANOXIN) 0.125 MG tablet Take 125 mcg by mouth daily.     Historical Provider, MD  diltiazem (CARDIZEM CD) 240 MG 24 hr capsule Take 240 mg by mouth daily.    Historical Provider, MD  econazole nitrate 1 % cream Apply as directed 05/07/13   Historical Provider, MD    Allergies: No Known Allergies  History   Social History  . Marital Status: Married    Spouse Name: N/A    Number of Children: N/A  . Years of Education: N/A   Occupational History  . Not on file.  Social History Main Topics  . Smoking status: Former Smoker -- 2.00 packs/day for 7 years    Types: Cigarettes    Start date: 09/30/1950    Quit date: 04/29/1957  . Smokeless tobacco: Never Used     Comment: quit 54 years ago  . Alcohol Use: Not on file  . Drug Use: Not on file  . Sexual Activity: Not on file   Other Topics Concern  . Not on file   Social History Narrative  . No narrative on file     Review of Systems: positive for wound. Constitutional: negative for chills, fever, night sweats, weight changes, or fatigue  HEENT: negative for vision changes, hearing loss, congestion, rhinorrhea, ST, epistaxis, or sinus pressure Cardiovascular: negative for chest pain or palpitations Respiratory: negative for hemoptysis, wheezing,  shortness of breath, or cough Abdominal: negative for abdominal pain, nausea, vomiting, diarrhea, or constipation Dermatological: negative for rash Neurologic: negative for headache, dizziness, or syncope All other systems reviewed and are otherwise negative with the exception to those above and in the HPI.   Physical Exam: Right ring finger with superficail laceration that I applied derma bond to. Another one located to the palmar aspect of the left hand. Dressing was applied.   Blood pressure 150/80, pulse 88, temperature 97.6 F (36.4 C), temperature source Oral, resp. rate 18, SpO2 98.00%., There is no weight on file to calculate BMI. General: Well developed, well nourished, in no acute distress. Head: Normocephalic, atraumatic, eyes without discharge, sclera non-icteric, nares are without discharge. Bilateral auditory canals clear, TM's are without perforation, pearly grey and translucent with reflective cone of light bilaterally. Oral cavity moist, posterior pharynx without exudate, erythema, peritonsillar abscess, or post nasal drip.  Neck: Supple. No thyromegaly. Full ROM. No lymphadenopathy. Lungs: Clear bilaterally to auscultation without wheezes, rales, or rhonchi. Breathing is unlabored. Heart: RRR with S1 S2. No murmurs, rubs, or gallops appreciated. Abdomen: Soft, non-tender, non-distended with normoactive bowel sounds. No hepatomegaly. No rebound/guarding. No obvious abdominal masses. Msk:  Strength and tone normal for age. Extremities/Skin: Warm and dry. No clubbing or cyanosis. No edema. No rashes or suspicious lesions. Neuro: Alert and oriented X 3. Moves all extremities spontaneously. Gait is normal. CNII-XII grossly in tact. Psych:  Responds to questions appropriately with a normal affect.     ASSESSMENT AND PLAN:  80 y.o. year old male with laceration to right ring finger, left palm, and left proximal forearm. These were all superficial and did not involve any  neurological, deep vascular, or tendon structures.  Patient has been derma bonded and will return tomorrow he has any problems.  In addition patient has sprained his left shoulder and we'll need to take hydrocodone when necessary and work on range of motion as I expect this will be sore for the next 2 weeks. He'll return if he has any questions or persisting pain.    I personally performed the services described in this documentation, which was scribed in my presence. The recorded information has been reviewed and is accurate.  Signed, Robyn Haber, MD 09/24/2013 5:56 PM

## 2013-09-25 ENCOUNTER — Ambulatory Visit (INDEPENDENT_AMBULATORY_CARE_PROVIDER_SITE_OTHER): Payer: Medicare Other | Admitting: Family Medicine

## 2013-09-25 VITALS — BP 128/70 | HR 55 | Temp 97.7°F | Resp 18 | Ht 68.0 in | Wt 153.0 lb

## 2013-09-25 DIAGNOSIS — S61409A Unspecified open wound of unspecified hand, initial encounter: Secondary | ICD-10-CM

## 2013-09-25 DIAGNOSIS — Z5189 Encounter for other specified aftercare: Secondary | ICD-10-CM

## 2013-09-25 DIAGNOSIS — S61409D Unspecified open wound of unspecified hand, subsequent encounter: Secondary | ICD-10-CM

## 2013-09-25 NOTE — Progress Notes (Signed)
° °  Subjective:    Patient ID: [name], male    DOB: 1933/08/02, 80 y.o.   MRN: [id number] Chief Complaint  Patient presents with   Wound Check    Wound Check   [name] is a 80 y.o. male Pres[facility] for wound re-check. He states that his wounds are improving since yesterday. He denies any new or worsening symptoms. He denies any pain.    Review of Systems  Skin: Positive for wound.    Objective:   BP 128/70   Pulse 55   Temp(Src) 97.7 F (36.5 C) (Oral)   Resp 18   Ht 5\' 8"  (1.727 m)   Wt 153 lb (69.4 kg)   BMI 23.27 kg/m2   SpO2 96%   Physical Exam  Nursing note and vitals reviewed. Constitutional: He is oriented to person, place, and time. He appears well-developed and well-nourished. No distress.  HENT:  Head: Normocephalic and atraumatic.  Eyes: Conjunctivae and EOM are normal.  Neck: Neck supple. No tracheal deviation present.  Cardiovascular: Normal rate.   Pulmonary/Chest: Effort normal. No respiratory distress.  Musculoskeletal: Normal range of motion.  Neurological: He is alert and oriented to person, place, and time.  Skin: Skin is warm and dry.  Psychiatric: He has a normal mood and affect. His behavior is normal.   wounds were reexamined. There is no significant swelling or tenderness. Dermabond was reapplied to secure the wound edges.  Assessment & Plan:   Open wound of hand with complication, unspecified laterality, subsequent encounter  Signed, Robyn Haber, MD

## 2013-10-23 ENCOUNTER — Encounter: Payer: Self-pay | Admitting: Hematology & Oncology

## 2013-10-23 ENCOUNTER — Ambulatory Visit (HOSPITAL_BASED_OUTPATIENT_CLINIC_OR_DEPARTMENT_OTHER): Payer: Medicare Other | Admitting: Hematology & Oncology

## 2013-10-23 ENCOUNTER — Other Ambulatory Visit (HOSPITAL_BASED_OUTPATIENT_CLINIC_OR_DEPARTMENT_OTHER): Payer: Medicare Other | Admitting: Lab

## 2013-10-23 VITALS — BP 139/73 | HR 73 | Temp 97.8°F | Resp 18 | Ht 68.0 in | Wt 149.0 lb

## 2013-10-23 DIAGNOSIS — Z8509 Personal history of malignant neoplasm of other digestive organs: Secondary | ICD-10-CM

## 2013-10-23 DIAGNOSIS — Z85118 Personal history of other malignant neoplasm of bronchus and lung: Secondary | ICD-10-CM

## 2013-10-23 DIAGNOSIS — D509 Iron deficiency anemia, unspecified: Secondary | ICD-10-CM

## 2013-10-23 DIAGNOSIS — E039 Hypothyroidism, unspecified: Secondary | ICD-10-CM

## 2013-10-23 DIAGNOSIS — I482 Chronic atrial fibrillation, unspecified: Secondary | ICD-10-CM

## 2013-10-23 DIAGNOSIS — C3432 Malignant neoplasm of lower lobe, left bronchus or lung: Secondary | ICD-10-CM

## 2013-10-23 LAB — COMPREHENSIVE METABOLIC PANEL
ALK PHOS: 56 U/L (ref 39–117)
ALT: 22 U/L (ref 0–53)
AST: 22 U/L (ref 0–37)
Albumin: 4.1 g/dL (ref 3.5–5.2)
BILIRUBIN TOTAL: 0.6 mg/dL (ref 0.2–1.2)
BUN: 16 mg/dL (ref 6–23)
CO2: 29 mEq/L (ref 19–32)
Calcium: 9.2 mg/dL (ref 8.4–10.5)
Chloride: 100 mEq/L (ref 96–112)
Creatinine, Ser: 1.16 mg/dL (ref 0.50–1.35)
GLUCOSE: 165 mg/dL — AB (ref 70–99)
Potassium: 4.2 mEq/L (ref 3.5–5.3)
SODIUM: 137 meq/L (ref 135–145)
TOTAL PROTEIN: 6.6 g/dL (ref 6.0–8.3)

## 2013-10-23 LAB — CBC WITH DIFFERENTIAL (CANCER CENTER ONLY)
BASO#: 0 10*3/uL (ref 0.0–0.2)
BASO%: 0.6 % (ref 0.0–2.0)
EOS ABS: 0.3 10*3/uL (ref 0.0–0.5)
EOS%: 3.9 % (ref 0.0–7.0)
HCT: 40 % (ref 38.7–49.9)
HEMOGLOBIN: 13.5 g/dL (ref 13.0–17.1)
LYMPH#: 1.1 10*3/uL (ref 0.9–3.3)
LYMPH%: 17 % (ref 14.0–48.0)
MCH: 31.5 pg (ref 28.0–33.4)
MCHC: 33.8 g/dL (ref 32.0–35.9)
MCV: 93 fL (ref 82–98)
MONO#: 0.8 10*3/uL (ref 0.1–0.9)
MONO%: 12.2 % (ref 0.0–13.0)
NEUT%: 66.3 % (ref 40.0–80.0)
NEUTROS ABS: 4.4 10*3/uL (ref 1.5–6.5)
Platelets: 121 10*3/uL — ABNORMAL LOW (ref 145–400)
RBC: 4.29 10*6/uL (ref 4.20–5.70)
RDW: 13.7 % (ref 11.1–15.7)
WBC: 6.7 10*3/uL (ref 4.0–10.0)

## 2013-10-23 NOTE — Progress Notes (Signed)
Hematology and Oncology Follow Up Visit  [name] [id number] [birth year] 80 y.o. 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 2. 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 3. Recurrent iron-deficiency anemia. 4. Hypotestosteronemia.  Current Therapy:   IV iron as indicated.     Interim History:  [name] is back for followup. She has really had a tough summer. Unfortunately, his wife has been in and out of the hospital with her bipolar attacks. She currently is in the hospital. She's been in the hospital for basically the past 6 weeks. Again, she gives out for about a week and has a flareup and relapse of the bipolar and then goes back in.  He lost weight. Is not you. Not sleeping all that well. There were to go ahead and give him some iron last time we saw him. This was back in June of. His iron was on the low side. He was responsible to [facility].  He's not had a problem bleeding. Did fall about 3 weeks ago. Had a large hematoma on the left neck. Also had some hematoma on the left chest wall. This all is improved.  He's had no problems bowels or bladder. There's been no leg swelling.Marland Kitchen He feels as if the atrial fibrillation is under fair good control. Medications: Current outpatient prescriptions:digoxin (LANOXIN) 0.125 MG tablet, Take 125 mcg by mouth daily. , Disp: , Rfl: ;  diltiazem (CARDIZEM CD) 240 MG 24 hr capsule, Take 240 mg by mouth daily., Disp: , Rfl: ;  econazole nitrate 1 % cream, Apply as directed, Disp: , Rfl: ;  Lancet Device MISC, every morning. , Disp: , Rfl: ;  levothyroxine (SYNTHROID, LEVOTHROID) 112 MCG tablet, Take 112 mcg by mouth daily., Disp: , Rfl:  metFORMIN (GLUCOPHAGE) 500 MG tablet, Take 500 mg by mouth daily with breakfast. , Disp: , Rfl: ;  warfarin (COUMADIN) 5 MG tablet, Take 5 mg by mouth daily. 4 DAYS  A WEEK 5 MG TAB, AND 3 DAYS A WEEK  TAKES 1/2 TAB 2.5 MG., Disp: , Rfl: ;  zolpidem  (AMBIEN) 10 MG tablet, Take 10 mg by mouth as needed. , Disp: , Rfl: ;  atorvastatin (LIPITOR) 10 MG tablet, 10 mg daily. Pt states he takes 3 or 4 times a week., Disp: , Rfl:  glimepiride (AMARYL) 2 MG tablet, Take 2 mg by mouth daily before breakfast., Disp: , Rfl:   Allergies: No Known Allergies  Past Medical History, Surgical history, Social history, and Family History were reviewed and updated.  Review of Systems: As above  Physical Exam:  height is 5\' 8"  (1.727 m) and weight is 149 lb (67.586 kg). His oral temperature is 97.8 F (36.6 C). His blood pressure is 139/73 and his pulse is 73. His respiration is 18.   Well-developed and well-nourished gentleman. He has no ocular or oral lesions. There is no adenopathy in the neck. Lungs are clear. Cardiac exam regular in rhythm. He has no murmurs. Abdomen is soft. Has well-healed laparotomy scar. He has no fluid wave. There is no palpable liver or spleen. Back exam no tenderness over the spine. Extremities shows no clubbing cyanosis or edema. Skin exam no rashes.  Lab Results  Component Value Date   WBC 6.7 10/23/2013   HGB 13.5 10/23/2013   HCT 40.0 10/23/2013   MCV 93 10/23/2013   PLT 121* 10/23/2013     Chemistry      Component  Value Date/Time   NA 140 07/18/2013 1440   NA 139 11/02/2012 1124   K 4.0 07/18/2013 1440   K 4.3 11/02/2012 1124   CL 101 07/18/2013 1440   CL 101 11/02/2012 1124   CO2 30 07/18/2013 1440   CO2 32 11/02/2012 1124   BUN 18 07/18/2013 1440   BUN 18 11/02/2012 1124   CREATININE 1.1 07/18/2013 1440   CREATININE 0.99 11/02/2012 1124      Component Value Date/Time   CALCIUM 9.1 07/18/2013 1440   CALCIUM 9.4 11/02/2012 1124   ALKPHOS 49 07/18/2013 1440   ALKPHOS 67 11/02/2012 1124   AST 20 07/18/2013 1440   AST 17 11/02/2012 1124   ALT 27 07/18/2013 1440   ALT 17 11/02/2012 1124   BILITOT 0.60 07/18/2013 1440   BILITOT 0.7 11/02/2012 1124         Impression and Plan: [name] is 80 year old gentleman. He had a  history of stage III adenocarcinoma of the pancreas. This was probably 10 years ago. He then had a stage I lung cancer resected in January of 2010.  His chest x-ray from my point of view looks okay. The changes are stable. I just don't think that he has recurrence.  We have to see what his iron studies show. I suspect that his iron probably is lower. I just don't think that he absorbs iron all that well because of all his medications.  I will probably see him back in 3 months. We will make sure to call him with his iron results and his chest x-ray result.  We will probably get a chest x-ray on him when we see him back in 3 months. Provide ultimately any CT scans on him.  He's worried about his muscle mass. He does not want to take any kind of testosterone.  [name], MD 9/16/20154:06 PM

## 2013-10-24 LAB — IRON AND TIBC CHCC
%SAT: 33 % (ref 20–55)
Iron: 84 ug/dL (ref 42–163)
TIBC: 250 ug/dL (ref 202–409)
UIBC: 166 ug/dL (ref 117–376)

## 2013-10-24 LAB — FERRITIN CHCC: Ferritin: 144 ng/ml (ref 22–316)

## 2013-10-25 ENCOUNTER — Telehealth: Payer: Self-pay | Admitting: *Deleted

## 2013-10-25 NOTE — Telephone Encounter (Signed)
Message copied by Rico Ala on Fri Oct 25, 2013  4:35 PM ------      Message from: Burney Gauze R      Created: Thu Oct 24, 2013  5:49 PM       Call - iron is ok!!  Laurey Arrow ------

## 2013-10-30 ENCOUNTER — Ambulatory Visit (INDEPENDENT_AMBULATORY_CARE_PROVIDER_SITE_OTHER): Payer: Medicare Other | Admitting: Pharmacist Clinician (PhC)/ Clinical Pharmacy Specialist

## 2013-10-30 DIAGNOSIS — Z7901 Long term (current) use of anticoagulants: Secondary | ICD-10-CM

## 2013-10-30 DIAGNOSIS — I4891 Unspecified atrial fibrillation: Secondary | ICD-10-CM

## 2013-10-30 LAB — POCT INR: INR: 1.6

## 2013-11-20 ENCOUNTER — Ambulatory Visit (INDEPENDENT_AMBULATORY_CARE_PROVIDER_SITE_OTHER): Payer: Medicare Other | Admitting: Pharmacist Clinician (PhC)/ Clinical Pharmacy Specialist

## 2013-11-20 DIAGNOSIS — I4891 Unspecified atrial fibrillation: Secondary | ICD-10-CM

## 2013-11-20 DIAGNOSIS — Z7901 Long term (current) use of anticoagulants: Secondary | ICD-10-CM

## 2013-11-20 LAB — POCT INR: INR: 1.7

## 2013-12-04 ENCOUNTER — Ambulatory Visit (INDEPENDENT_AMBULATORY_CARE_PROVIDER_SITE_OTHER): Payer: Medicare Other | Admitting: Pharmacist Clinician (PhC)/ Clinical Pharmacy Specialist

## 2013-12-04 DIAGNOSIS — Z7901 Long term (current) use of anticoagulants: Secondary | ICD-10-CM

## 2013-12-04 DIAGNOSIS — I4891 Unspecified atrial fibrillation: Secondary | ICD-10-CM

## 2013-12-04 LAB — POCT INR: INR: 2

## 2014-01-01 ENCOUNTER — Ambulatory Visit (INDEPENDENT_AMBULATORY_CARE_PROVIDER_SITE_OTHER): Payer: Medicare Other | Admitting: Pharmacist Clinician (PhC)/ Clinical Pharmacy Specialist

## 2014-01-01 DIAGNOSIS — Z7901 Long term (current) use of anticoagulants: Secondary | ICD-10-CM

## 2014-01-01 DIAGNOSIS — I4891 Unspecified atrial fibrillation: Secondary | ICD-10-CM

## 2014-01-01 LAB — POCT INR: INR: 2.1

## 2014-02-12 ENCOUNTER — Ambulatory Visit (INDEPENDENT_AMBULATORY_CARE_PROVIDER_SITE_OTHER): Payer: Medicare Other | Admitting: Pharmacist Clinician (PhC)/ Clinical Pharmacy Specialist

## 2014-02-12 DIAGNOSIS — I4891 Unspecified atrial fibrillation: Secondary | ICD-10-CM

## 2014-02-12 DIAGNOSIS — Z7901 Long term (current) use of anticoagulants: Secondary | ICD-10-CM

## 2014-02-12 LAB — POCT INR: INR: 1.8

## 2014-03-26 ENCOUNTER — Ambulatory Visit: Payer: Medicare Other | Admitting: Pharmacist Clinician (PhC)/ Clinical Pharmacy Specialist

## 2014-04-02 ENCOUNTER — Ambulatory Visit (INDEPENDENT_AMBULATORY_CARE_PROVIDER_SITE_OTHER): Payer: Medicare Other | Admitting: Pharmacist Clinician (PhC)/ Clinical Pharmacy Specialist

## 2014-04-02 DIAGNOSIS — Z7901 Long term (current) use of anticoagulants: Secondary | ICD-10-CM

## 2014-04-02 DIAGNOSIS — I4891 Unspecified atrial fibrillation: Secondary | ICD-10-CM

## 2014-04-02 LAB — POCT INR: INR: 1.6

## 2014-04-28 ENCOUNTER — Ambulatory Visit (INDEPENDENT_AMBULATORY_CARE_PROVIDER_SITE_OTHER): Payer: Medicare Other | Admitting: Pharmacist Clinician (PhC)/ Clinical Pharmacy Specialist

## 2014-04-28 DIAGNOSIS — Z7901 Long term (current) use of anticoagulants: Secondary | ICD-10-CM

## 2014-04-28 DIAGNOSIS — I4891 Unspecified atrial fibrillation: Secondary | ICD-10-CM

## 2014-04-28 LAB — POCT INR: INR: 2

## 2014-05-26 ENCOUNTER — Ambulatory Visit (INDEPENDENT_AMBULATORY_CARE_PROVIDER_SITE_OTHER): Payer: Medicare Other | Admitting: Pharmacist Clinician (PhC)/ Clinical Pharmacy Specialist

## 2014-05-26 ENCOUNTER — Telehealth: Payer: Self-pay | Admitting: Cardiology

## 2014-05-26 DIAGNOSIS — I4891 Unspecified atrial fibrillation: Secondary | ICD-10-CM

## 2014-05-26 DIAGNOSIS — Z7901 Long term (current) use of anticoagulants: Secondary | ICD-10-CM

## 2014-05-26 LAB — PROTIME-INR
INR: 5.95 — AB (ref <1.50)
Prothrombin Time: 53.2 seconds — ABNORMAL HIGH (ref 11.6–15.2)

## 2014-05-26 NOTE — Telephone Encounter (Signed)
PT 53.3 INR 5.95  Deferred to [facility]

## 2014-05-26 NOTE — Telephone Encounter (Signed)
Freda Munro has a critical INR to report

## 2014-06-09 ENCOUNTER — Other Ambulatory Visit: Payer: Self-pay | Admitting: Pharmacist Clinician (PhC)/ Clinical Pharmacy Specialist

## 2014-06-09 ENCOUNTER — Ambulatory Visit (INDEPENDENT_AMBULATORY_CARE_PROVIDER_SITE_OTHER): Payer: Medicare Other | Admitting: Pharmacist Clinician (PhC)/ Clinical Pharmacy Specialist

## 2014-06-09 DIAGNOSIS — I4891 Unspecified atrial fibrillation: Secondary | ICD-10-CM

## 2014-06-09 DIAGNOSIS — Z7901 Long term (current) use of anticoagulants: Secondary | ICD-10-CM

## 2014-06-09 DIAGNOSIS — C259 Malignant neoplasm of pancreas, unspecified: Secondary | ICD-10-CM

## 2014-06-09 LAB — POCT INR: INR: 2.2

## 2014-06-09 MED ORDER — WARFARIN SODIUM 5 MG PO TABS: ORAL_TABLET | ORAL | Status: DC

## 2014-07-03 ENCOUNTER — Ambulatory Visit (INDEPENDENT_AMBULATORY_CARE_PROVIDER_SITE_OTHER): Payer: Medicare Other | Admitting: Pharmacist Clinician (PhC)/ Clinical Pharmacy Specialist

## 2014-07-03 ENCOUNTER — Other Ambulatory Visit: Payer: Self-pay

## 2014-07-03 DIAGNOSIS — I482 Chronic atrial fibrillation, unspecified: Secondary | ICD-10-CM

## 2014-07-03 DIAGNOSIS — D509 Iron deficiency anemia, unspecified: Secondary | ICD-10-CM | POA: Diagnosis not present

## 2014-07-03 DIAGNOSIS — C3432 Malignant neoplasm of lower lobe, left bronchus or lung: Secondary | ICD-10-CM | POA: Diagnosis not present

## 2014-07-03 LAB — POCT INR: INR: 2.2

## 2014-08-15 ENCOUNTER — Telehealth: Payer: Self-pay | Admitting: *Deleted

## 2014-08-15 ENCOUNTER — Telehealth: Payer: Self-pay | Admitting: Hematology & Oncology

## 2014-08-15 ENCOUNTER — Ambulatory Visit: Payer: Medicare Other | Admitting: Pharmacist Clinician (PhC)/ Clinical Pharmacy Specialist

## 2014-08-15 ENCOUNTER — Ambulatory Visit (INDEPENDENT_AMBULATORY_CARE_PROVIDER_SITE_OTHER): Payer: Medicare Other | Admitting: Pharmacist Clinician (PhC)/ Clinical Pharmacy Specialist

## 2014-08-15 DIAGNOSIS — Z7901 Long term (current) use of anticoagulants: Secondary | ICD-10-CM

## 2014-08-15 DIAGNOSIS — I4891 Unspecified atrial fibrillation: Secondary | ICD-10-CM | POA: Diagnosis not present

## 2014-08-15 LAB — POCT INR: INR: 2.2

## 2014-08-15 NOTE — Telephone Encounter (Signed)
Patient wanting to see Dr Marin Olp for a follow up appointment. He hasn't been seen in the office since 10/2013. He is also asking about a chest x-ray. Per Dr Antonieta Pert notes, he was to have a CXR and follow up appointment 3 months after his last one. Transferred patient to scheduler to make his follow up appointment and instructed him to have his CXR right before appointment - order was already present in the computer. He is in understanding.

## 2014-08-15 NOTE — Telephone Encounter (Signed)
Home phone just rang. Left mess on cell phone regarding scheduling an appt.

## 2014-09-03 ENCOUNTER — Telehealth: Payer: Self-pay | Admitting: Hematology & Oncology

## 2014-09-03 ENCOUNTER — Ambulatory Visit (HOSPITAL_BASED_OUTPATIENT_CLINIC_OR_DEPARTMENT_OTHER)
Admission: RE | Admit: 2014-09-03 | Discharge: 2014-09-03 | Disposition: A | Discharge status: Home / Self Care | Payer: Medicare Other | Source: Ambulatory Visit | Attending: Hematology & Oncology | Admitting: Hematology & Oncology

## 2014-09-03 DIAGNOSIS — C3432 Malignant neoplasm of lower lobe, left bronchus or lung: Secondary | ICD-10-CM

## 2014-09-03 DIAGNOSIS — I482 Chronic atrial fibrillation, unspecified: Secondary | ICD-10-CM

## 2014-09-03 DIAGNOSIS — R911 Solitary pulmonary nodule: Secondary | ICD-10-CM | POA: Diagnosis not present

## 2014-09-03 DIAGNOSIS — D509 Iron deficiency anemia, unspecified: Secondary | ICD-10-CM

## 2014-09-03 NOTE — Telephone Encounter (Signed)
Lt mess regarding change appt from 8/4 to 8/9 and mailed out a calendar.

## 2014-09-11 ENCOUNTER — Other Ambulatory Visit: Payer: Medicare Other

## 2014-09-11 ENCOUNTER — Ambulatory Visit: Payer: Medicare Other | Admitting: Hematology & Oncology

## 2014-09-16 ENCOUNTER — Encounter: Payer: Self-pay | Admitting: *Deleted

## 2014-09-16 ENCOUNTER — Other Ambulatory Visit (HOSPITAL_BASED_OUTPATIENT_CLINIC_OR_DEPARTMENT_OTHER): Payer: Medicare Other

## 2014-09-16 ENCOUNTER — Ambulatory Visit (HOSPITAL_BASED_OUTPATIENT_CLINIC_OR_DEPARTMENT_OTHER): Payer: Medicare Other | Admitting: Hematology & Oncology

## 2014-09-16 VITALS — BP 147/64 | HR 57 | Temp 98.0°F | Resp 20 | Ht 68.0 in | Wt 144.8 lb

## 2014-09-16 DIAGNOSIS — I482 Chronic atrial fibrillation, unspecified: Secondary | ICD-10-CM

## 2014-09-16 DIAGNOSIS — C241 Malignant neoplasm of ampulla of Vater: Secondary | ICD-10-CM

## 2014-09-16 DIAGNOSIS — Z85118 Personal history of other malignant neoplasm of bronchus and lung: Secondary | ICD-10-CM

## 2014-09-16 DIAGNOSIS — Z8507 Personal history of malignant neoplasm of pancreas: Secondary | ICD-10-CM

## 2014-09-16 DIAGNOSIS — E119 Type 2 diabetes mellitus without complications: Secondary | ICD-10-CM | POA: Diagnosis not present

## 2014-09-16 DIAGNOSIS — D509 Iron deficiency anemia, unspecified: Secondary | ICD-10-CM

## 2014-09-16 DIAGNOSIS — C3432 Malignant neoplasm of lower lobe, left bronchus or lung: Secondary | ICD-10-CM

## 2014-09-16 LAB — CMP (CANCER CENTER ONLY)
ALBUMIN: 3.7 g/dL (ref 3.3–5.5)
ALK PHOS: 54 U/L (ref 26–84)
ALT(SGPT): 19 U/L (ref 10–47)
AST: 22 U/L (ref 11–38)
BILIRUBIN TOTAL: 0.7 mg/dL (ref 0.20–1.60)
BUN, Bld: 20 mg/dL (ref 7–22)
CHLORIDE: 99 meq/L (ref 98–108)
CO2: 30 mEq/L (ref 18–33)
Calcium: 9.5 mg/dL (ref 8.0–10.3)
Creat: 1.2 mg/dl (ref 0.6–1.2)
GLUCOSE: 208 mg/dL — AB (ref 73–118)
Potassium: 3.9 mEq/L (ref 3.3–4.7)
Sodium: 138 mEq/L (ref 128–145)
Total Protein: 7 g/dL (ref 6.4–8.1)

## 2014-09-16 LAB — CBC WITH DIFFERENTIAL (CANCER CENTER ONLY)
BASO#: 0.1 10*3/uL (ref 0.0–0.2)
BASO%: 0.8 % (ref 0.0–2.0)
EOS%: 6.3 % (ref 0.0–7.0)
Eosinophils Absolute: 0.4 10*3/uL (ref 0.0–0.5)
HEMATOCRIT: 37 % — AB (ref 38.7–49.9)
HGB: 12.1 g/dL — ABNORMAL LOW (ref 13.0–17.1)
LYMPH#: 1.2 10*3/uL (ref 0.9–3.3)
LYMPH%: 19.4 % (ref 14.0–48.0)
MCH: 30.9 pg (ref 28.0–33.4)
MCHC: 32.7 g/dL (ref 32.0–35.9)
MCV: 94 fL (ref 82–98)
MONO#: 0.8 10*3/uL (ref 0.1–0.9)
MONO%: 12.8 % (ref 0.0–13.0)
NEUT#: 3.9 10*3/uL (ref 1.5–6.5)
NEUT%: 60.7 % (ref 40.0–80.0)
Platelets: 117 10*3/uL — ABNORMAL LOW (ref 145–400)
RBC: 3.92 10*6/uL — ABNORMAL LOW (ref 4.20–5.70)
RDW: 13.1 % (ref 11.1–15.7)
WBC: 6.4 10*3/uL (ref 4.0–10.0)

## 2014-09-16 LAB — IRON AND TIBC CHCC
%SAT: 24 % (ref 20–55)
Iron: 63 ug/dL (ref 42–163)
TIBC: 259 ug/dL (ref 202–409)
UIBC: 196 ug/dL (ref 117–376)

## 2014-09-16 LAB — FERRITIN CHCC: FERRITIN: 66 ng/mL (ref 22–316)

## 2014-09-16 NOTE — Progress Notes (Signed)
Hematology and Oncology Follow Up Visit  [name] [id number] Dec 22, 1933 81 y.o. 09/16/2014   Principle Diagnosis:  Stage Ia adenocarcinoma of the left lung-resected. 2. 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 3. Recurrent iron-deficiency anemia. 4. Hypotestosteronemia.  Current Therapy:   IV iron as indicated.     Interim History:  [name] is back for followup. Unfortunately, his wife is back in the hospital. I just saw her last week. About 3 or 4 days after I saw her, she had an episode of manic depression. She is on lithium. She is not taking her medications as she should. She's been hospitalized probably 10 times this year.  He is doing okay. He's lost some weight. His blood sugars are not but all that good.  He had a chest x-ray last week. This looked okay. He has some stable nodular densities in the right upper and left upper lobes. There were no changes noted.  He is not having any issues going to the bathroom.  He's had no rashes. Medications:  Current outpatient prescriptions:  .  ACCU-CHEK AVIVA PLUS test strip, , Disp: , Rfl: 0 .  atorvastatin (LIPITOR) 10 MG tablet, 10 mg daily. Pt states he takes 3 or 4 times a week., Disp: , Rfl:  .  digoxin (LANOXIN) 0.125 MG tablet, Take 125 mcg by mouth daily. , Disp: , Rfl:  .  DILT-XR 240 MG 24 hr capsule, , Disp: , Rfl: 0 .  diltiazem (CARDIZEM CD) 240 MG 24 hr capsule, Take 240 mg by mouth daily., Disp: , Rfl:  .  econazole nitrate 1 % cream, Apply as directed, Disp: , Rfl:  .  glimepiride (AMARYL) 2 MG tablet, Take 2 mg by mouth daily before breakfast., Disp: , Rfl:  .  Lancet Device MISC, every morning. , Disp: , Rfl:  .  levothyroxine (SYNTHROID, LEVOTHROID) 100 MCG tablet, Take 100 mcg by mouth daily., Disp: , Rfl:  .  metFORMIN (GLUCOPHAGE) 500 MG tablet, Take 500 mg by mouth daily with breakfast. , Disp: , Rfl:  .  predniSONE (DELTASONE) 20 MG tablet, take 2 tablets by mouth once daily for  5 days, Disp: , Rfl: 0 .  warfarin (COUMADIN) 5 MG tablet, Take 1 tablet by mouth daily or as directed by coumadin clinic, Disp: 90 tablet, Rfl: 1 .  zolpidem (AMBIEN) 10 MG tablet, Take 10 mg by mouth as needed. , Disp: , Rfl:   Allergies: No Known Allergies  Past Medical History, Surgical history, Social history, and Family History were reviewed and updated.  Review of Systems: As above  Physical Exam:  height is '5\' 8"'$  (1.727 m) and weight is 144 lb 12.8 oz (65.681 kg). His oral temperature is 98 F (36.7 C). His blood pressure is 147/64 and his pulse is 57. His respiration is 20.   Well-developed and well-nourished gentleman. He has no ocular or oral lesions. There is no adenopathy in the neck. Lungs are clear. Cardiac exam regular in rhythm. He has no murmurs. Abdomen is soft. Has well-healed laparotomy scar. He has no fluid wave. There is no palpable liver or spleen. Back exam no tenderness over the spine. Extremities shows no clubbing cyanosis or edema. Skin exam no rashes.  Lab Results  Component Value Date   WBC 6.4 09/16/2014   HGB 12.1* 09/16/2014   HCT 37.0* 09/16/2014   MCV 94 09/16/2014   PLT 117* 09/16/2014     Chemistry  Component Value Date/Time   NA 138 09/16/2014 0945   NA 137 10/23/2013 1440   K 3.9 09/16/2014 0945   K 4.2 10/23/2013 1440   CL 99 09/16/2014 0945   CL 100 10/23/2013 1440   CO2 30 09/16/2014 0945   CO2 29 10/23/2013 1440   BUN 20 09/16/2014 0945   BUN 16 10/23/2013 1440   CREATININE 1.2 09/16/2014 0945   CREATININE 1.16 10/23/2013 1440      Component Value Date/Time   CALCIUM 9.5 09/16/2014 0945   CALCIUM 9.2 10/23/2013 1440   ALKPHOS 54 09/16/2014 0945   ALKPHOS 56 10/23/2013 1440   AST 22 09/16/2014 0945   AST 22 10/23/2013 1440   ALT 19 09/16/2014 0945   ALT 22 10/23/2013 1440   BILITOT 0.70 09/16/2014 0945   BILITOT 0.6 10/23/2013 1440         Impression and Plan: [name] is 81 year old gentleman. He had a  history of stage III adenocarcinoma of the pancreas. This was probably 10 years ago. He then had a stage I lung cancer resected in January of 2010.  His chest x-ray from my point of view looks okay. The changes are stable. I just don't think that he has recurrence.  His iron studies look good. His ferritin is 66 with an iron saturation of 24%.  His blood sugar is not that good.  I think that he is having his issues because of poorly controlled diabetes. I told him that if his blood sugars do not get under better control, that he will start having issues with his kidneys.  His white count is chronically on the low side. This does not bother me other think it probably is from medications.  I really feel bad for his wife. She's back in behavioral health with her manic depression. She just will not take her medications at home and relapses every couple weeks.  I will plan to see him back in 6 months.   [name], MD 8/9/20161:37 PM

## 2014-09-17 ENCOUNTER — Other Ambulatory Visit: Payer: Self-pay | Admitting: *Deleted

## 2014-09-17 ENCOUNTER — Telehealth: Payer: Self-pay | Admitting: *Deleted

## 2014-09-17 DIAGNOSIS — C241 Malignant neoplasm of ampulla of Vater: Secondary | ICD-10-CM

## 2014-09-17 LAB — CANCER ANTIGEN 19-9: CA 19 9: 261.7 U/mL — AB (ref <35.0)

## 2014-09-17 LAB — CEA: CEA: 3.6 ng/mL (ref 0.0–5.0)

## 2014-09-17 MED ORDER — SODIUM CHLORIDE 0.9 % IV SOLN: Freq: Once | INTRAVENOUS | Status: DC

## 2014-09-17 MED ORDER — SODIUM CHLORIDE 0.9 % IV SOLN: 510.0000 mg | Freq: Once | INTRAVENOUS | Status: DC

## 2014-09-17 NOTE — Addendum Note (Signed)
Addended by: Burney Gauze R on: 09/17/2014 06:05 PM   Modules accepted: Orders

## 2014-09-17 NOTE — Telephone Encounter (Signed)
-----   Message from [name], MD sent at 09/17/2014  6:27 AM EDT ----- Call - iron is actually borderline!!  Need 1 dose of Feraheme - please set up for 1-2 weeks.  pete

## 2014-09-17 NOTE — Telephone Encounter (Signed)
Called patient to schedule iron.  Patient needs to check schedule and will call back

## 2014-09-18 ENCOUNTER — Telehealth: Payer: Self-pay | Admitting: Hematology & Oncology

## 2014-09-18 ENCOUNTER — Other Ambulatory Visit: Payer: Self-pay | Admitting: *Deleted

## 2014-09-18 ENCOUNTER — Ambulatory Visit (HOSPITAL_COMMUNITY)
Admission: RE | Admit: 2014-09-18 | Discharge: 2014-09-18 | Disposition: A | Discharge status: Home / Self Care | Payer: Medicare Other | Source: Ambulatory Visit | Attending: Hematology & Oncology | Admitting: Hematology & Oncology

## 2014-09-18 ENCOUNTER — Encounter (HOSPITAL_COMMUNITY): Payer: Self-pay

## 2014-09-18 DIAGNOSIS — I700 Atherosclerosis of aorta: Secondary | ICD-10-CM | POA: Diagnosis not present

## 2014-09-18 DIAGNOSIS — Z923 Personal history of irradiation: Secondary | ICD-10-CM | POA: Insufficient documentation

## 2014-09-18 DIAGNOSIS — N289 Disorder of kidney and ureter, unspecified: Secondary | ICD-10-CM | POA: Diagnosis not present

## 2014-09-18 DIAGNOSIS — Z8507 Personal history of malignant neoplasm of pancreas: Secondary | ICD-10-CM | POA: Diagnosis not present

## 2014-09-18 DIAGNOSIS — R978 Other abnormal tumor markers: Secondary | ICD-10-CM | POA: Insufficient documentation

## 2014-09-18 DIAGNOSIS — Z85118 Personal history of other malignant neoplasm of bronchus and lung: Secondary | ICD-10-CM | POA: Diagnosis not present

## 2014-09-18 DIAGNOSIS — D509 Iron deficiency anemia, unspecified: Secondary | ICD-10-CM | POA: Diagnosis not present

## 2014-09-18 DIAGNOSIS — I482 Chronic atrial fibrillation, unspecified: Secondary | ICD-10-CM

## 2014-09-18 DIAGNOSIS — R918 Other nonspecific abnormal finding of lung field: Secondary | ICD-10-CM | POA: Insufficient documentation

## 2014-09-18 DIAGNOSIS — C241 Malignant neoplasm of ampulla of Vater: Secondary | ICD-10-CM

## 2014-09-18 DIAGNOSIS — R634 Abnormal weight loss: Secondary | ICD-10-CM | POA: Insufficient documentation

## 2014-09-18 DIAGNOSIS — R222 Localized swelling, mass and lump, trunk: Secondary | ICD-10-CM | POA: Insufficient documentation

## 2014-09-18 MED ORDER — IOHEXOL 300 MG/ML  SOLN
50.0000 mL | Freq: Once | INTRAMUSCULAR | Status: AC | PRN
  Administered 2014-09-18: 50 mL via ORAL

## 2014-09-18 MED ORDER — IOHEXOL 300 MG/ML  SOLN
100.0000 mL | Freq: Once | INTRAMUSCULAR | Status: AC | PRN
  Administered 2014-09-18: 100 mL via INTRAVENOUS

## 2014-09-18 NOTE — Telephone Encounter (Signed)
Spoke with pt regarding CT today. Pt confirmed appt and time. Md is holding iron infusion at this time.

## 2014-09-18 NOTE — Telephone Encounter (Signed)
LT MESS ON CELL PHONE REGARDING CT AT [facility] LONG AT 11:30AM

## 2014-09-19 ENCOUNTER — Telehealth: Payer: Self-pay | Admitting: Hematology & Oncology

## 2014-09-19 ENCOUNTER — Other Ambulatory Visit: Payer: Self-pay | Admitting: Hematology & Oncology

## 2014-09-19 DIAGNOSIS — C3432 Malignant neoplasm of lower lobe, left bronchus or lung: Secondary | ICD-10-CM

## 2014-09-19 NOTE — Telephone Encounter (Signed)
Lt mess on both phones regarding upcoming PET 8/23 at 10:30 also mailed out calendar

## 2014-09-26 ENCOUNTER — Ambulatory Visit (INDEPENDENT_AMBULATORY_CARE_PROVIDER_SITE_OTHER): Payer: Medicare Other | Admitting: Pharmacist Clinician (PhC)/ Clinical Pharmacy Specialist

## 2014-09-26 DIAGNOSIS — Z7901 Long term (current) use of anticoagulants: Secondary | ICD-10-CM | POA: Diagnosis not present

## 2014-09-26 DIAGNOSIS — I482 Chronic atrial fibrillation, unspecified: Secondary | ICD-10-CM

## 2014-09-26 DIAGNOSIS — I4891 Unspecified atrial fibrillation: Secondary | ICD-10-CM

## 2014-09-26 LAB — POCT INR: INR: 2.4

## 2014-09-30 ENCOUNTER — Ambulatory Visit (HOSPITAL_COMMUNITY)
Admission: RE | Admit: 2014-09-30 | Discharge: 2014-09-30 | Disposition: A | Discharge status: Home / Self Care | Payer: Medicare Other | Source: Ambulatory Visit | Attending: Hematology & Oncology | Admitting: Hematology & Oncology

## 2014-09-30 DIAGNOSIS — C3432 Malignant neoplasm of lower lobe, left bronchus or lung: Secondary | ICD-10-CM | POA: Diagnosis present

## 2014-09-30 LAB — GLUCOSE, CAPILLARY: GLUCOSE-CAPILLARY: 163 mg/dL — AB (ref 65–99)

## 2014-09-30 MED ORDER — FLUDEOXYGLUCOSE F - 18 (FDG) INJECTION
7.1000 | Freq: Once | INTRAVENOUS | Status: DC | PRN
  Administered 2014-09-30: 7.1 via INTRAVENOUS
  Filled 2014-09-30: qty 7.1

## 2014-10-08 ENCOUNTER — Encounter: Payer: Medicare Other | Admitting: Thoracic Surgery (Cardiothoracic Vascular Surgery)

## 2014-10-14 ENCOUNTER — Other Ambulatory Visit: Payer: Self-pay | Admitting: *Deleted

## 2014-10-14 ENCOUNTER — Institutional Professional Consult (permissible substitution) (INDEPENDENT_AMBULATORY_CARE_PROVIDER_SITE_OTHER): Payer: Medicare Other | Admitting: Thoracic Surgery (Cardiothoracic Vascular Surgery)

## 2014-10-14 ENCOUNTER — Encounter: Payer: Self-pay | Admitting: Thoracic Surgery (Cardiothoracic Vascular Surgery)

## 2014-10-14 VITALS — BP 156/83 | HR 56 | Resp 20 | Ht 68.0 in | Wt 144.0 lb

## 2014-10-14 DIAGNOSIS — R590 Localized enlarged lymph nodes: Secondary | ICD-10-CM

## 2014-10-14 DIAGNOSIS — C259 Malignant neoplasm of pancreas, unspecified: Secondary | ICD-10-CM

## 2014-10-14 DIAGNOSIS — Z85118 Personal history of other malignant neoplasm of bronchus and lung: Secondary | ICD-10-CM

## 2014-10-14 DIAGNOSIS — R222 Localized swelling, mass and lump, trunk: Secondary | ICD-10-CM | POA: Diagnosis not present

## 2014-10-14 DIAGNOSIS — R918 Other nonspecific abnormal finding of lung field: Secondary | ICD-10-CM

## 2014-10-14 NOTE — Progress Notes (Signed)
PCP is Irven Shelling, MD Referring Provider is [name], MD  Chief Complaint  Patient presents with  . Lung Lesion    Surgical eval, PET Scan 09/30/14, Chest CT 09/18/14    HPI: 81 year old man sent for consultation regarding possible recurrent lung cancer.  [name] is a 82 year old man with a history of tobacco abuse, diabetes, hypertension, anemia, atrial fibrillation, adenocarcinoma of the ampulla Vater status post Whipple procedure, and a stage IA adenocarcinoma of the left lower lobe treated with superior segmentectomy in 2010.  He has been followed by Dr. Burney Gauze.  Recently he had a chest x-ray which showed a questionable right lung nodule. A CT of the chest was ordered. There was no suspicious right lung nodule. There was some soft tissue density around his previous staple line and possible slight enlargement of a left hilar node. A PET CT was done which showed some mild hypermetabolic activity. Radiology report indicated this was more likely inflammatory than malignant.  [name] says he has not been feeling generally well. He has chronic atrial fibrillation for which he takes Coumadin. He's been having some dizzy spells. They recently had some bathroom renovations done and he says he is felt congestion and wheezing since then. His appetite is chronically poor and he has some other gastrointestinal issues such as loose stools due to his Whipple procedure. He's lost about 9 pounds over the past year. He's had no weight loss over the past 3 months. He bruises easily (on Coumadin). He gets short of breath when walking up inclines but says that dates back many years. He has not had any unusual headaches or visual changes. He denies hemoptysis, fevers, chills, or sweats.   Past Medical History  Diagnosis Date  . Diabetes mellitus   . Hypertension   . pancreatic ca dx'd 2004    oral chemo/xrt comp  . Lung cancer dx'd 02/2009    surg only  . Lung cancer, lower  lobe 01/27/2013  . Cancer of ampulla of Vater 01/27/2013  . Iron deficiency anemia, unspecified 01/27/2013  . H/O echocardiogram 2003,2009,2010, 2012    No past surgical history on file.  Family History  Problem Relation Age of Onset  . Emphysema Father   . Lung cancer Brother     Social History Social History  Substance Use Topics  . Smoking status: Former Smoker -- 2.00 packs/day for 7 years    Types: Cigarettes    Start date: 09/30/1950    Quit date: 04/29/1957  . Smokeless tobacco: Never Used     Comment: quit 54 years ago  . Alcohol Use: None    Current Outpatient Prescriptions  Medication Sig Dispense Refill  . atorvastatin (LIPITOR) 10 MG tablet 10 mg daily. Pt states he takes 3 or 4 times a week.    . digoxin (LANOXIN) 0.125 MG tablet Take 0.125 mg by mouth daily.    Marland Kitchen diltiazem (CARDIZEM CD) 240 MG 24 hr capsule Take 240 mg by mouth daily.    Marland Kitchen glimepiride (AMARYL) 2 MG tablet Take 2 mg by mouth daily before breakfast.    . levothyroxine (SYNTHROID, LEVOTHROID) 100 MCG tablet Take 100 mcg by mouth daily.    . metFORMIN (GLUCOPHAGE) 500 MG tablet Take 500 mg by mouth daily with breakfast.     . warfarin (COUMADIN) 5 MG tablet Take 1 tablet by mouth daily or as directed by coumadin clinic 90 tablet 1  . zolpidem (AMBIEN) 10 MG tablet Take 10 mg by  mouth as needed.      No current facility-administered medications for this visit.    No Known Allergies  Review of Systems  Constitutional: Positive for appetite change (chronic poor appetite) and unexpected weight change (has lost 9 pounds over a year, none over past 3 months). Negative for fever, chills, diaphoresis and activity change.  Respiratory: Positive for cough and wheezing.   Cardiovascular: Positive for leg swelling (Mild). Negative for chest pain.  Gastrointestinal: Positive for nausea and diarrhea.  Genitourinary: Negative.   Allergic/Immunologic: Positive for environmental allergies.  Neurological:  Positive for dizziness and light-headedness. Negative for syncope and weakness.  Hematological: Negative for adenopathy. Bruises/bleeds easily.  All other systems reviewed and are negative.   BP 156/83 mmHg  Pulse 56  Resp 20  Ht '5\' 8"'$  (1.727 m)  Wt 144 lb (65.318 kg)  BMI 21.90 kg/m2  SpO2 97% Physical Exam  Constitutional: He is oriented to person, place, and time. He appears well-developed and well-nourished. No distress.  HENT:  Head: Normocephalic and atraumatic.  Mouth/Throat: No oropharyngeal exudate.  Eyes: Conjunctivae and EOM are normal. No scleral icterus.  Neck: Neck supple. No tracheal deviation present. No thyromegaly present.  Cardiovascular: Intact distal pulses.   No murmur heard. Irregularly irregular  Pulmonary/Chest: Effort normal and breath sounds normal. No respiratory distress. He has no wheezes. He has no rales.  Well-healed scar  Abdominal: Soft. There is no tenderness.  Well-healed scar  Musculoskeletal: He exhibits edema (trace).  Lymphadenopathy:    He has no cervical adenopathy.  Neurological: He is alert and oriented to person, place, and time. No cranial nerve deficit.  No focal motor deficit  Skin: Skin is warm and dry.  Vitals reviewed.    Diagnostic Tests: CT CHEST AND ABDOMEN WITH CONTRAST  TECHNIQUE: Multidetector CT imaging of the chest and abdomen was performed following the standard protocol during bolus administration of intravenous contrast.  CONTRAST: 118m OMNIPAQUE IOHEXOL 300 MG/ML SOLN, 558mOMNIPAQUE IOHEXOL 300 MG/ML SOLN  COMPARISON: Chest radiographs 09/03/2014. Chest CT 01/25/2013 and abdominal CT 04/13/2011.  FINDINGS: CT CHEST FINDINGS  Mediastinum/Nodes: There are no enlarged mediastinal, hilar or axillary lymph nodes. The trachea and esophagus demonstrate no significant findings. The heart size is normal. There is no pericardial effusion. There is atherosclerosis of the aorta, great vessels and  coronary arteries.  Lungs/Pleura: There is no pleural effusion. Stable volume loss in the left hemithorax status post left lower lobe wedge resection. Distortion and enlargement of the left hilum are grossly stable. There is associated narrowing of the left lower lobe pulmonary artery. Lateral to the descending aorta, there is increased soft tissue density surrounding the surgical clips, measuring 2.5 x 2.0 cm on image 22. This change appears more conspicuous on the reformatted images which demonstrate this to have irregular margins. 4 mm right upper lobe nodule on image number 9 and 4 mm left upper lobe nodule on image 34 are grossly stable.  Musculoskeletal/Chest wall: There is no chest wall mass or suspicious osseous finding. Stable sclerotic lesion in the T5 vertebral body, likely a bone island.  CT ABDOMEN FINDINGS  Hepatobiliary: The liver is normal in density without focal abnormality. Stable postsurgical changes from previous Whipple procedure. No biliary dilatation or pneumobilia.  Pancreas: Stable atrophy of the pancreatic body and tail with mild chronic ductal dilatation. No mass lesion or surrounding inflammatory change identified.  Spleen: Normal in size without focal abnormality.  Adrenals/Urinary Tract: Both adrenal glands appear normal. Large septated cystic lesion  involving the lower pole of the left kidney is similar to prior studies, measuring up to 9 cm in diameter. The anterior septations are thin and unchanged. 13 mm complex cystic lesion in the mid right kidney is associated with small calcifications anteriorly, but is not significantly changed in size from 2013. No evidence of enhancing renal mass or hydronephrosis.  Stomach/Bowel: No evidence of bowel wall thickening, distention or surrounding inflammatory change.  Vascular/Lymphatic: There are no enlarged abdominal lymph nodes. Small retroperitoneal lymph nodes are stable. Stable  aortoiliac atherosclerosis.  Other: No evidence of ascites or peritoneal nodularity.  Musculoskeletal: No acute or significant osseous findings. Stable lumbar spine degenerative changes.  IMPRESSION: 1. Enlarging soft tissue mass posterior to the left hilum and lateral to the descending aorta. Although potentially related to radiation fibrosis, this is moderately worrisome for local recurrence of lung cancer. Consider PET-CT for further evaluation. 2. Otherwise stable postoperative appearance of the chest with chronic left hilar distortion and small pulmonary nodules. 3. Stable abdominal CT status post Whipple procedure. No evidence of local recurrence or metastatic disease in the abdomen. 4. Mildly complex bilateral renal cystic lesions.   Electronically Signed  By: Richardean Sale M.D.  On: 09/18/2014 15:37  NUCLEAR MEDICINE PET SKULL BASE TO THIGH  TECHNIQUE: 7.1 mCi F-18 FDG was injected intravenously. Full-ring PET imaging was performed from the skull base to thigh after the radiotracer. CT data was obtained and used for attenuation correction and anatomic localization.  FASTING BLOOD GLUCOSE: Value: 163 mg/dl  COMPARISON: CT 8/ 12/2014  FINDINGS: NECK  No hypermetabolic lymph nodes in the neck.  CHEST  Nodular thickening inferior to the LEFT hilum adjacent to the descending thoracic aorta has moderate metabolic activity (insert SUV max 4.2). Lesion measures approximately 2.5 by 2.8 cm on image 73, series 4 increased in size CT 04/13/2011. This moderate metabolic activity is more characteristic of inflammation rather than tumor recurrence. No suspicious pulmonary nodules otherwise.  ABDOMEN/PELVIS  There is no focal activity in the porta hepatis is site Whipple procedure to suggest local pancreatic carcinoma recurrence. No abnormal activity within the liver.  Adrenal glands are normal. No hypermetabolic abdominal pelvic lymph nodes. The  bilateral cystic change within the kidneys without associated metabolic activity. This  SKELETON  No focal hypermetabolic activity to suggest skeletal metastasis. Uptake within the RIGHT mid cervical spine is felt to be degenerative. Enlarged sclerotic lesion in the RIGHT sacrum without metabolic activity sclerotic lesion the mid thoracic spine without metabolic activity  IMPRESSION: 1. Nodular LEFT infrahilar thickening with moderate metabolic activity favors post treatment inflammation ; however cannot exclude a low-grade carcinoma. Consider bronchoscopy with sampling. 2. No evidence of pancreatic carcinoma recurrence follow Whipple procedure.   Electronically Signed  By: Suzy Bouchard M.D.  On: 09/30/2014 15:54  Impression: Mr. breast Irwin is a 81 year old man who had a left lower lobe superior segmentectomy for stage IA non-small cell carcinoma 6 years ago. He now has some soft tissue thickening along the staple line and a prominent left hilar node. This node is possibly a millimeter or 2 larger than it was in 2014. The soft tissue thickening is new from 2014. The question is whether this represents recurrence or scarring. On PET CT there is some mild hypermetabolism in the area, but nothing particular suspicious. I personally reviewed the CT scan and PET/CT.  I had a very long conversation with Mr. and [name]. I reviewed the CT and PET CT with them. I will very long discussion  with them regarding the significance of the findings. We discussed the differential diagnosis. We discussed options of radiographic follow-up versus biopsy. They were under the impression that we would go in and take all of this out surgically. That is not really a consideration given his age, comorbidities, and previous surgery.  I offered him the option of bronchoscopy and endobronchial ultrasound for biopsy. I discussed the relative advantages and disadvantages of that approach. They  understand the possibility of a false negative nondiagnostic result. I discussed the plan which would be to do this in the operating room under general anesthesia. No incisions would be made. This will all be done through the scope. I discussed the indications, risks, benefits, and alternatives. They understand the risk include those associated with general anesthesia as well as the risks of the procedure itself which include bleeding, pneumothorax, and failure to make a diagnosis.  After lengthy discussion he wishes to proceed with bronchoscopy and endobronchial ultrasound.  He is on Coumadin for atrial fibrillation. He will stop that after today's dose and we will proceed with the biopsy on Monday.  Plan:  Stop Coumadin after today's dose.  Bronchoscopy and endobronchial ultrasound under general anesthesia on Monday, 10/20/2014  I spent 45 minutes face-to-face with Mr. and [name] during this visit, greater than 50% was spent in counseling. George Nakayama, MD [facility] [phone]

## 2014-10-17 ENCOUNTER — Other Ambulatory Visit (HOSPITAL_COMMUNITY): Payer: Self-pay

## 2014-10-17 ENCOUNTER — Encounter (HOSPITAL_COMMUNITY)
Admission: RE | Admit: 2014-10-17 | Discharge: 2014-10-17 | Disposition: A | Discharge status: Home / Self Care | Payer: Medicare Other | Source: Ambulatory Visit | Attending: Thoracic Surgery (Cardiothoracic Vascular Surgery) | Admitting: Thoracic Surgery (Cardiothoracic Vascular Surgery)

## 2014-10-17 ENCOUNTER — Ambulatory Visit (HOSPITAL_COMMUNITY)
Admission: RE | Admit: 2014-10-17 | Discharge: 2014-10-17 | Disposition: A | Discharge status: Home / Self Care | Payer: Medicare Other | Source: Ambulatory Visit | Attending: Thoracic Surgery (Cardiothoracic Vascular Surgery) | Admitting: Thoracic Surgery (Cardiothoracic Vascular Surgery)

## 2014-10-17 VITALS — BP 157/63 | HR 57 | Temp 97.8°F | Resp 20 | Ht 68.0 in | Wt 151.5 lb

## 2014-10-17 DIAGNOSIS — Z7901 Long term (current) use of anticoagulants: Secondary | ICD-10-CM | POA: Diagnosis not present

## 2014-10-17 DIAGNOSIS — I10 Essential (primary) hypertension: Secondary | ICD-10-CM | POA: Diagnosis not present

## 2014-10-17 DIAGNOSIS — R590 Localized enlarged lymph nodes: Secondary | ICD-10-CM

## 2014-10-17 DIAGNOSIS — E119 Type 2 diabetes mellitus without complications: Secondary | ICD-10-CM | POA: Insufficient documentation

## 2014-10-17 DIAGNOSIS — Z8507 Personal history of malignant neoplasm of pancreas: Secondary | ICD-10-CM | POA: Insufficient documentation

## 2014-10-17 DIAGNOSIS — Z01818 Encounter for other preprocedural examination: Secondary | ICD-10-CM | POA: Diagnosis not present

## 2014-10-17 DIAGNOSIS — Z87891 Personal history of nicotine dependence: Secondary | ICD-10-CM | POA: Insufficient documentation

## 2014-10-17 DIAGNOSIS — C3490 Malignant neoplasm of unspecified part of unspecified bronchus or lung: Secondary | ICD-10-CM | POA: Insufficient documentation

## 2014-10-17 DIAGNOSIS — I482 Chronic atrial fibrillation: Secondary | ICD-10-CM | POA: Insufficient documentation

## 2014-10-17 DIAGNOSIS — Z01812 Encounter for preprocedural laboratory examination: Secondary | ICD-10-CM | POA: Diagnosis not present

## 2014-10-17 DIAGNOSIS — I440 Atrioventricular block, first degree: Secondary | ICD-10-CM | POA: Diagnosis not present

## 2014-10-17 DIAGNOSIS — Z79899 Other long term (current) drug therapy: Secondary | ICD-10-CM | POA: Diagnosis not present

## 2014-10-17 DIAGNOSIS — R0602 Shortness of breath: Secondary | ICD-10-CM | POA: Diagnosis not present

## 2014-10-17 LAB — SURGICAL PCR SCREEN
MRSA, PCR: NEGATIVE
Staphylococcus aureus: POSITIVE — AB

## 2014-10-17 LAB — COMPREHENSIVE METABOLIC PANEL
ALT: 20 U/L (ref 17–63)
AST: 20 U/L (ref 15–41)
Albumin: 3.7 g/dL (ref 3.5–5.0)
Alkaline Phosphatase: 53 U/L (ref 38–126)
Anion gap: 4 — ABNORMAL LOW (ref 5–15)
BILIRUBIN TOTAL: 0.7 mg/dL (ref 0.3–1.2)
BUN: 17 mg/dL (ref 6–20)
CO2: 30 mmol/L (ref 22–32)
Calcium: 9.2 mg/dL (ref 8.9–10.3)
Chloride: 102 mmol/L (ref 101–111)
Creatinine, Ser: 1.05 mg/dL (ref 0.61–1.24)
Glucose, Bld: 253 mg/dL — ABNORMAL HIGH (ref 65–99)
Potassium: 4 mmol/L (ref 3.5–5.1)
Sodium: 136 mmol/L (ref 135–145)
Total Protein: 6.4 g/dL — ABNORMAL LOW (ref 6.5–8.1)

## 2014-10-17 LAB — APTT: aPTT: 33 seconds (ref 24–37)

## 2014-10-17 LAB — PROTIME-INR
INR: 1.28 (ref 0.00–1.49)
Prothrombin Time: 16.2 seconds — ABNORMAL HIGH (ref 11.6–15.2)

## 2014-10-17 LAB — CBC
HEMATOCRIT: 37.4 % — AB (ref 39.0–52.0)
HEMOGLOBIN: 12.1 g/dL — AB (ref 13.0–17.0)
MCH: 30.5 pg (ref 26.0–34.0)
MCHC: 32.4 g/dL (ref 30.0–36.0)
MCV: 94.2 fL (ref 78.0–100.0)
Platelets: 121 10*3/uL — ABNORMAL LOW (ref 150–400)
RBC: 3.97 MIL/uL — AB (ref 4.22–5.81)
RDW: 13.7 % (ref 11.5–15.5)
WBC: 7.5 10*3/uL (ref 4.0–10.5)

## 2014-10-17 LAB — GLUCOSE, CAPILLARY: Glucose-Capillary: 251 mg/dL — ABNORMAL HIGH (ref 65–99)

## 2014-10-17 NOTE — Progress Notes (Signed)
Mupirocin script called into [facility] [phone]

## 2014-10-17 NOTE — Progress Notes (Signed)
Anesthesia Chart Review:   Pt is 81 year old male scheduled for video bronchoscopy with endobronchial ultrasound on 10/20/2014 with Dr. Roxan Hockey.   PMH includes: chronic atrial fibrillation, HTN, DM, iron deficiency anemia, lung cancer, pancreatic cancer. Former smoker. BMI 23.   Medications include: lipitor, digoxin, diltiazem, glimepiride, levothyroxine, metformin, coumadin.  Pt to hold coumadin 5 days prior to surgery.   Preoperative labs reviewed.  Glucose 253. HgbA1c pending. PT 16.2.   Chest x-ray 10/17/2014 pending.   EKG 10/17/2014: Sinus bradycardia (53 bpm) with 1st degree A-V block. Cannot rule out Anterior infarct, age undetermined.  Has not seen a cardiologist since 05/2011, when he last saw Dr. Rollene Fare (notes in media tab dated 06/20/11).  By notes, last echo was 05/2010, showed an EF of greater than 25%- Dr. Rollene Fare notes pt was likely in afib or atrial flutter with stable VR during that echo, it was paroxysmal. By notes, last myoview was 2010 and was negative for ischemia.   Reviewed case with Dr. Orene Desanctis.   If no changes, I anticipate pt can proceed with surgery as scheduled.   Willeen Cass, FNP-BC [facility]/Anesthesiology Phone: [phone] 10/17/2014 2:54 PM

## 2014-10-17 NOTE — Pre-Procedure Instructions (Signed)
[name]  10/17/2014      [name], [place] [address] Phone: [phone] Fax: [phone]    Your procedure is scheduled on October 20, 2014.  Report to [facility] Admitting at 10:45 A.M.  Call this number if you have problems the morning of surgery:  [phone]   Remember:  Do not eat food or drink liquids after midnight.  Take these medicines the morning of surgery with A SIP OF WATER : digoxin (LANOXIN),diltiazem (CARDIZEM CD),  levothyroxine (SYNTHROID, LEVOTHROID)   STOP HERBAL MEDICATIONS, NSAIDS (ALEVE, ADVIL, IBUPROFEN) ONE WEEK PRIOR TO SURGERY   COUMADIN AS DIRECTED    Do not wear jewelry.  Do not wear lotions, powders, or colognes.  You may NOT wear deodorant.  Men may shave face and neck.  Do not bring valuables to the hospital.  [facility] is not responsible for any belongings or valuables.  Contacts, dentures or bridgework may not be worn into surgery.  Leave your suitcase in the car.  After surgery it may be brought to your room.  For patients admitted to the hospital, discharge time will be determined by your treatment team.  Patients discharged the day of surgery will not be allowed to drive home.   Name and phone number of your driver:    Special instructions:  "PREPARING FOR SURGERY"  Please read over the following fact sheets that you were given. Pain Booklet, Coughing and Deep Breathing and Surgical Site Infection Prevention         How to Manage Your Diabetes Before Surgery   Why is it important to control my blood sugar before and after surgery?   Improving blood sugar levels before and after surgery helps healing and can limit problems.  A way of improving blood sugar control is eating a healthy diet by:  - Eating less sugar and carbohydrates  - Increasing activity/exercise  - Talk with your doctor about reaching your  blood sugar goals  High blood sugars (greater than 180 mg/dL) can raise your risk of infections and slow down your recovery so you will need to focus on controlling your diabetes during the weeks before surgery.  Make sure that the doctor who takes care of your diabetes knows about your planned surgery including the date and location.  How do I manage my blood sugars before surgery?   Check your blood sugar at least 4 times a day, 2 days before surgery to make sure that they are not too high or low.   Check your blood sugar the morning of your surgery when you wake up and every 2               hours until you get to the Short-Stay unit.  If your blood sugar is less than 70 mg/dL, you will need to treat for low blood sugar by:  Treat a low blood sugar (less than 70 mg/dL) with 1/2 cup of clear juice (cranberry or apple), 4 glucose tablets, OR glucose gel.  Recheck blood sugar in 15 minutes after treatment (to make sure it is greater than 70 mg/dL).  If blood sugar is not greater than 70 mg/dL on re-check, call [phone] for further instructions.   Report your blood sugar to the Short-Stay nurse when you get to Short-Stay.  References:  [facility], 2007 "How to Manage your Diabetes Before and After Surgery".  What do I do about my diabetes medications?   Do not take oral diabetes medicines (pills) the morning of surgery.

## 2014-10-18 LAB — HEMOGLOBIN A1C
Hgb A1c MFr Bld: 8 % — ABNORMAL HIGH (ref 4.8–5.6)
Mean Plasma Glucose: 183 mg/dL

## 2014-10-20 ENCOUNTER — Ambulatory Visit (HOSPITAL_COMMUNITY)
Admission: RE | Admit: 2014-10-20 | Discharge: 2014-10-20 | Disposition: A | Discharge status: Home / Self Care | Payer: Medicare Other | Source: Ambulatory Visit | Attending: Thoracic Surgery (Cardiothoracic Vascular Surgery) | Admitting: Thoracic Surgery (Cardiothoracic Vascular Surgery)

## 2014-10-20 ENCOUNTER — Ambulatory Visit (HOSPITAL_COMMUNITY): Payer: Medicare Other | Admitting: Certified Registered"

## 2014-10-20 ENCOUNTER — Encounter (HOSPITAL_COMMUNITY): Payer: Self-pay

## 2014-10-20 ENCOUNTER — Encounter (HOSPITAL_COMMUNITY)
Admission: RE | Disposition: A | Discharge status: Home / Self Care | Payer: Self-pay | Source: Ambulatory Visit | Attending: Thoracic Surgery (Cardiothoracic Vascular Surgery)

## 2014-10-20 DIAGNOSIS — Z8509 Personal history of malignant neoplasm of other digestive organs: Secondary | ICD-10-CM | POA: Insufficient documentation

## 2014-10-20 DIAGNOSIS — Z7901 Long term (current) use of anticoagulants: Secondary | ICD-10-CM | POA: Diagnosis not present

## 2014-10-20 DIAGNOSIS — Z90411 Acquired partial absence of pancreas: Secondary | ICD-10-CM | POA: Insufficient documentation

## 2014-10-20 DIAGNOSIS — I482 Chronic atrial fibrillation: Secondary | ICD-10-CM | POA: Insufficient documentation

## 2014-10-20 DIAGNOSIS — R918 Other nonspecific abnormal finding of lung field: Secondary | ICD-10-CM | POA: Insufficient documentation

## 2014-10-20 DIAGNOSIS — Z87891 Personal history of nicotine dependence: Secondary | ICD-10-CM | POA: Insufficient documentation

## 2014-10-20 DIAGNOSIS — E119 Type 2 diabetes mellitus without complications: Secondary | ICD-10-CM | POA: Diagnosis not present

## 2014-10-20 DIAGNOSIS — R590 Localized enlarged lymph nodes: Secondary | ICD-10-CM | POA: Insufficient documentation

## 2014-10-20 DIAGNOSIS — I10 Essential (primary) hypertension: Secondary | ICD-10-CM | POA: Insufficient documentation

## 2014-10-20 HISTORY — PX: VIDEO BRONCHOSCOPY WITH ENDOBRONCHIAL ULTRASOUND: SHX6177

## 2014-10-20 LAB — GLUCOSE, CAPILLARY
GLUCOSE-CAPILLARY: 173 mg/dL — AB (ref 65–99)
GLUCOSE-CAPILLARY: 126 mg/dL — AB (ref 65–99)

## 2014-10-20 SURGERY — BRONCHOSCOPY, WITH EBUS
Anesthesia: General

## 2014-10-20 MED ORDER — GLYCOPYRROLATE 0.2 MG/ML IJ SOLN
INTRAMUSCULAR | Status: DC | PRN
  Administered 2014-10-20: .6 mg via INTRAVENOUS

## 2014-10-20 MED ORDER — FENTANYL CITRATE (PF) 100 MCG/2ML IJ SOLN
INTRAMUSCULAR | Status: DC | PRN
  Administered 2014-10-20: 50 ug via INTRAVENOUS
  Administered 2014-10-20: 100 ug via INTRAVENOUS
  Administered 2014-10-20: 50 ug via INTRAVENOUS

## 2014-10-20 MED ORDER — ROCURONIUM BROMIDE 100 MG/10ML IV SOLN
INTRAVENOUS | Status: DC | PRN
  Administered 2014-10-20: 20 mg via INTRAVENOUS
  Administered 2014-10-20: 30 mg via INTRAVENOUS

## 2014-10-20 MED ORDER — PHENYLEPHRINE HCL 10 MG/ML IJ SOLN
10.0000 mg | INTRAVENOUS | Status: DC | PRN
  Administered 2014-10-20: 15 ug/min via INTRAVENOUS

## 2014-10-20 MED ORDER — PROPOFOL 10 MG/ML IV BOLUS
INTRAVENOUS | Status: AC
  Filled 2014-10-20: qty 20

## 2014-10-20 MED ORDER — ONDANSETRON HCL 4 MG/2ML IJ SOLN
INTRAMUSCULAR | Status: DC | PRN
  Administered 2014-10-20: 4 mg via INTRAVENOUS

## 2014-10-20 MED ORDER — EPINEPHRINE HCL 1 MG/ML IJ SOLN
INTRAMUSCULAR | Status: DC | PRN
  Administered 2014-10-20: 1 mg

## 2014-10-20 MED ORDER — PHENYLEPHRINE 40 MCG/ML (10ML) SYRINGE FOR IV PUSH (FOR BLOOD PRESSURE SUPPORT)
PREFILLED_SYRINGE | INTRAVENOUS | Status: AC
  Filled 2014-10-20: qty 10

## 2014-10-20 MED ORDER — PROPOFOL 10 MG/ML IV BOLUS
INTRAVENOUS | Status: DC | PRN
  Administered 2014-10-20: 150 mg via INTRAVENOUS

## 2014-10-20 MED ORDER — ROCURONIUM BROMIDE 50 MG/5ML IV SOLN
INTRAVENOUS | Status: AC
  Filled 2014-10-20: qty 1

## 2014-10-20 MED ORDER — FENTANYL CITRATE (PF) 250 MCG/5ML IJ SOLN
INTRAMUSCULAR | Status: AC
  Filled 2014-10-20: qty 5

## 2014-10-20 MED ORDER — PHENYLEPHRINE HCL 10 MG/ML IJ SOLN
INTRAMUSCULAR | Status: DC | PRN
  Administered 2014-10-20: 120 ug via INTRAVENOUS
  Administered 2014-10-20: 40 ug via INTRAVENOUS
  Administered 2014-10-20: 80 ug via INTRAVENOUS
  Administered 2014-10-20: 40 ug via INTRAVENOUS

## 2014-10-20 MED ORDER — LACTATED RINGERS IV SOLN
INTRAVENOUS | Status: DC
Start: 2014-10-20 — End: 2014-10-20
  Administered 2014-10-20 (×2): via INTRAVENOUS

## 2014-10-20 MED ORDER — NEOSTIGMINE METHYLSULFATE 10 MG/10ML IV SOLN
INTRAVENOUS | Status: DC | PRN
  Administered 2014-10-20: 3 mg via INTRAVENOUS

## 2014-10-20 MED ORDER — EPINEPHRINE HCL 1 MG/ML IJ SOLN
INTRAMUSCULAR | Status: AC
  Filled 2014-10-20: qty 1

## 2014-10-20 SURGICAL SUPPLY — 38 items
BALL CTTN LRG ABS STRL LF (GAUZE/BANDAGES/DRESSINGS)
BRUSH CYTOL CELLEBRITY 1.5X140 (MISCELLANEOUS) IMPLANT
BRUSH CYTOL CELLEBRITY 1.9X150 (MISCELLANEOUS) ×2 IMPLANT
CANISTER SUCTION 2500CC (MISCELLANEOUS) ×3 IMPLANT
CONT SPEC 4OZ CLIKSEAL STRL BL (MISCELLANEOUS) ×3 IMPLANT
COTTONBALL LRG STERILE PKG (GAUZE/BANDAGES/DRESSINGS) IMPLANT
COVER TABLE BACK 60X90 (DRAPES) ×3 IMPLANT
FILTER STRAW FLUID ASPIR (MISCELLANEOUS) IMPLANT
FORCEPS BIOP RJ4 1.8 (CUTTING FORCEPS) IMPLANT
FORCEPS RADIAL JAW LRG 4 PULM (INSTRUMENTS) IMPLANT
GAUZE SPONGE 4X4 12PLY STRL (GAUZE/BANDAGES/DRESSINGS) IMPLANT
GLOVE SURG SIGNA 7.5 PF LTX (GLOVE) ×5 IMPLANT
GOWN STRL REUS W/ TWL LRG LVL3 (GOWN DISPOSABLE) IMPLANT
GOWN STRL REUS W/ TWL XL LVL3 (GOWN DISPOSABLE) ×1 IMPLANT
GOWN STRL REUS W/TWL LRG LVL3 (GOWN DISPOSABLE) ×3
GOWN STRL REUS W/TWL XL LVL3 (GOWN DISPOSABLE) ×3
KIT CLEAN ENDO COMPLIANCE (KITS) ×3 IMPLANT
KIT ROOM TURNOVER OR (KITS) ×3 IMPLANT
MARKER SKIN DUAL TIP RULER LAB (MISCELLANEOUS) ×3 IMPLANT
NDL BIOPSY TRANSBRONCH 21G (NEEDLE) IMPLANT
NDL BLUNT 18X1 FOR OR ONLY (NEEDLE) IMPLANT
NEEDLE 22X1 1/2 (OR ONLY) (NEEDLE) IMPLANT
NEEDLE BIOPSY TRANSBRONCH 21G (NEEDLE) IMPLANT
NEEDLE BLUNT 18X1 FOR OR ONLY (NEEDLE) IMPLANT
NEEDLE SONO TIP II EBUS (NEEDLE) ×5 IMPLANT
NS IRRIG 1000ML POUR BTL (IV SOLUTION) ×3 IMPLANT
OIL SILICONE PENTAX (PARTS (SERVICE/REPAIRS)) IMPLANT
PAD ARMBOARD 7.5X6 YLW CONV (MISCELLANEOUS) ×6 IMPLANT
RADIAL JAW LRG 4 PULMONARY (INSTRUMENTS) ×2
SYR 20CC LL (SYRINGE) ×3 IMPLANT
SYR 20ML ECCENTRIC (SYRINGE) ×3 IMPLANT
SYR 5ML LL (SYRINGE) ×3 IMPLANT
SYR 5ML LUER SLIP (SYRINGE) ×3 IMPLANT
SYR CONTROL 10ML LL (SYRINGE) IMPLANT
TOWEL OR 17X24 6PK STRL BLUE (TOWEL DISPOSABLE) ×3 IMPLANT
TRAP SPECIMEN MUCOUS 40CC (MISCELLANEOUS) ×5 IMPLANT
TUBE CONNECTING 20'X1/4 (TUBING) ×1
TUBE CONNECTING 20X1/4 (TUBING) ×2 IMPLANT

## 2014-10-20 NOTE — H&P (View-Only) (Signed)
PCP is George Shelling, MD Referring Provider is [name], MD  Chief Complaint  Patient presents with  . Lung Lesion    Surgical eval, PET Scan 09/30/14, Chest CT 09/18/14    HPI: 81 year old man sent for consultation regarding possible recurrent lung cancer.  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  He has been followed by Dr. Burney Gauze.  Recently he had a chest x-ray which showed a questionable right lung nodule. A CT of the chest was ordered. There was no suspicious right lung nodule. There was some soft tissue density around his previous staple line and possible slight enlargement of a left hilar node. A PET CT was done which showed some mild hypermetabolic activity. Radiology report indicated this was more likely inflammatory than malignant.  Mr. Wells Guiles says he has not been feeling generally well. He has chronic atrial fibrillation for which he takes Coumadin. He's been having some dizzy spells. They recently had some bathroom renovations done and he says he is felt congestion and wheezing since then. His appetite is chronically poor and he has some other gastrointestinal issues such as loose stools due to his Whipple procedure. He's lost about 9 pounds over the past year. He's had no weight loss over the past 3 months. He bruises easily (on Coumadin). He gets short of breath when walking up inclines but says that dates back many years. He has not had any unusual headaches or visual changes. He denies hemoptysis, fevers, chills, or sweats.   Past Medical History  Diagnosis Date  . Diabetes mellitus   . Hypertension   . pancreatic ca dx'd 2004    oral chemo/xrt comp  . Lung cancer dx'd 02/2009    surg only  . Lung cancer, lower  lobe 01/27/2013  . Cancer of ampulla of Vater 01/27/2013  . Iron deficiency anemia, unspecified 01/27/2013  . H/O echocardiogram 2003,2009,2010, 2012    No past surgical history on file.  Family History  Problem Relation Age of Onset  . Emphysema Father   . Lung cancer Brother     Social History Social History  Substance Use Topics  . Smoking status: Former Smoker -- 2.00 packs/day for 7 years    Types: Cigarettes    Start date: 09/30/1950    Quit date: 04/29/1957  . Smokeless tobacco: Never Used     Comment: quit 54 years ago  . Alcohol Use: None    Current Outpatient Prescriptions  Medication Sig Dispense Refill  . atorvastatin (LIPITOR) 10 MG tablet 10 mg daily. Pt states he takes 3 or 4 times a week.    . digoxin (LANOXIN) 0.125 MG tablet Take 0.125 mg by mouth daily.    Marland Kitchen diltiazem (CARDIZEM CD) 240 MG 24 hr capsule Take 240 mg by mouth daily.    Marland Kitchen glimepiride (AMARYL) 2 MG tablet Take 2 mg by mouth daily before breakfast.    . levothyroxine (SYNTHROID, LEVOTHROID) 100 MCG tablet Take 100 mcg by mouth daily.    . metFORMIN (GLUCOPHAGE) 500 MG tablet Take 500 mg by mouth daily with breakfast.     . warfarin (COUMADIN) 5 MG tablet Take 1 tablet by mouth daily or as directed by coumadin clinic 90 tablet 1  . zolpidem (AMBIEN) 10 MG tablet Take 10 mg by  mouth as needed.      No current facility-administered medications for this visit.    No Known Allergies  Review of Systems  Constitutional: Positive for appetite change (chronic poor appetite) and unexpected weight change (has lost 9 pounds over a year, none over past 3 months). Negative for fever, chills, diaphoresis and activity change.  Respiratory: Positive for cough and wheezing.   Cardiovascular: Positive for leg swelling (Mild). Negative for chest pain.  Gastrointestinal: Positive for nausea and diarrhea.  Genitourinary: Negative.   Allergic/Immunologic: Positive for environmental allergies.  Neurological:  Positive for dizziness and light-headedness. Negative for syncope and weakness.  Hematological: Negative for adenopathy. Bruises/bleeds easily.  All other systems reviewed and are negative.   BP 156/83 mmHg  Pulse 56  Resp 20  Ht '5\' 8"'$  (1.727 m)  Wt 144 lb (65.318 kg)  BMI 21.90 kg/m2  SpO2 97% Physical Exam  Constitutional: He is oriented to person, place, and time. He appears well-developed and well-nourished. No distress.  HENT:  Head: Normocephalic and atraumatic.  Mouth/Throat: No oropharyngeal exudate.  Eyes: Conjunctivae and EOM are normal. No scleral icterus.  Neck: Neck supple. No tracheal deviation present. No thyromegaly present.  Cardiovascular: Intact distal pulses.   No murmur heard. Irregularly irregular  Pulmonary/Chest: Effort normal and breath sounds normal. No respiratory distress. He has no wheezes. He has no rales.  Well-healed scar  Abdominal: Soft. There is no tenderness.  Well-healed scar  Musculoskeletal: He exhibits edema (trace).  Lymphadenopathy:    He has no cervical adenopathy.  Neurological: He is alert and oriented to person, place, and time. No cranial nerve deficit.  No focal motor deficit  Skin: Skin is warm and dry.  Vitals reviewed.    Diagnostic Tests: CT CHEST AND ABDOMEN WITH CONTRAST  TECHNIQUE: Multidetector CT imaging of the chest and abdomen was performed following the standard protocol during bolus administration of intravenous contrast.  CONTRAST: 159m OMNIPAQUE IOHEXOL 300 MG/ML SOLN, 540mOMNIPAQUE IOHEXOL 300 MG/ML SOLN  COMPARISON: Chest radiographs 09/03/2014. Chest CT 01/25/2013 and abdominal CT 04/13/2011.  FINDINGS: CT CHEST FINDINGS  Mediastinum/Nodes: There are no enlarged mediastinal, hilar or axillary lymph nodes. The trachea and esophagus demonstrate no significant findings. The heart size is normal. There is no pericardial effusion. There is atherosclerosis of the aorta, great vessels and  coronary arteries.  Lungs/Pleura: There is no pleural effusion. Stable volume loss in the left hemithorax status post left lower lobe wedge resection. Distortion and enlargement of the left hilum are grossly stable. There is associated narrowing of the left lower lobe pulmonary artery. Lateral to the descending aorta, there is increased soft tissue density surrounding the surgical clips, measuring 2.5 x 2.0 cm on image 22. This change appears more conspicuous on the reformatted images which demonstrate this to have irregular margins. 4 mm right upper lobe nodule on image number 9 and 4 mm left upper lobe nodule on image 34 are grossly stable.  Musculoskeletal/Chest wall: There is no chest wall mass or suspicious osseous finding. Stable sclerotic lesion in the T5 vertebral body, likely a bone island.  CT ABDOMEN FINDINGS  Hepatobiliary: The liver is normal in density without focal abnormality. Stable postsurgical changes from previous Whipple procedure. No biliary dilatation or pneumobilia.  Pancreas: Stable atrophy of the pancreatic body and tail with mild chronic ductal dilatation. No mass lesion or surrounding inflammatory change identified.  Spleen: Normal in size without focal abnormality.  Adrenals/Urinary Tract: Both adrenal glands appear normal. Large septated cystic lesion  involving the lower pole of the left kidney is similar to prior studies, measuring up to 9 cm in diameter. The anterior septations are thin and unchanged. 13 mm complex cystic lesion in the mid right kidney is associated with small calcifications anteriorly, but is not significantly changed in size from 2013. No evidence of enhancing renal mass or hydronephrosis.  Stomach/Bowel: No evidence of bowel wall thickening, distention or surrounding inflammatory change.  Vascular/Lymphatic: There are no enlarged abdominal lymph nodes. Small retroperitoneal lymph nodes are stable. Stable  aortoiliac atherosclerosis.  Other: No evidence of ascites or peritoneal nodularity.  Musculoskeletal: No acute or significant osseous findings. Stable lumbar spine degenerative changes.  IMPRESSION: 1. Enlarging soft tissue mass posterior to the left hilum and lateral to the descending aorta. Although potentially related to radiation fibrosis, this is moderately worrisome for local recurrence of lung cancer. Consider PET-CT for further evaluation. 2. Otherwise stable postoperative appearance of the chest with chronic left hilar distortion and small pulmonary nodules. 3. Stable abdominal CT status post Whipple procedure. No evidence of local recurrence or metastatic disease in the abdomen. 4. Mildly complex bilateral renal cystic lesions.   Electronically Signed  By: Richardean Sale M.D.  On: 09/18/2014 15:37  NUCLEAR MEDICINE PET SKULL BASE TO THIGH  TECHNIQUE: 7.1 mCi F-18 FDG was injected intravenously. Full-ring PET imaging was performed from the skull base to thigh after the radiotracer. CT data was obtained and used for attenuation correction and anatomic localization.  FASTING BLOOD GLUCOSE: Value: 163 mg/dl  COMPARISON: CT 8/ 12/2014  FINDINGS: NECK  No hypermetabolic lymph nodes in the neck.  CHEST  Nodular thickening inferior to the LEFT hilum adjacent to the descending thoracic aorta has moderate metabolic activity (insert SUV max 4.2). Lesion measures approximately 2.5 by 2.8 cm on image 73, series 4 increased in size CT 04/13/2011. This moderate metabolic activity is more characteristic of inflammation rather than tumor recurrence. No suspicious pulmonary nodules otherwise.  ABDOMEN/PELVIS  There is no focal activity in the porta hepatis is site Whipple procedure to suggest local pancreatic carcinoma recurrence. No abnormal activity within the liver.  Adrenal glands are normal. No hypermetabolic abdominal pelvic lymph nodes. The  bilateral cystic change within the kidneys without associated metabolic activity. This  SKELETON  No focal hypermetabolic activity to suggest skeletal metastasis. Uptake within the RIGHT mid cervical spine is felt to be degenerative. Enlarged sclerotic lesion in the RIGHT sacrum without metabolic activity sclerotic lesion the mid thoracic spine without metabolic activity  IMPRESSION: 1. Nodular LEFT infrahilar thickening with moderate metabolic activity favors post treatment inflammation ; however cannot exclude a low-grade carcinoma. Consider bronchoscopy with sampling. 2. No evidence of pancreatic carcinoma recurrence follow Whipple procedure.   Electronically Signed  By: Suzy Bouchard M.D.  On: 09/30/2014 15:54  Impression: Mr. breast off is a 81 year old man who had a left lower lobe superior segmentectomy for stage IA non-small cell carcinoma 6 years ago. He now has some soft tissue thickening along the staple line and a prominent left hilar node. This node is possibly a millimeter or 2 larger than it was in 2014. The soft tissue thickening is new from 2014. The question is whether this represents recurrence or scarring. On PET CT there is some mild hypermetabolism in the area, but nothing particular suspicious. I personally reviewed the CT scan and PET/CT.  I had a very long conversation with Mr. and Mrs. Bochicchio. I reviewed the CT and PET CT with them. I will very long discussion  with them regarding the significance of the findings. We discussed the differential diagnosis. We discussed options of radiographic follow-up versus biopsy. They were under the impression that we would go in and take all of this out surgically. That is not really a consideration given his age, comorbidities, and previous surgery.  I offered him the option of bronchoscopy and endobronchial ultrasound for biopsy. I discussed the relative advantages and disadvantages of that approach. They  understand the possibility of a false negative nondiagnostic result. I discussed the plan which would be to do this in the operating room under general anesthesia. No incisions would be made. This will all be done through the scope. I discussed the indications, risks, benefits, and alternatives. They understand the risk include those associated with general anesthesia as well as the risks of the procedure itself which include bleeding, pneumothorax, and failure to make a diagnosis.  After lengthy discussion he wishes to proceed with bronchoscopy and endobronchial ultrasound.  He is on Coumadin for atrial fibrillation. He will stop that after today's dose and we will proceed with the biopsy on Monday.  Plan:  Stop Coumadin after today's dose.  Bronchoscopy and endobronchial ultrasound under general anesthesia on Monday, 10/20/2014  I spent 45 minutes face-to-face with Mr. and Mrs. Slattery off during this visit, greater than 50% was spent in counseling. Melrose Nakayama, MD [facility] [phone]

## 2014-10-20 NOTE — Anesthesia Procedure Notes (Signed)
Procedure Name: Intubation Date/Time: 10/20/2014 1:10 PM Performed by: Clearnce Sorrel Pre-anesthesia Checklist: Patient identified, Emergency Drugs available, Suction available, Timeout performed and Patient being monitored Patient Re-evaluated:Patient Re-evaluated prior to inductionOxygen Delivery Method: Circle system utilized Preoxygenation: Pre-oxygenation with 100% oxygen Intubation Type: IV induction Ventilation: Two handed mask ventilation required and Oral airway inserted - appropriate to patient size Laryngoscope Size: Mac and 4 Grade View: Grade I Tube type: Oral Tube size: 8.5 mm Number of attempts: 1 Placement Confirmation: ETT inserted through vocal cords under direct vision,  positive ETCO2 and breath sounds checked- equal and bilateral Secured at: 23 cm Tube secured with: Tape Dental Injury: Teeth and Oropharynx as per pre-operative assessment

## 2014-10-20 NOTE — Brief Op Note (Signed)
10/20/2014  3:40 PM  PATIENT:  [name]  81 y.o. male  PRE-OPERATIVE DIAGNOSIS:  LEFT HILAR ADENOPATHY/ LEFT LUNG MASS  POST-OPERATIVE DIAGNOSIS:  LEFT HILAR ADENOPATHY/ LEFT LUNG MASS  PROCEDURE:  Procedure(s): VIDEO BRONCHOSCOPY WITH ENDOBRONCHIAL ULTRASOUND (N/A)  SURGEON:  Surgeon(s) and Role:    * Melrose Nakayama, MD - Primary    ANESTHESIA:   general  EBL:  Total I/O In: 1000 [I.V.:1000] Out: -   BLOOD ADMINISTERED:none  DRAINS: none   LOCAL MEDICATIONS USED:  NONE  SPECIMEN:  Source of Specimen:  Level 11L, 7 lymph nodes, LLL bronchus  DISPOSITION OF SPECIMEN:  PATHOLOGY and micro  PLAN OF CARE: Discharge to home after PACU  PATIENT DISPOSITION:  PACU - hemodynamically stable.   Delay start of Pharmacological VTE agent (>24hrs) due to surgical blood loss or risk of bleeding: not applicable  Initial stains negative for cancer

## 2014-10-20 NOTE — Anesthesia Preprocedure Evaluation (Addendum)
Anesthesia Evaluation  Patient identified by MRN, date of birth, ID band Patient awake    Reviewed: Allergy & Precautions, NPO status , Patient's Chart, lab work & pertinent test results  History of Anesthesia Complications Negative for: history of anesthetic complications  Airway Mallampati: I   Neck ROM: Full    Dental   Pulmonary former smoker,  Hx of lung ca   breath sounds clear to auscultation       Cardiovascular hypertension,  Rhythm:Regular Rate:Normal     Neuro/Psych    GI/Hepatic Hx of pancreatic ca   Endo/Other  diabetes  Renal/GU      Musculoskeletal   Abdominal   Peds  Hematology  (+) anemia ,   Anesthesia Other Findings   Reproductive/Obstetrics                            Anesthesia Physical Anesthesia Plan  ASA: III  Anesthesia Plan: General   Post-op Pain Management:    Induction: Intravenous  Airway Management Planned: Oral ETT  Additional Equipment:   Intra-op Plan:   Post-operative Plan: Extubation in OR  Informed Consent: I have reviewed the patients History and Physical, chart, labs and discussed the procedure including the risks, benefits and alternatives for the proposed anesthesia with the patient or authorized representative who has indicated his/her understanding and acceptance.   Dental advisory given  Plan Discussed with: CRNA, Surgeon and Anesthesiologist  Anesthesia Plan Comments:        Anesthesia Quick Evaluation

## 2014-10-20 NOTE — Anesthesia Postprocedure Evaluation (Signed)
  Anesthesia Post-op Note  Patient: [name]  Procedure(s) Performed: Procedure(s): VIDEO BRONCHOSCOPY WITH ENDOBRONCHIAL ULTRASOUND (N/A)  Patient Location: PACU  Anesthesia Type:General  Level of Consciousness: awake and alert   Airway and Oxygen Therapy: Patient Spontanous Breathing  Post-op Pain: mild  Post-op Assessment: Post-op Vital signs reviewed and Patient's Cardiovascular Status Stable              Post-op Vital Signs: stable  Last Vitals:  Filed Vitals:   10/20/14 1630  BP: 152/78  Pulse: 75  Temp:   Resp: 19    Complications: No apparent anesthesia complications

## 2014-10-20 NOTE — Progress Notes (Signed)
Spoke with Dr. Orene Desanctis, reported pt. BP since arrival to [facility]

## 2014-10-20 NOTE — Transfer of Care (Signed)
Immediate Anesthesia Transfer of Care Note  Patient: [name]  Procedure(s) Performed: Procedure(s): VIDEO BRONCHOSCOPY WITH ENDOBRONCHIAL ULTRASOUND (N/A)  Patient Location: PACU  Anesthesia Type:General  Level of Consciousness: awake, alert  and oriented  Airway & Oxygen Therapy: Patient Spontanous Breathing and Patient connected to nasal cannula oxygen  Post-op Assessment: Report given to RN and Post -op Vital signs reviewed and stable  Post vital signs: Reviewed and stable  Last Vitals:  Filed Vitals:   10/20/14 1530  BP: 160/82  Pulse: 91  Temp: 36.8 C  Resp: 16    Complications: No apparent anesthesia complications

## 2014-10-20 NOTE — Discharge Instructions (Addendum)
Do not drive or engage in heavy physical activity for 24 hours  You may resume normal activities tomorrow  You may cough up small amounts of blood over the next few days- this is normal.  You may use over the counter cough medication if needed  Resume coumadin tomorrow  Call [phone] if you develop chest pain, shortness of breath, fever > 101 F, or cough up more than 2 tablespoons of blood  My office will contact you with a follow up appointment for next week to discuss the biopsy results.

## 2014-10-20 NOTE — Interval H&P Note (Signed)
History and Physical Interval Note:  10/20/2014 10:46 AM  [name]  has presented today for surgery, with the diagnosis of LEFT HILAR ADENOPATHY LEFT LUNG MASS  The various methods of treatment have been discussed with the patient and family. After consideration of risks, benefits and other options for treatment, the patient has consented to  Procedure(s): [place] (N/A) as a surgical intervention .  The patient's history has been reviewed, patient examined, no change in status, stable for surgery.  I have reviewed the patient's chart and labs.  Questions were answered to the patient's satisfaction.     [name]

## 2014-10-21 ENCOUNTER — Encounter (HOSPITAL_COMMUNITY): Payer: Self-pay | Admitting: Thoracic Surgery (Cardiothoracic Vascular Surgery)

## 2014-10-21 NOTE — OR Nursing (Signed)
Late entry for delay code documentation. 

## 2014-10-21 NOTE — Op Note (Signed)
NAMEHARVARD, ZEISS              ACCOUNT NO.:  [id number]  MEDICAL RECORD NO.:  [id number]  LOCATION:  [facility]                         FACILITY:  [facility]  PHYSICIAN:  Revonda Standard. Roxan Hockey, M.D.DATE OF BIRTH:  07/20/1933  DATE OF PROCEDURE:  10/20/2014 DATE OF DISCHARGE:  10/20/2014                              OPERATIVE REPORT   PREOPERATIVE DIAGNOSIS:  Left hilar adenopathy and left lung mass.  POSTOPERATIVE DIAGNOSIS:  Left hilar adenopathy and left lung mass.  PROCEDURE:   Video bronchoscopy with brushings, biopsies, and washings. Endobronchial ultrasound with hilar and mediastinal lymph node aspirations.  SURGEON:  Revonda Standard. Roxan Hockey, M.D.  ANESTHESIA:  General.  FINDINGS:  Friable mucosa. EBUS assisted node aspirations of 11L and 7 lymph nodes- no tumor seen.  Moderate bleeding with brushings and biopsies, no tumor seen on initial brushings.  CLINICAL NOTE:  Mr. 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  He recently had a chest x-ray, which showed a question of a right lung nodule.  CT of the chest showed no suspicious findings in the right lung, but there was some increased soft tissue density around his previous staple line and possibly slight enlargement of a left hilar node in comparison to his previous CT.  A PET CT was done which showed mild hypermetabolic activity which was felt to possibly be inflammatory rather than malignant; however, Mr. Olano was very concerned about the possibility of recurrent cancer and wished to have diagnostic procedure done.  We discussed the possibility of proceeding with bronchoscopy and endobronchial ultrasound under general anesthesia for diagnostic purposes.  The indications, risks, benefits, and alternatives were discussed in detail with the patient.  He accepted the risks, including the possibility of a false negative  biopsy, and wished to proceed.  OPERATIVE NOTE:  Mr. Deman was brought to the operating room on October 20, 2014.  He had induction of general anesthesia.  Flexible fiberoptic bronchoscopy was performed via the endotracheal tube.  The trachea and right bronchial tree appeared normal.  On the left, the left main stem was normal.  There was edema in the mucosa at the left carina. There was some narrowing of the left lower lobe bronchus, which appeared chronic in nature.  No mass lesions were seen.  The tissue was very friable and bled easily, even with just the initial bronchoscopic inspection before any samples were taken.  The bronchoscope was removed.  The endobronchial ultrasound probe then was advanced.  The left hilar, level 11 L node, at the bifurcation of the upper and lower lobe bronchi was identified with ultrasound. Multiple aspirations were performed.  A portion of each specimens was placed on slides for immediate preparation and the remainder was placed into cytologic fluid for permanent cytology.  With each aspiration, the needle was advanced into the node with ultrasound visualization and 12 passes were made with the needle with suction applied.  After performing 3 aspirations from the level 11 node, the scope was brought back to the main carina.  A level 7 lymph node was identified and 2 additional aspirations were performed of  this node. The endobronchial ultrasound probe was removed.  The bronchoscope was reinserted.  There had been some bleeding from the aspirations.  Dilute epinephrine was applied through the scope to help with hemostasis and visualization.  Brushings were obtained from the left lower lobe bronchus.  These were placed on slides and the specimens that had been obtained to this point were taken to pathology for evaluation. While awaiting those results, additional brushings were performed. Biopsies also were performed of the left carina and the  proximal left lower lobe bronchus and also biopsies were done in the region of the superior segmental bronchus.  All the biopsies were sent for permanent pathology only.  Additional brushings were obtained and those also were sent for permanent staining.  Again, there was moderate bleeding with sampling but this did improve with irrigation with dilute epinephrine and saline. After adequate biopsy sampling had been performed, the scope was removed.  The patient was extubated in the operating room and taken to the postanesthetic care unit in good condition.  The initial preparations showed no tumor seen in any of the specimens.     Revonda Standard Roxan Hockey, M.D.     SCH/MEDQ  D:  10/20/2014  T:  10/21/2014  Job:  [id number]

## 2014-10-22 ENCOUNTER — Other Ambulatory Visit: Payer: Self-pay | Admitting: Hematology & Oncology

## 2014-10-22 DIAGNOSIS — C3431 Malignant neoplasm of lower lobe, right bronchus or lung: Secondary | ICD-10-CM

## 2014-10-22 DIAGNOSIS — C241 Malignant neoplasm of ampulla of Vater: Secondary | ICD-10-CM

## 2014-10-23 ENCOUNTER — Telehealth: Payer: Self-pay | Admitting: Hematology & Oncology

## 2014-10-23 LAB — CULTURE, RESPIRATORY: CULTURE: NO GROWTH

## 2014-10-23 LAB — CULTURE, RESPIRATORY W GRAM STAIN

## 2014-10-23 NOTE — Telephone Encounter (Signed)
Left message on voicemail for patient re lab/ct/fu 12/24/14. Per 9/14 pof lab/ct/fu same day.

## 2014-11-05 ENCOUNTER — Encounter: Payer: Self-pay | Admitting: Cardiology

## 2014-11-07 ENCOUNTER — Ambulatory Visit (INDEPENDENT_AMBULATORY_CARE_PROVIDER_SITE_OTHER): Payer: Medicare Other | Admitting: Pharmacist Clinician (PhC)/ Clinical Pharmacy Specialist

## 2014-11-07 DIAGNOSIS — Z7901 Long term (current) use of anticoagulants: Secondary | ICD-10-CM

## 2014-11-07 DIAGNOSIS — I4891 Unspecified atrial fibrillation: Secondary | ICD-10-CM | POA: Diagnosis not present

## 2014-11-07 LAB — POCT INR: INR: 3

## 2014-11-16 LAB — FUNGUS CULTURE W SMEAR: FUNGAL SMEAR: NONE SEEN

## 2014-12-02 LAB — AFB CULTURE WITH SMEAR (NOT AT ARMC): Acid Fast Smear: NONE SEEN

## 2014-12-19 ENCOUNTER — Ambulatory Visit: Payer: Medicare Other | Admitting: Pharmacist Clinician (PhC)/ Clinical Pharmacy Specialist

## 2014-12-24 ENCOUNTER — Encounter: Payer: Self-pay | Admitting: Hematology & Oncology

## 2014-12-24 ENCOUNTER — Ambulatory Visit (HOSPITAL_BASED_OUTPATIENT_CLINIC_OR_DEPARTMENT_OTHER)
Admission: RE | Admit: 2014-12-24 | Discharge: 2014-12-24 | Disposition: A | Discharge status: Home / Self Care | Payer: Medicare Other | Source: Ambulatory Visit | Attending: Hematology & Oncology | Admitting: Hematology & Oncology

## 2014-12-24 ENCOUNTER — Other Ambulatory Visit (HOSPITAL_BASED_OUTPATIENT_CLINIC_OR_DEPARTMENT_OTHER): Payer: Medicare Other

## 2014-12-24 ENCOUNTER — Ambulatory Visit (HOSPITAL_BASED_OUTPATIENT_CLINIC_OR_DEPARTMENT_OTHER): Payer: Medicare Other | Admitting: Hematology & Oncology

## 2014-12-24 ENCOUNTER — Encounter (HOSPITAL_BASED_OUTPATIENT_CLINIC_OR_DEPARTMENT_OTHER): Payer: Self-pay

## 2014-12-24 VITALS — BP 159/85 | HR 68 | Temp 97.6°F | Wt 153.0 lb

## 2014-12-24 DIAGNOSIS — Z8507 Personal history of malignant neoplasm of pancreas: Secondary | ICD-10-CM

## 2014-12-24 DIAGNOSIS — C241 Malignant neoplasm of ampulla of Vater: Secondary | ICD-10-CM | POA: Diagnosis not present

## 2014-12-24 DIAGNOSIS — D509 Iron deficiency anemia, unspecified: Secondary | ICD-10-CM | POA: Diagnosis not present

## 2014-12-24 DIAGNOSIS — Z85118 Personal history of other malignant neoplasm of bronchus and lung: Secondary | ICD-10-CM | POA: Diagnosis not present

## 2014-12-24 DIAGNOSIS — C3431 Malignant neoplasm of lower lobe, right bronchus or lung: Secondary | ICD-10-CM

## 2014-12-24 DIAGNOSIS — M899 Disorder of bone, unspecified: Secondary | ICD-10-CM | POA: Insufficient documentation

## 2014-12-24 DIAGNOSIS — C3490 Malignant neoplasm of unspecified part of unspecified bronchus or lung: Secondary | ICD-10-CM | POA: Diagnosis not present

## 2014-12-24 DIAGNOSIS — E291 Testicular hypofunction: Secondary | ICD-10-CM | POA: Diagnosis not present

## 2014-12-24 DIAGNOSIS — N281 Cyst of kidney, acquired: Secondary | ICD-10-CM | POA: Insufficient documentation

## 2014-12-24 DIAGNOSIS — R911 Solitary pulmonary nodule: Secondary | ICD-10-CM | POA: Diagnosis not present

## 2014-12-24 LAB — IRON AND TIBC CHCC
%SAT: 19 % — ABNORMAL LOW (ref 20–55)
IRON: 48 ug/dL (ref 42–163)
TIBC: 251 ug/dL (ref 202–409)
UIBC: 203 ug/dL (ref 117–376)

## 2014-12-24 LAB — CMP (CANCER CENTER ONLY)
ALT(SGPT): 16 U/L (ref 10–47)
AST: 18 U/L (ref 11–38)
Albumin: 3.5 g/dL (ref 3.3–5.5)
Alkaline Phosphatase: 56 U/L (ref 26–84)
BUN: 13 mg/dL (ref 7–22)
CHLORIDE: 100 meq/L (ref 98–108)
CO2: 31 mEq/L (ref 18–33)
Calcium: 9.6 mg/dL (ref 8.0–10.3)
Creat: 1.2 mg/dl (ref 0.6–1.2)
GLUCOSE: 157 mg/dL — AB (ref 73–118)
Potassium: 4 mEq/L (ref 3.3–4.7)
SODIUM: 144 meq/L (ref 128–145)
TOTAL PROTEIN: 7.4 g/dL (ref 6.4–8.1)
Total Bilirubin: 0.7 mg/dl (ref 0.20–1.60)

## 2014-12-24 LAB — CBC WITH DIFFERENTIAL (CANCER CENTER ONLY)
BASO#: 0.1 10*3/uL (ref 0.0–0.2)
BASO%: 1 % (ref 0.0–2.0)
EOS ABS: 0.7 10*3/uL — AB (ref 0.0–0.5)
EOS%: 10.5 % — ABNORMAL HIGH (ref 0.0–7.0)
HCT: 37.4 % — ABNORMAL LOW (ref 38.7–49.9)
HEMOGLOBIN: 12 g/dL — AB (ref 13.0–17.1)
LYMPH#: 1.1 10*3/uL (ref 0.9–3.3)
LYMPH%: 17.7 % (ref 14.0–48.0)
MCH: 30.3 pg (ref 28.0–33.4)
MCHC: 32.1 g/dL (ref 32.0–35.9)
MCV: 94 fL (ref 82–98)
MONO#: 0.8 10*3/uL (ref 0.1–0.9)
MONO%: 12.4 % (ref 0.0–13.0)
NEUT#: 3.6 10*3/uL (ref 1.5–6.5)
NEUT%: 58.4 % (ref 40.0–80.0)
PLATELETS: 143 10*3/uL — AB (ref 145–400)
RBC: 3.96 10*6/uL — ABNORMAL LOW (ref 4.20–5.70)
RDW: 13.7 % (ref 11.1–15.7)
WBC: 6.2 10*3/uL (ref 4.0–10.0)

## 2014-12-24 LAB — FERRITIN CHCC: FERRITIN: 68 ng/mL (ref 22–316)

## 2014-12-24 MED ORDER — IOHEXOL 300 MG/ML  SOLN
100.0000 mL | Freq: Once | INTRAMUSCULAR | Status: AC | PRN
  Administered 2014-12-24: 100 mL via INTRAVENOUS

## 2014-12-24 NOTE — Progress Notes (Signed)
Hematology and Oncology Follow Up Visit  [name] [id number] Aug 30, 1933 80 y.o. 12/24/2014   Principle Diagnosis:  Stage Ia adenocarcinoma of the left lung-resected. 2. 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 3. Recurrent iron-deficiency anemia. 4. Hypotestosteronemia.  Current Therapy:   IV iron as indicated.     Interim History:  [name] is back for followup. He looks quite good. He feels good. He's had no cough or shortness of breath.  He is still working.  Thankfully, his wife, who suffers from manic depression, has not been hospitalized for a couple months.  We did go ahead and get a CT scan on him today. The CT of the abdomen and pelvis did not show anything that looked suspicious. The CT scan of the chest showed an enlarged left infrahilar lymph node measuring 1.3 cm. A left infrahilar lesion measures 2.8 x 3 cm. This is slightly increased from before. There is a new 7 mm left upper lobe lesion. There is a new area of nodularity in the left lung base.  Again, I'm not sure exactly what all this signifies. He has had a previous biopsy which was negative for any obvious malignancy.  We will have to see what his tumor markers show.  He is on Coumadin. He is on metformin for diabetes.  Overall, his performance status is ECOG 1. . Medications:  Current outpatient prescriptions:  .  atorvastatin (LIPITOR) 10 MG tablet, Take 10 mg by mouth 3 (three) times a week. , Disp: , Rfl:  .  digoxin (LANOXIN) 0.125 MG tablet, Take 0.125 mg by mouth daily., Disp: , Rfl:  .  diltiazem (CARDIZEM CD) 240 MG 24 hr capsule, Take 180 mg by mouth daily. , Disp: , Rfl:  .  glimepiride (AMARYL) 2 MG tablet, Take 2 mg by mouth daily before breakfast., Disp: , Rfl:  .  levothyroxine (SYNTHROID, LEVOTHROID) 100 MCG tablet, Take 100 mcg by mouth daily., Disp: , Rfl:  .  metFORMIN (GLUCOPHAGE) 500 MG tablet, Take 500 mg by mouth 2 (two) times daily with a meal. , Disp:  , Rfl:  .  warfarin (COUMADIN) 5 MG tablet, Take 1 tablet by mouth daily or as directed by coumadin clinic, Disp: 90 tablet, Rfl: 1 .  zolpidem (AMBIEN) 10 MG tablet, Take 5 mg by mouth as needed. , Disp: , Rfl:   Allergies:  Allergies  Allergen Reactions  . Promethazine Other (See Comments)    Makes hyper    Past Medical History, Surgical history, Social history, and Family History were reviewed and updated.  Review of Systems: As above  Physical Exam:  weight is 153 lb (69.4 kg). His oral temperature is 97.6 F (36.4 C). His blood pressure is 159/85 and his pulse is 68.   Well-developed and well-nourished gentleman. He has no ocular or oral lesions. There is no adenopathy in the neck. Lungs are clear. Cardiac exam regular in rhythm. He has no murmurs. Abdomen is soft. Has well-healed laparotomy scar. He has no fluid wave. There is no palpable liver or spleen. Back exam no tenderness over the spine. Extremities shows no clubbing cyanosis or edema. Skin exam no rashes.  Lab Results  Component Value Date   WBC 6.2 12/24/2014   HGB 12.0* 12/24/2014   HCT 37.4* 12/24/2014   MCV 94 12/24/2014   PLT 143* 12/24/2014     Chemistry      Component Value Date/Time   NA 144 12/24/2014 7628  NA 136 10/17/2014 1242   K 4.0 12/24/2014 0821   K 4.0 10/17/2014 1242   CL 100 12/24/2014 0821   CL 102 10/17/2014 1242   CO2 31 12/24/2014 0821   CO2 30 10/17/2014 1242   BUN 13 12/24/2014 0821   BUN 17 10/17/2014 1242   CREATININE 1.2 12/24/2014 0821   CREATININE 1.05 10/17/2014 1242      Component Value Date/Time   CALCIUM 9.6 12/24/2014 0821   CALCIUM 9.2 10/17/2014 1242   ALKPHOS 56 12/24/2014 0821   ALKPHOS 53 10/17/2014 1242   AST 18 12/24/2014 0821   AST 20 10/17/2014 1242   ALT 16 12/24/2014 0821   ALT 20 10/17/2014 1242   BILITOT 0.70 12/24/2014 0821   BILITOT 0.7 10/17/2014 1242         Impression and Plan: [name] is 81 year old gentleman. He had a history of  stage III adenocarcinoma of the pancreas. This was probably 10 years ago. He then had a stage I lung cancer resected in January of 2010.  I am really not sure what to make of the CT scan interpretation. It does seem to be a little bit ambiguous.  He is totally asymptomatic. I know that we have done scans on him in the past. And everything has come back unremarkable.  We will still have to be very cautious. I think we probably will have to get him back in 3 months and reevaluate him with another scan.  I think that his iron studies are okay. at home and relapses every couple weeks.  Back in August, his CA 19-9 was incredibly elevated. It was 262. I am rechecking that. I think that this is further elevated, then we may have to consider getting him back to surgery for an invasive biopsy.  George Napoleon, MD 11/16/20166:05 PM

## 2014-12-25 LAB — RETICULOCYTES (CHCC)
ABS RETIC: 47.8 10*3/uL (ref 19.0–186.0)
RBC.: 3.98 MIL/uL — ABNORMAL LOW (ref 4.22–5.81)
Retic Ct Pct: 1.2 % (ref 0.4–2.3)

## 2014-12-25 LAB — CANCER ANTIGEN 19-9: CA 19-9: 180.4 U/mL — ABNORMAL HIGH (ref <35.0)

## 2014-12-26 ENCOUNTER — Ambulatory Visit (INDEPENDENT_AMBULATORY_CARE_PROVIDER_SITE_OTHER): Payer: Medicare Other | Admitting: Pharmacist Clinician (PhC)/ Clinical Pharmacy Specialist

## 2014-12-26 DIAGNOSIS — Z7901 Long term (current) use of anticoagulants: Secondary | ICD-10-CM | POA: Diagnosis not present

## 2014-12-26 DIAGNOSIS — I482 Chronic atrial fibrillation, unspecified: Secondary | ICD-10-CM

## 2014-12-26 DIAGNOSIS — I4891 Unspecified atrial fibrillation: Secondary | ICD-10-CM | POA: Diagnosis not present

## 2014-12-26 LAB — POCT INR: INR: 3.2

## 2014-12-29 ENCOUNTER — Other Ambulatory Visit: Payer: Self-pay | Admitting: Cardiology

## 2015-01-07 ENCOUNTER — Ambulatory Visit (HOSPITAL_COMMUNITY)
Admission: RE | Admit: 2015-01-07 | Discharge: 2015-01-07 | Disposition: A | Discharge status: Home / Self Care | Payer: Medicare Other | Source: Ambulatory Visit | Attending: Hematology & Oncology | Admitting: Hematology & Oncology

## 2015-01-07 DIAGNOSIS — C241 Malignant neoplasm of ampulla of Vater: Secondary | ICD-10-CM | POA: Insufficient documentation

## 2015-01-07 DIAGNOSIS — Z0189 Encounter for other specified special examinations: Secondary | ICD-10-CM | POA: Insufficient documentation

## 2015-01-07 DIAGNOSIS — C3431 Malignant neoplasm of lower lobe, right bronchus or lung: Secondary | ICD-10-CM | POA: Diagnosis not present

## 2015-01-07 DIAGNOSIS — I2510 Atherosclerotic heart disease of native coronary artery without angina pectoris: Secondary | ICD-10-CM | POA: Insufficient documentation

## 2015-01-07 DIAGNOSIS — N200 Calculus of kidney: Secondary | ICD-10-CM | POA: Diagnosis not present

## 2015-01-07 DIAGNOSIS — D509 Iron deficiency anemia, unspecified: Secondary | ICD-10-CM

## 2015-01-07 DIAGNOSIS — Z9889 Other specified postprocedural states: Secondary | ICD-10-CM | POA: Diagnosis not present

## 2015-01-07 LAB — GLUCOSE, CAPILLARY: GLUCOSE-CAPILLARY: 135 mg/dL — AB (ref 65–99)

## 2015-01-07 IMAGING — PT NM PET TUM IMG RESTAG (PS) SKULL BASE T - THIGH
1 of 2 series · 1 of 25 positions shown · non-contrast
Comparison: CT chest abdomen pelvis 12/24/2014 and PET 09/30/2014.

CLINICAL DATA: Subsequent treatment strategy for lung cancer.

EXAM:
NUCLEAR MEDICINE PET SKULL BASE TO THIGH
TECHNIQUE: 8 point to mCi F-18 FDG was injected intravenously. Full-ring PET
imaging was performed from the skull base to thigh after the
radiotracer. CT data was obtained and used for attenuation
correction and anatomic localization.
FASTING BLOOD GLUCOSE:  Value: 135 mg/dl

[Series 605: range-ct sk_thigh 5.0 (id)<alpha range> · 1 of 56 slices shown]
[im 29/56]
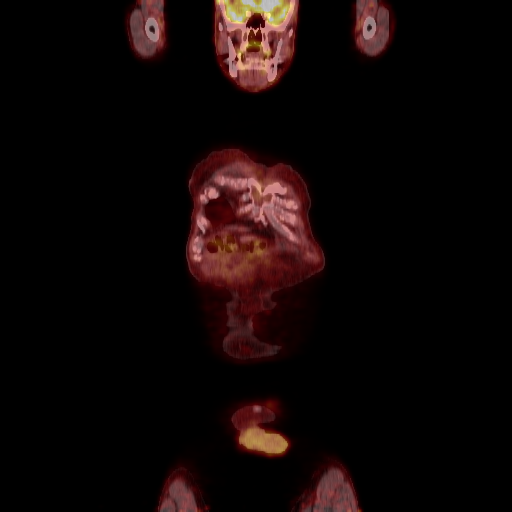

[1 of 25 positions shown; findings below may reference images not displayed]

FINDINGS: NECK

No hypermetabolic lymph nodes in the neck. CT images show no acute
findings.

CHEST

Postsurgical and post treatment changes are seen in the left
perihilar region and left lower lobe, with mild hypermetabolism, SUV
max 4.3, unchanged from 09/30/2014. No hypermetabolic mediastinal,
hilar or axillary lymph nodes. No hypermetabolic pulmonary nodules.
CT images show coronary artery calcification. Heart is enlarged. No
pericardial or pleural effusion.

ABDOMEN/PELVIS

No abnormal hypermetabolism in the liver, adrenal glands, spleen or
pancreas. No hypermetabolic lymph nodes. CT images show the liver to
be grossly unremarkable. Cholecystectomy. Adrenal glands are
unremarkable. Right kidney is non rotated and contains a tiny stone.
Low-attenuation lesions in the kidneys measure up to 8.8 cm on the
left with areas of calcified septation, as before. Spleen
unremarkable. Postoperative changes are seen in the pancreas and
stomach. Bowel is grossly unremarkable. No free fluid.

SKELETON

No abnormal osseous hypermetabolism. Probable scattered benign bone
islands in the spine, left rib and sacrum.
IMPRESSION: 1. Postsurgical and posttreatment changes in the left hemi thorax
with stable mild hypermetabolism. Findings are unchanged from
09/30/2014. No evidence of recurrent or metastatic disease.
2. Coronary artery calcification.
3. Tiny right renal stone.

## 2015-01-07 MED ORDER — FLUDEOXYGLUCOSE F - 18 (FDG) INJECTION
8.1700 | Freq: Once | INTRAVENOUS | Status: AC | PRN
Start: 2015-01-07 — End: 2015-01-07
  Administered 2015-01-07: 8.17 via INTRAVENOUS

## 2015-01-23 ENCOUNTER — Ambulatory Visit (INDEPENDENT_AMBULATORY_CARE_PROVIDER_SITE_OTHER): Payer: Medicare Other | Admitting: Pharmacist Clinician (PhC)/ Clinical Pharmacy Specialist

## 2015-01-23 DIAGNOSIS — I482 Chronic atrial fibrillation, unspecified: Secondary | ICD-10-CM

## 2015-01-23 DIAGNOSIS — I4891 Unspecified atrial fibrillation: Secondary | ICD-10-CM

## 2015-01-23 DIAGNOSIS — Z7901 Long term (current) use of anticoagulants: Secondary | ICD-10-CM

## 2015-01-23 LAB — POCT INR: INR: 2.5

## 2015-02-27 ENCOUNTER — Ambulatory Visit (INDEPENDENT_AMBULATORY_CARE_PROVIDER_SITE_OTHER): Payer: Medicare Other | Admitting: Pharmacist Clinician (PhC)/ Clinical Pharmacy Specialist

## 2015-02-27 DIAGNOSIS — I482 Chronic atrial fibrillation, unspecified: Secondary | ICD-10-CM

## 2015-02-27 DIAGNOSIS — I4891 Unspecified atrial fibrillation: Secondary | ICD-10-CM | POA: Diagnosis not present

## 2015-02-27 DIAGNOSIS — Z7901 Long term (current) use of anticoagulants: Secondary | ICD-10-CM | POA: Diagnosis not present

## 2015-02-27 LAB — POCT INR: INR: 3.2

## 2015-03-17 ENCOUNTER — Ambulatory Visit: Payer: Medicare Other | Admitting: Hematology & Oncology

## 2015-03-17 ENCOUNTER — Other Ambulatory Visit: Payer: Medicare Other

## 2015-03-27 ENCOUNTER — Ambulatory Visit (INDEPENDENT_AMBULATORY_CARE_PROVIDER_SITE_OTHER): Payer: Medicare Other | Admitting: Pharmacist Clinician (PhC)/ Clinical Pharmacy Specialist

## 2015-03-27 DIAGNOSIS — I4891 Unspecified atrial fibrillation: Secondary | ICD-10-CM

## 2015-03-27 DIAGNOSIS — Z7901 Long term (current) use of anticoagulants: Secondary | ICD-10-CM

## 2015-03-27 DIAGNOSIS — I482 Chronic atrial fibrillation, unspecified: Secondary | ICD-10-CM

## 2015-03-27 LAB — POCT INR: INR: 2.8

## 2015-04-01 ENCOUNTER — Other Ambulatory Visit (HOSPITAL_BASED_OUTPATIENT_CLINIC_OR_DEPARTMENT_OTHER): Payer: Medicare Other

## 2015-04-01 ENCOUNTER — Encounter: Payer: Self-pay | Admitting: Hematology & Oncology

## 2015-04-01 ENCOUNTER — Telehealth: Payer: Self-pay | Admitting: *Deleted

## 2015-04-01 ENCOUNTER — Ambulatory Visit (HOSPITAL_BASED_OUTPATIENT_CLINIC_OR_DEPARTMENT_OTHER): Payer: Medicare Other | Admitting: Hematology & Oncology

## 2015-04-01 VITALS — BP 158/92 | HR 82 | Temp 98.1°F | Resp 18 | Wt 149.0 lb

## 2015-04-01 DIAGNOSIS — Z85118 Personal history of other malignant neoplasm of bronchus and lung: Secondary | ICD-10-CM | POA: Diagnosis not present

## 2015-04-01 DIAGNOSIS — Z8509 Personal history of malignant neoplasm of other digestive organs: Secondary | ICD-10-CM

## 2015-04-01 DIAGNOSIS — C241 Malignant neoplasm of ampulla of Vater: Secondary | ICD-10-CM

## 2015-04-01 DIAGNOSIS — C3431 Malignant neoplasm of lower lobe, right bronchus or lung: Secondary | ICD-10-CM

## 2015-04-01 DIAGNOSIS — R634 Abnormal weight loss: Secondary | ICD-10-CM

## 2015-04-01 DIAGNOSIS — D509 Iron deficiency anemia, unspecified: Secondary | ICD-10-CM

## 2015-04-01 DIAGNOSIS — E119 Type 2 diabetes mellitus without complications: Secondary | ICD-10-CM | POA: Diagnosis not present

## 2015-04-01 DIAGNOSIS — K8681 Exocrine pancreatic insufficiency: Secondary | ICD-10-CM

## 2015-04-01 LAB — COMPREHENSIVE METABOLIC PANEL
ALT: 14 U/L (ref 0–55)
ANION GAP: 10 meq/L (ref 3–11)
AST: 14 U/L (ref 5–34)
Albumin: 3.8 g/dL (ref 3.5–5.0)
Alkaline Phosphatase: 60 U/L (ref 40–150)
BUN: 17.5 mg/dL (ref 7.0–26.0)
CHLORIDE: 102 meq/L (ref 98–109)
CO2: 26 mEq/L (ref 22–29)
Calcium: 9.1 mg/dL (ref 8.4–10.4)
Creatinine: 1.1 mg/dL (ref 0.7–1.3)
EGFR: 62 mL/min/{1.73_m2} — ABNORMAL LOW (ref >90)
Glucose: 218 mg/dl — ABNORMAL HIGH (ref 70–140)
POTASSIUM: 4 meq/L (ref 3.5–5.1)
Sodium: 139 mEq/L (ref 136–145)
Total Bilirubin: 0.64 mg/dL (ref 0.20–1.20)
Total Protein: 7 g/dL (ref 6.4–8.3)

## 2015-04-01 LAB — CBC WITH DIFFERENTIAL (CANCER CENTER ONLY)
BASO#: 0.1 10*3/uL (ref 0.0–0.2)
BASO%: 0.8 % (ref 0.0–2.0)
EOS%: 6.4 % (ref 0.0–7.0)
Eosinophils Absolute: 0.4 10*3/uL (ref 0.0–0.5)
HCT: 37.6 % — ABNORMAL LOW (ref 38.7–49.9)
HGB: 12.1 g/dL — ABNORMAL LOW (ref 13.0–17.1)
LYMPH#: 1 10*3/uL (ref 0.9–3.3)
LYMPH%: 16.4 % (ref 14.0–48.0)
MCH: 29.3 pg (ref 28.0–33.4)
MCHC: 32.2 g/dL (ref 32.0–35.9)
MCV: 91 fL (ref 82–98)
MONO#: 0.6 10*3/uL (ref 0.1–0.9)
MONO%: 10.4 % (ref 0.0–13.0)
NEUT#: 4.1 10*3/uL (ref 1.5–6.5)
NEUT%: 66 % (ref 40.0–80.0)
Platelets: 141 10*3/uL — ABNORMAL LOW (ref 145–400)
RBC: 4.13 10*6/uL — AB (ref 4.20–5.70)
RDW: 14.1 % (ref 11.1–15.7)
WBC: 6.1 10*3/uL (ref 4.0–10.0)

## 2015-04-01 LAB — IRON AND TIBC
%SAT: 20 % (ref 20–55)
IRON: 58 ug/dL (ref 42–163)
TIBC: 288 ug/dL (ref 202–409)
UIBC: 230 ug/dL (ref 117–376)

## 2015-04-01 LAB — FERRITIN: FERRITIN: 41 ng/mL (ref 22–316)

## 2015-04-01 MED ORDER — PANCRELIPASE (LIP-PROT-AMYL) 36000-114000 UNITS PO CPEP: 36000.0000 [IU] | ORAL_CAPSULE | Freq: Three times a day (TID) | ORAL | Status: DC

## 2015-04-01 NOTE — Telephone Encounter (Signed)
-----   Message from [name], MD sent at 04/01/2015  3:27 PM EST ----- Call - iron is ok!!! pete

## 2015-04-02 ENCOUNTER — Telehealth: Payer: Self-pay | Admitting: *Deleted

## 2015-04-02 LAB — CEA (PARALLEL TESTING): CEA: 2.9 ng/mL — AB

## 2015-04-02 LAB — CANCER ANTIGEN 19-9: CAN 19-9: 214 U/mL — AB (ref 0–35)

## 2015-04-02 LAB — CANCER ANTIGEN 19-9 (PARALLEL TESTING): CA 19-9: 226 U/mL — ABNORMAL HIGH (ref <34)

## 2015-04-02 LAB — CEA: CEA1: 4.8 ng/mL — AB (ref 0.0–4.7)

## 2015-04-02 NOTE — Progress Notes (Signed)
Hematology and Oncology Follow Up Visit  [name] [id number] [birth year] 82 y.o. 04/02/2015   Principle Diagnosis:  Stage Ia adenocarcinoma of the left lung-resected. 2. 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 3. Recurrent iron-deficiency anemia. 4. Hypotestosteronemia.  Current Therapy:   IV iron as indicated.     Interim History:  [name] is back for followup. He is losing a little weight. This is somewhat troublesome for me. I'll do this might be from his diabetes. It sounds like he may have some dumping issues. I don't think he is on any type of Creon. This might help a little bit.  ON last saw him, we repeat his scans on him. I did a PET scan which did not show anything that was obvious.  His CA-19-9 has been elevated. This is somewhat concerning.   Thankfully, his wife has not been doing too bad. She has manic depression. She has not been hospitalized for this recently.   He is still working. He is enjoying this.   He is on Coumadin. I'm not sure who manages the Coumadin.   He's had no obvious change in bowel or bladder habits.   His appetite has been doing pretty good. He is trying to Will watch what he eats.   Overall, his performance status is ECOG 1. . Medications:  Current outpatient prescriptions:  .  atorvastatin (LIPITOR) 10 MG tablet, Take 10 mg by mouth 3 (three) times a week. , Disp: , Rfl:  .  digoxin (LANOXIN) 0.125 MG tablet, Take 0.125 mg by mouth daily., Disp: , Rfl:  .  diltiazem (CARDIZEM CD) 240 MG 24 hr capsule, Take 180 mg by mouth daily. , Disp: , Rfl:  .  glimepiride (AMARYL) 2 MG tablet, Take 2 mg by mouth daily before breakfast., Disp: , Rfl:  .  levothyroxine (SYNTHROID, LEVOTHROID) 100 MCG tablet, Take 100 mcg by mouth daily., Disp: , Rfl:  .  lipase/protease/amylase (CREON) 36000 UNITS CPEP capsule, Take 1 capsule (36,000 Units total) by mouth 3 (three) times daily with meals., Disp: 180 capsule, Rfl: 3 .   metFORMIN (GLUCOPHAGE) 500 MG tablet, Take 500 mg by mouth 2 (two) times daily with a meal. , Disp: , Rfl:  .  warfarin (COUMADIN) 5 MG tablet, take 1 tablet by mouth daily or as directed BY COUMADIN CLINIC, Disp: 90 tablet, Rfl: 1 .  zolpidem (AMBIEN) 10 MG tablet, Take 5 mg by mouth as needed. , Disp: , Rfl:   Allergies:  Allergies  Allergen Reactions  . Promethazine Other (See Comments)    Makes hyper    Past Medical History, Surgical history, Social history, and Family History were reviewed and updated.  Review of Systems: As above  Physical Exam:  weight is 149 lb (67.586 kg). His oral temperature is 98.1 F (36.7 C). His blood pressure is 158/92 and his pulse is 82. His respiration is 18.   Well-developed and well-nourished gentleman. He has no ocular or oral lesions. There is no adenopathy in the neck. Lungs are clear. Cardiac exam regular rate and rhythm. He has no murmurs, rubs or bruits. Abdomen is soft. He has a well-healed laparotomy scar. He has no fluid wave. There is no palpable liver or spleen. Back exam shows no tenderness over the spine, ribs or hips. Extremities shows no clubbing cyanosis or edema. Has good range of motion of his joints. Skin exam no rashes. Neurological exam is nonfocal.  Lab Results  Component Value  Date   WBC 6.1 04/01/2015   HGB 12.1* 04/01/2015   HCT 37.6* 04/01/2015   MCV 91 04/01/2015   PLT 141* 04/01/2015     Chemistry      Component Value Date/Time   NA 139 04/01/2015 0925   NA 144 12/24/2014 0821   NA 136 10/17/2014 1242   K 4.0 04/01/2015 0925   K 4.0 12/24/2014 0821   K 4.0 10/17/2014 1242   CL 100 12/24/2014 0821   CL 102 10/17/2014 1242   CO2 26 04/01/2015 0925   CO2 31 12/24/2014 0821   CO2 30 10/17/2014 1242   BUN 17.5 04/01/2015 0925   BUN 13 12/24/2014 0821   BUN 17 10/17/2014 1242   CREATININE 1.1 04/01/2015 0925   CREATININE 1.2 12/24/2014 0821   CREATININE 1.05 10/17/2014 1242      Component Value Date/Time    CALCIUM 9.1 04/01/2015 0925   CALCIUM 9.6 12/24/2014 0821   CALCIUM 9.2 10/17/2014 1242   ALKPHOS 60 04/01/2015 0925   ALKPHOS 56 12/24/2014 0821   ALKPHOS 53 10/17/2014 1242   AST 14 04/01/2015 0925   AST 18 12/24/2014 0821   AST 20 10/17/2014 1242   ALT 14 04/01/2015 0925   ALT 16 12/24/2014 0821   ALT 20 10/17/2014 1242   BILITOT 0.64 04/01/2015 0925   BILITOT 0.70 12/24/2014 0821   BILITOT 0.7 10/17/2014 1242         Impression and Plan: [name] is 82 year old gentleman. He had a history of stage III adenocarcinoma of the pancreas. This was probably 10 years ago. He then had a stage I lung cancer resected in January of 2010.  I am concerned about the weight loss. The tumor markers certainly are also concerning.  I think we are going to have to get another set of scans on him. I would then consider a biopsy.  I cannot imagine that his pancreatic cancer would recur after 10 years. However, the CA 19-9 would be suggestive of this. However, given that he has adenocarcinoma of the lung, this also could cause the CA 19-9 to go up.  I think we're going to have to be aggressive here in trying to identify any malignancy.  I will plan to get him back in another couple months for follow-up.  George Napoleon, MD 2/23/20175:23 PM

## 2015-04-02 NOTE — Telephone Encounter (Addendum)
Patient aware of results  ----- Message from [name], MD sent at 04/01/2015  3:27 PM EST ----- Call - iron is ok!!! [name]

## 2015-04-06 ENCOUNTER — Ambulatory Visit (HOSPITAL_BASED_OUTPATIENT_CLINIC_OR_DEPARTMENT_OTHER): Payer: Medicare Other

## 2015-04-07 ENCOUNTER — Ambulatory Visit (HOSPITAL_COMMUNITY): Payer: Medicare Other

## 2015-04-24 ENCOUNTER — Ambulatory Visit (INDEPENDENT_AMBULATORY_CARE_PROVIDER_SITE_OTHER): Payer: Medicare Other | Admitting: Pharmacist Clinician (PhC)/ Clinical Pharmacy Specialist

## 2015-04-24 DIAGNOSIS — I4891 Unspecified atrial fibrillation: Secondary | ICD-10-CM

## 2015-04-24 DIAGNOSIS — I482 Chronic atrial fibrillation, unspecified: Secondary | ICD-10-CM

## 2015-04-24 DIAGNOSIS — Z7901 Long term (current) use of anticoagulants: Secondary | ICD-10-CM | POA: Diagnosis not present

## 2015-04-24 LAB — POCT INR: INR: 3.4

## 2015-05-15 ENCOUNTER — Encounter: Payer: Medicare Other | Admitting: Pharmacist Clinician (PhC)/ Clinical Pharmacy Specialist

## 2015-05-22 ENCOUNTER — Encounter: Payer: Medicare Other | Admitting: Pharmacist Clinician (PhC)/ Clinical Pharmacy Specialist

## 2015-06-01 ENCOUNTER — Ambulatory Visit (HOSPITAL_BASED_OUTPATIENT_CLINIC_OR_DEPARTMENT_OTHER): Payer: Medicare Other | Admitting: Hematology & Oncology

## 2015-06-01 ENCOUNTER — Other Ambulatory Visit (HOSPITAL_BASED_OUTPATIENT_CLINIC_OR_DEPARTMENT_OTHER): Payer: Medicare Other

## 2015-06-01 ENCOUNTER — Encounter: Payer: Self-pay | Admitting: Hematology & Oncology

## 2015-06-01 ENCOUNTER — Ambulatory Visit (HOSPITAL_BASED_OUTPATIENT_CLINIC_OR_DEPARTMENT_OTHER)
Admission: RE | Admit: 2015-06-01 | Discharge: 2015-06-01 | Disposition: A | Discharge status: Home / Self Care | Payer: Medicare Other | Source: Ambulatory Visit | Attending: Hematology & Oncology | Admitting: Hematology & Oncology

## 2015-06-01 VITALS — BP 170/76 | HR 63 | Temp 97.4°F | Resp 18 | Ht 68.0 in | Wt 150.0 lb

## 2015-06-01 DIAGNOSIS — R062 Wheezing: Secondary | ICD-10-CM

## 2015-06-01 DIAGNOSIS — Z8507 Personal history of malignant neoplasm of pancreas: Secondary | ICD-10-CM | POA: Diagnosis not present

## 2015-06-01 DIAGNOSIS — Z85118 Personal history of other malignant neoplasm of bronchus and lung: Secondary | ICD-10-CM

## 2015-06-01 DIAGNOSIS — C3431 Malignant neoplasm of lower lobe, right bronchus or lung: Secondary | ICD-10-CM | POA: Diagnosis not present

## 2015-06-01 DIAGNOSIS — C241 Malignant neoplasm of ampulla of Vater: Secondary | ICD-10-CM | POA: Insufficient documentation

## 2015-06-01 DIAGNOSIS — C062 Malignant neoplasm of retromolar area: Secondary | ICD-10-CM | POA: Insufficient documentation

## 2015-06-01 DIAGNOSIS — K8681 Exocrine pancreatic insufficiency: Secondary | ICD-10-CM

## 2015-06-01 DIAGNOSIS — D509 Iron deficiency anemia, unspecified: Secondary | ICD-10-CM

## 2015-06-01 DIAGNOSIS — E291 Testicular hypofunction: Secondary | ICD-10-CM

## 2015-06-01 LAB — COMPREHENSIVE METABOLIC PANEL
ALBUMIN: 3.9 g/dL (ref 3.5–5.0)
ALK PHOS: 57 U/L (ref 40–150)
ALT: 14 U/L (ref 0–55)
AST: 17 U/L (ref 5–34)
Anion Gap: 7 mEq/L (ref 3–11)
BILIRUBIN TOTAL: 0.64 mg/dL (ref 0.20–1.20)
BUN: 21.3 mg/dL (ref 7.0–26.0)
CALCIUM: 9.6 mg/dL (ref 8.4–10.4)
CO2: 31 mEq/L — ABNORMAL HIGH (ref 22–29)
Chloride: 102 mEq/L (ref 98–109)
Creatinine: 1.2 mg/dL (ref 0.7–1.3)
EGFR: 58 mL/min/{1.73_m2} — AB (ref >90)
GLUCOSE: 165 mg/dL — AB (ref 70–140)
Potassium: 4.4 mEq/L (ref 3.5–5.1)
SODIUM: 140 meq/L (ref 136–145)
TOTAL PROTEIN: 7.5 g/dL (ref 6.4–8.3)

## 2015-06-01 LAB — FERRITIN: FERRITIN: 29 ng/mL (ref 22–316)

## 2015-06-01 LAB — IRON AND TIBC
%SAT: 13 % — ABNORMAL LOW (ref 20–55)
Iron: 45 ug/dL (ref 42–163)
TIBC: 338 ug/dL (ref 202–409)
UIBC: 292 ug/dL (ref 117–376)

## 2015-06-01 LAB — CBC WITH DIFFERENTIAL (CANCER CENTER ONLY)
BASO#: 0.1 10*3/uL (ref 0.0–0.2)
BASO%: 1 % (ref 0.0–2.0)
EOS%: 5.7 % (ref 0.0–7.0)
Eosinophils Absolute: 0.4 10*3/uL (ref 0.0–0.5)
HCT: 39.3 % (ref 38.7–49.9)
HGB: 12.9 g/dL — ABNORMAL LOW (ref 13.0–17.1)
LYMPH#: 1.1 10*3/uL (ref 0.9–3.3)
LYMPH%: 17 % (ref 14.0–48.0)
MCH: 30.1 pg (ref 28.0–33.4)
MCHC: 32.8 g/dL (ref 32.0–35.9)
MCV: 92 fL (ref 82–98)
MONO#: 0.6 10*3/uL (ref 0.1–0.9)
MONO%: 9.8 % (ref 0.0–13.0)
NEUT%: 66.5 % (ref 40.0–80.0)
NEUTROS ABS: 4.2 10*3/uL (ref 1.5–6.5)
Platelets: 134 10*3/uL — ABNORMAL LOW (ref 145–400)
RBC: 4.28 10*6/uL (ref 4.20–5.70)
RDW: 14.4 % (ref 11.1–15.7)
WBC: 6.3 10*3/uL (ref 4.0–10.0)

## 2015-06-01 NOTE — Progress Notes (Signed)
Hematology and Oncology Follow Up Visit  [name] [id number] 1933/02/27 82 y.o. 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 2. 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 3. Recurrent iron-deficiency anemia. 4. Hypotestosteronemia.  Current Therapy:   IV iron as indicated.     Interim History:  [name] is back for followup. He looks quite good. He feels okay. He probably had a lot of issues with a bathroom remodeling project. He was exposed to some chemicals. He's been having some wheezing. As such, I will go ahead and get a chest x-ray on him.   His appetite has been doing pretty good. He is taking Creon now. He feels this is helped him.  He's had no change in bowel or bladder habits.  His last CA 19-9 was elevated to 226. Still not sure exactly what this implies since we have not found any obvious malignancy.  He is still working. He really enjoys work.  He and his wife do like to travel. There were visiting some family recently.   He does exercise. He and his wife walk quite a bit.  He has not noted any rashes. He's had no bony pain. He's had no chest wall pain. He's had a little bit of a cough that is nonproductive.  Overall, his performance status is ECOG 1. . Medications:  Current outpatient prescriptions:  .  atorvastatin (LIPITOR) 10 MG tablet, Take 10 mg by mouth 3 (three) times a week. , Disp: , Rfl:  .  digoxin (LANOXIN) 0.125 MG tablet, Take 0.125 mg by mouth daily., Disp: , Rfl:  .  DILT-XR 180 MG 24 hr capsule, , Disp: , Rfl: 0 .  glimepiride (AMARYL) 2 MG tablet, Take 2 mg by mouth daily before breakfast., Disp: , Rfl:  .  levothyroxine (SYNTHROID, LEVOTHROID) 100 MCG tablet, Take 100 mcg by mouth daily., Disp: , Rfl:  .  lipase/protease/amylase (CREON) 36000 UNITS CPEP capsule, Take 1 capsule (36,000 Units total) by mouth 3 (three) times daily with meals., Disp: 180 capsule, Rfl: 3 .   metFORMIN (GLUCOPHAGE) 500 MG tablet, Take 500 mg by mouth 2 (two) times daily with a meal. , Disp: , Rfl:  .  warfarin (COUMADIN) 5 MG tablet, take 1 tablet by mouth daily or as directed BY COUMADIN CLINIC, Disp: 90 tablet, Rfl: 1 .  zolpidem (AMBIEN) 10 MG tablet, Take 5 mg by mouth as needed. , Disp: , Rfl:   Allergies:  Allergies  Allergen Reactions  . Promethazine Other (See Comments)    Makes hyper    Past Medical History, Surgical history, Social history, and Family History were reviewed and updated.  Review of Systems: As above  Physical Exam:  height is '5\' 8"'$  (1.727 m) and weight is 150 lb (68.04 kg). His oral temperature is 97.4 F (36.3 C). His blood pressure is 170/76 and his pulse is 63. His respiration is 18.   Well-developed and well-nourished gentleman. He has no ocular or oral lesions. There is no adenopathy in the neck. Lungs are clearOn the right side. He has some wheezing on the left side.. Cardiac exam regular rate and rhythm. He has no murmurs, rubs or bruits. Abdomen is soft. He has a well-healed laparotomy scar. He has no fluid wave. There is no palpable liver or spleen. Back exam shows no tenderness over the spine, ribs or hips. Extremities shows no clubbing cyanosis or edema. Has good range of motion  of his joints. Skin exam no rashes. Neurological exam is nonfocal.  Lab Results  Component Value Date   WBC 6.3 06/01/2015   HGB 12.9* 06/01/2015   HCT 39.3 06/01/2015   MCV 92 06/01/2015   PLT 134* 06/01/2015     Chemistry      Component Value Date/Time   NA 139 04/01/2015 0925   NA 144 12/24/2014 0821   NA 136 10/17/2014 1242   K 4.0 04/01/2015 0925   K 4.0 12/24/2014 0821   K 4.0 10/17/2014 1242   CL 100 12/24/2014 0821   CL 102 10/17/2014 1242   CO2 26 04/01/2015 0925   CO2 31 12/24/2014 0821   CO2 30 10/17/2014 1242   BUN 17.5 04/01/2015 0925   BUN 13 12/24/2014 0821   BUN 17 10/17/2014 1242   CREATININE 1.1 04/01/2015 0925   CREATININE 1.2  12/24/2014 0821   CREATININE 1.05 10/17/2014 1242      Component Value Date/Time   CALCIUM 9.1 04/01/2015 0925   CALCIUM 9.6 12/24/2014 0821   CALCIUM 9.2 10/17/2014 1242   ALKPHOS 60 04/01/2015 0925   ALKPHOS 56 12/24/2014 0821   ALKPHOS 53 10/17/2014 1242   AST 14 04/01/2015 0925   AST 18 12/24/2014 0821   AST 20 10/17/2014 1242   ALT 14 04/01/2015 0925   ALT 16 12/24/2014 0821   ALT 20 10/17/2014 1242   BILITOT 0.64 04/01/2015 0925   BILITOT 0.70 12/24/2014 0821   BILITOT 0.7 10/17/2014 1242         Impression and Plan: [name] is 82 year old gentleman. He had a history of stage III adenocarcinoma of the pancreas. This was probably 10 years ago. He then had a stage I lung cancer resected in January of 2010.  I am Still puzzled as to the elevation of the tumor markers. Thankfully, his weight is holding steady. He feels well. On a CT scan that was done back in November, he did have a left infrahilar lymph node measuring 2.8 x 3 cm. It is conceivable that this might be the source of his wheezing, particularly if there is any kind of bronchial compression.  I think we may ultimately need to get him back to surgery or to pulmonary medicine for some type of invasive procedure and biopsy.  I spent about 30 minutes with him. He looks good. He feels good. He really wants to be conservative with any type of invasive procedure.  I'll plan to get him back in another 2-3 months.     [name], MD 4/24/201712:23 PM

## 2015-06-02 LAB — CANCER ANTIGEN 19-9 (PARALLEL TESTING): CA 19 9: 273 U/mL — AB (ref <34)

## 2015-06-02 LAB — CANCER ANTIGEN 19-9: CAN 19-9: 346 U/mL — AB (ref 0–35)

## 2015-06-02 LAB — PREALBUMIN: PREALBUMIN: 22 mg/dL (ref 9–32)

## 2015-06-03 ENCOUNTER — Other Ambulatory Visit: Payer: Self-pay | Admitting: Hematology & Oncology

## 2015-06-03 ENCOUNTER — Ambulatory Visit (INDEPENDENT_AMBULATORY_CARE_PROVIDER_SITE_OTHER): Payer: Medicare Other | Admitting: Pharmacist Clinician (PhC)/ Clinical Pharmacy Specialist

## 2015-06-03 DIAGNOSIS — Z7901 Long term (current) use of anticoagulants: Secondary | ICD-10-CM

## 2015-06-03 DIAGNOSIS — I482 Chronic atrial fibrillation, unspecified: Secondary | ICD-10-CM

## 2015-06-03 DIAGNOSIS — C241 Malignant neoplasm of ampulla of Vater: Secondary | ICD-10-CM

## 2015-06-03 DIAGNOSIS — C3431 Malignant neoplasm of lower lobe, right bronchus or lung: Secondary | ICD-10-CM

## 2015-06-03 LAB — POCT INR: INR: 2.6

## 2015-06-08 ENCOUNTER — Ambulatory Visit (HOSPITAL_BASED_OUTPATIENT_CLINIC_OR_DEPARTMENT_OTHER): Payer: Medicare Other

## 2015-06-08 ENCOUNTER — Other Ambulatory Visit: Payer: Self-pay | Admitting: Family

## 2015-06-08 VITALS — BP 172/65 | HR 50 | Temp 98.3°F | Resp 15

## 2015-06-08 DIAGNOSIS — D509 Iron deficiency anemia, unspecified: Secondary | ICD-10-CM

## 2015-06-08 MED ORDER — SODIUM CHLORIDE 0.9 % IV SOLN
Freq: Once | INTRAVENOUS | Status: AC
  Administered 2015-06-08: 12:00:00 via INTRAVENOUS

## 2015-06-08 MED ORDER — SODIUM CHLORIDE 0.9 % IV SOLN
510.0000 mg | Freq: Once | INTRAVENOUS | Status: AC
  Administered 2015-06-08: 510 mg via INTRAVENOUS
  Filled 2015-06-08: qty 17

## 2015-06-08 NOTE — Patient Instructions (Signed)

## 2015-06-10 ENCOUNTER — Encounter (HOSPITAL_COMMUNITY)
Admission: RE | Admit: 2015-06-10 | Discharge: 2015-06-10 | Disposition: A | Discharge status: Home / Self Care | Payer: Medicare Other | Source: Ambulatory Visit | Attending: Hematology & Oncology | Admitting: Hematology & Oncology

## 2015-06-10 ENCOUNTER — Ambulatory Visit (HOSPITAL_COMMUNITY): Payer: Medicare Other

## 2015-06-10 DIAGNOSIS — C241 Malignant neoplasm of ampulla of Vater: Secondary | ICD-10-CM | POA: Insufficient documentation

## 2015-06-10 DIAGNOSIS — C3431 Malignant neoplasm of lower lobe, right bronchus or lung: Secondary | ICD-10-CM

## 2015-06-10 LAB — GLUCOSE, CAPILLARY: Glucose-Capillary: 104 mg/dL — ABNORMAL HIGH (ref 65–99)

## 2015-06-10 MED ORDER — FLUDEOXYGLUCOSE F - 18 (FDG) INJECTION
7.4200 | Freq: Once | INTRAVENOUS | Status: AC | PRN
  Administered 2015-06-10: 7.42 via INTRAVENOUS

## 2015-07-01 ENCOUNTER — Ambulatory Visit (INDEPENDENT_AMBULATORY_CARE_PROVIDER_SITE_OTHER): Payer: Medicare Other | Admitting: Pharmacist

## 2015-07-01 DIAGNOSIS — I482 Chronic atrial fibrillation, unspecified: Secondary | ICD-10-CM

## 2015-07-01 DIAGNOSIS — Z7901 Long term (current) use of anticoagulants: Secondary | ICD-10-CM

## 2015-07-01 LAB — POCT INR: INR: 2.5

## 2015-08-05 ENCOUNTER — Ambulatory Visit (INDEPENDENT_AMBULATORY_CARE_PROVIDER_SITE_OTHER): Payer: Medicare Other | Admitting: Pharmacist

## 2015-08-05 DIAGNOSIS — I482 Chronic atrial fibrillation, unspecified: Secondary | ICD-10-CM

## 2015-08-05 DIAGNOSIS — Z7901 Long term (current) use of anticoagulants: Secondary | ICD-10-CM

## 2015-08-05 LAB — POCT INR: INR: 2.5

## 2015-08-31 ENCOUNTER — Ambulatory Visit (HOSPITAL_BASED_OUTPATIENT_CLINIC_OR_DEPARTMENT_OTHER): Payer: Medicare Other | Admitting: Hematology & Oncology

## 2015-08-31 ENCOUNTER — Other Ambulatory Visit (HOSPITAL_BASED_OUTPATIENT_CLINIC_OR_DEPARTMENT_OTHER): Payer: Medicare Other

## 2015-08-31 ENCOUNTER — Encounter: Payer: Self-pay | Admitting: Hematology & Oncology

## 2015-08-31 ENCOUNTER — Encounter: Payer: Self-pay | Admitting: Nurse Practitioner

## 2015-08-31 VITALS — BP 155/67 | HR 56 | Temp 98.1°F | Resp 16 | Ht 68.0 in | Wt 148.0 lb

## 2015-08-31 DIAGNOSIS — Z8507 Personal history of malignant neoplasm of pancreas: Secondary | ICD-10-CM

## 2015-08-31 DIAGNOSIS — Z85118 Personal history of other malignant neoplasm of bronchus and lung: Secondary | ICD-10-CM | POA: Diagnosis not present

## 2015-08-31 DIAGNOSIS — C3431 Malignant neoplasm of lower lobe, right bronchus or lung: Secondary | ICD-10-CM

## 2015-08-31 DIAGNOSIS — E291 Testicular hypofunction: Secondary | ICD-10-CM | POA: Diagnosis not present

## 2015-08-31 DIAGNOSIS — D509 Iron deficiency anemia, unspecified: Secondary | ICD-10-CM | POA: Diagnosis not present

## 2015-08-31 DIAGNOSIS — R634 Abnormal weight loss: Secondary | ICD-10-CM

## 2015-08-31 DIAGNOSIS — R062 Wheezing: Secondary | ICD-10-CM

## 2015-08-31 DIAGNOSIS — C241 Malignant neoplasm of ampulla of Vater: Secondary | ICD-10-CM

## 2015-08-31 LAB — IRON AND TIBC
%SAT: 27 % (ref 20–55)
IRON: 69 ug/dL (ref 42–163)
TIBC: 255 ug/dL (ref 202–409)
UIBC: 186 ug/dL (ref 117–376)

## 2015-08-31 LAB — CMP (CANCER CENTER ONLY)
ALBUMIN: 3.8 g/dL (ref 3.3–5.5)
ALT(SGPT): 25 U/L (ref 10–47)
AST: 25 U/L (ref 11–38)
Alkaline Phosphatase: 53 U/L (ref 26–84)
BILIRUBIN TOTAL: 0.9 mg/dL (ref 0.20–1.60)
BUN: 18 mg/dL (ref 7–22)
CHLORIDE: 102 meq/L (ref 98–108)
CO2: 33 mEq/L (ref 18–33)
CREATININE: 1 mg/dL (ref 0.6–1.2)
Calcium: 8.7 mg/dL (ref 8.0–10.3)
Glucose, Bld: 159 mg/dL — ABNORMAL HIGH (ref 73–118)
Potassium: 4.4 mEq/L (ref 3.3–4.7)
SODIUM: 139 meq/L (ref 128–145)
TOTAL PROTEIN: 6.9 g/dL (ref 6.4–8.1)

## 2015-08-31 LAB — CBC WITH DIFFERENTIAL (CANCER CENTER ONLY)
BASO#: 0.1 10*3/uL (ref 0.0–0.2)
BASO%: 0.8 % (ref 0.0–2.0)
EOS ABS: 0.4 10*3/uL (ref 0.0–0.5)
EOS%: 5.8 % (ref 0.0–7.0)
HCT: 40.7 % (ref 38.7–49.9)
HEMOGLOBIN: 13.3 g/dL (ref 13.0–17.1)
LYMPH#: 1.1 10*3/uL (ref 0.9–3.3)
LYMPH%: 17.3 % (ref 14.0–48.0)
MCH: 30.7 pg (ref 28.0–33.4)
MCHC: 32.7 g/dL (ref 32.0–35.9)
MCV: 94 fL (ref 82–98)
MONO#: 0.7 10*3/uL (ref 0.1–0.9)
MONO%: 11.1 % (ref 0.0–13.0)
NEUT%: 65 % (ref 40.0–80.0)
NEUTROS ABS: 4.3 10*3/uL (ref 1.5–6.5)
Platelets: 118 10*3/uL — ABNORMAL LOW (ref 145–400)
RBC: 4.33 10*6/uL (ref 4.20–5.70)
RDW: 14.2 % (ref 11.1–15.7)
WBC: 6.6 10*3/uL (ref 4.0–10.0)

## 2015-08-31 LAB — FERRITIN: Ferritin: 87 ng/ml (ref 22–316)

## 2015-08-31 LAB — LACTATE DEHYDROGENASE: LDH: 216 U/L (ref 125–245)

## 2015-08-31 NOTE — Progress Notes (Signed)
Hematology and Oncology Follow Up Visit  [name] [id number] 1933/06/12 82 y.o. 08/31/2015   Principle Diagnosis:  Stage Ia adenocarcinoma of the left lung-resected. 2. 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 3. Recurrent iron-deficiency anemia. 4. Hypotestosteronemia.  Current Therapy:   IV iron as indicated.     Interim History:  [name] is back for followup. He looks quite good. He feels okay. He is still losing weight. Today, his weight is 148 pounds.  He still has problems with diarrhea. He is on Creon but only takes it 1 or 2 a day. I told him to take the Creon 3 times a day.   He had a PET scan done back in May. PET scan only showed some activity in an anterior left second rib. No disease was noted within lymph nodes in the chest. Nothing was noted in the abdomen or liver.  His tumor markers are still elevated. His CA-19-9-9 was up to 273.   He's had better breathing. He is not wheezing. He's had no nausea or vomiting. He's had no rashes.   We have given him iron on occasion. He typically responds well to iron.   He does have the atrial fibrillation. He is on Coumadin for this. Cardiology is managing this.   His wife is doing well. She has manic depression. Thankfully, she is on had an issue with this for a while.  Overall, his performance status is ECOG 1. . Medications:  Current Outpatient Prescriptions:  .  atorvastatin (LIPITOR) 10 MG tablet, Take 10 mg by mouth 3 (three) times a week. , Disp: , Rfl:  .  digoxin (LANOXIN) 0.125 MG tablet, Take 0.125 mg by mouth daily., Disp: , Rfl:  .  DILT-XR 180 MG 24 hr capsule, , Disp: , Rfl: 0 .  glimepiride (AMARYL) 2 MG tablet, Take 2 mg by mouth daily before breakfast., Disp: , Rfl:  .  levothyroxine (SYNTHROID, LEVOTHROID) 100 MCG tablet, Take 100 mcg by mouth daily., Disp: , Rfl:  .  lipase/protease/amylase (CREON) 36000 UNITS CPEP capsule, Take 1 capsule (36,000 Units total) by mouth 3  (three) times daily with meals., Disp: 180 capsule, Rfl: 3 .  metFORMIN (GLUCOPHAGE) 500 MG tablet, Take 500 mg by mouth 2 (two) times daily with a meal. , Disp: , Rfl:  .  warfarin (COUMADIN) 5 MG tablet, take 1 tablet by mouth daily or as directed BY COUMADIN CLINIC, Disp: 90 tablet, Rfl: 1 .  zolpidem (AMBIEN) 10 MG tablet, Take 5 mg by mouth as needed. , Disp: , Rfl:   Allergies:  Allergies  Allergen Reactions  . Promethazine Other (See Comments)    Makes hyper    Past Medical History, Surgical history, Social history, and Family History were reviewed and updated.  Review of Systems: As above  Physical Exam:  height is '5\' 8"'$  (1.727 m) and weight is 148 lb (67.1 kg). His oral temperature is 98.1 F (36.7 C). His blood pressure is 155/67 (abnormal) and his pulse is 56 (abnormal). His respiration is 16.   Well-developed and well-nourished gentleman. He has no ocular or oral lesions. There is no adenopathy in the neck. Lungs are clearOn the right side. He has some wheezing on the left side.. Cardiac exam regular rate and rhythm. He has no murmurs, rubs or bruits. Abdomen is soft. He has a well-healed laparotomy scar. He has no fluid wave. There is no palpable liver or spleen. Back exam shows no tenderness  over the spine, ribs or hips. Extremities shows no clubbing cyanosis or edema. Has good range of motion of his joints. Skin exam no rashes. Neurological exam is nonfocal.  Lab Results  Component Value Date   WBC 6.6 08/31/2015   HGB 13.3 08/31/2015   HCT 40.7 08/31/2015   MCV 94 08/31/2015   PLT 118 (L) 08/31/2015     Chemistry      Component Value Date/Time   NA 139 08/31/2015 0957   NA 140 06/01/2015 1135   K 4.4 08/31/2015 0957   K 4.4 06/01/2015 1135   CL 102 08/31/2015 0957   CO2 33 08/31/2015 0957   CO2 31 (H) 06/01/2015 1135   BUN 18 08/31/2015 0957   BUN 21.3 06/01/2015 1135   CREATININE 1.0 08/31/2015 0957   CREATININE 1.2 06/01/2015 1135      Component Value  Date/Time   CALCIUM 8.7 08/31/2015 0957   CALCIUM 9.6 06/01/2015 1135   ALKPHOS 53 08/31/2015 0957   ALKPHOS 57 06/01/2015 1135   AST 25 08/31/2015 0957   AST 17 06/01/2015 1135   ALT 25 08/31/2015 0957   ALT 14 06/01/2015 1135   BILITOT 0.90 08/31/2015 0957   BILITOT 0.64 06/01/2015 1135         Impression and Plan: [name] is 82 year old gentleman. He had a history of stage III adenocarcinoma of the pancreas. This was probably 10 years ago. He then had a stage I lung cancer resected in January of 2010.   I think that we had to get another PET scan on. I'm not sure as to what this left rib abnormality is.  His past tumor markers were still elevated. I'm sure they are still elevated.   What bothers me the most is the weight loss. He is eating. He is on Creon. He may have some "dumping syndrome". I told him to go to 3 Creon a day.   I think if the next PET scan does show some more prominent activity, we will definitely need get a biopsy was set up.   I think if cancer is an issue, it would be lung cancer way ahead of the remote history of pancreatic cancer.   I do want to see him back in one month. We will get the PET scan in 2 weeks.   I spent about 30 minutes with him today. He still looks good.    [name], MD 7/24/201710:51 AM

## 2015-09-01 LAB — CANCER ANTIGEN 19-9: CA 19-9: 633 U/mL — ABNORMAL HIGH (ref 0–35)

## 2015-09-22 ENCOUNTER — Ambulatory Visit (HOSPITAL_COMMUNITY)
Admission: RE | Admit: 2015-09-22 | Discharge: 2015-09-22 | Disposition: A | Discharge status: Home / Self Care | Payer: Medicare Other | Source: Ambulatory Visit | Attending: Hematology & Oncology | Admitting: Hematology & Oncology

## 2015-09-22 DIAGNOSIS — R590 Localized enlarged lymph nodes: Secondary | ICD-10-CM | POA: Insufficient documentation

## 2015-09-22 DIAGNOSIS — C3431 Malignant neoplasm of lower lobe, right bronchus or lung: Secondary | ICD-10-CM | POA: Diagnosis not present

## 2015-09-22 DIAGNOSIS — M899 Disorder of bone, unspecified: Secondary | ICD-10-CM | POA: Insufficient documentation

## 2015-09-22 LAB — GLUCOSE, CAPILLARY: GLUCOSE-CAPILLARY: 146 mg/dL — AB (ref 65–99)

## 2015-09-22 MED ORDER — FLUDEOXYGLUCOSE F - 18 (FDG) INJECTION
7.3300 | Freq: Once | INTRAVENOUS | Status: AC | PRN
  Administered 2015-09-22: 7.33 via INTRAVENOUS

## 2015-09-24 ENCOUNTER — Ambulatory Visit (INDEPENDENT_AMBULATORY_CARE_PROVIDER_SITE_OTHER): Payer: Medicare Other | Admitting: Pharmacist

## 2015-09-24 ENCOUNTER — Ambulatory Visit (INDEPENDENT_AMBULATORY_CARE_PROVIDER_SITE_OTHER): Payer: Medicare Other | Admitting: Internal Medicine

## 2015-09-24 ENCOUNTER — Encounter: Payer: Self-pay | Admitting: Internal Medicine

## 2015-09-24 VITALS — BP 175/84 | HR 58 | Ht 67.5 in | Wt 149.4 lb

## 2015-09-24 DIAGNOSIS — R42 Dizziness and giddiness: Secondary | ICD-10-CM

## 2015-09-24 DIAGNOSIS — Z7901 Long term (current) use of anticoagulants: Secondary | ICD-10-CM

## 2015-09-24 DIAGNOSIS — I482 Chronic atrial fibrillation, unspecified: Secondary | ICD-10-CM

## 2015-09-24 DIAGNOSIS — R011 Cardiac murmur, unspecified: Secondary | ICD-10-CM | POA: Diagnosis not present

## 2015-09-24 LAB — POCT INR: INR: 2.5

## 2015-09-24 NOTE — Patient Instructions (Signed)
Your physician has recommended you make the following change in your medication: STOP digoxin   Your physician has requested that you have an echocardiogram @ [address] - [address]. Echocardiography is a painless test that uses sound waves to create images of your heart. It provides your doctor with information about the size and shape of your heart and how well your heart's chambers and valves are working. This procedure takes approximately one hour. There are no restrictions for this procedure.  Your physician recommends that you schedule a follow-up appointment in: ONE MONTH - after your echocardiogram

## 2015-09-24 NOTE — Progress Notes (Signed)
OFFICE NOTE  Chief Complaint:  Establish cardiologist  Primary Care Physician: Irven Shelling, MD  HPI:  [name] is a 82 y.o. male former patient of Dr. Rollene Fare, who last saw him in the office in 2012 therefore she is a new patient to our practice. [name] off has a history of paroxysmal atrial fibrillation in the past with sick sinus syndrome. He was a previous patient of Dr. Melvern Banker. He also had a history of possible hypertension and was on a number of medications in the past but has struggled with low blood pressure at times. He has a history of cancer, particularly pancreatic cancer status post Whipple and recurrent lung cancer which was an adenocarcinoma in 2007's status post VATS and left lower lobe wedge resection. He had a second procedure in 2010 and PAF at that time. He's also had atrial flutter. He has valvular heart disease including mitral and aortic valve disease and his last echocardiogram was in 2012 which showed normal LV function. He had a nuclear stress test in 2010 which showed no ischemia. He's not had a heart catheterization. Recently he's had some dizziness with positional changes. His diltiazem was decreased from 240-180 mg daily. He is also on warfarin for anticoagulation and takes digoxin which is been taking for a number of years. He is not had a recent digoxin level.  PMHx:  Past Medical History:  Diagnosis Date  . 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 Diabetes mellitus   . H/O echocardiogram 2003,2009,2010, 2012  . Hypertension   . Iron deficiency anemia, unspecified 01/27/2013  . Lung cancer ([place]) dx'd 02/2009   surg only  . Lung cancer, lower lobe ([place]) 01/27/2013  . pancreatic ca dx'd 2004   oral chemo/xrt comp    Past Surgical History:  Procedure Laterality Date  . VIDEO BRONCHOSCOPY WITH ENDOBRONCHIAL ULTRASOUND N/A 10/20/2014   Procedure: VIDEO BRONCHOSCOPY WITH ENDOBRONCHIAL ULTRASOUND;  Surgeon: Melrose Nakayama, MD;   Location: [facility] OR;  Service: Thoracic;  Laterality: N/A;    FAMHx:  Family History  Problem Relation Age of Onset  . Emphysema Father   . Lung cancer Brother     SOCHx:   reports that he quit smoking about 58 years ago. His smoking use included Cigarettes. He started smoking about 65 years ago. He has a 14.00 pack-year smoking history. He has never used smokeless tobacco. His alcohol and drug histories are not on file.  ALLERGIES:  Allergies  Allergen Reactions  . Promethazine Other (See Comments)    Makes hyper    ROS: Pertinent items noted in HPI and remainder of comprehensive ROS otherwise negative.  HOME MEDS: Current Outpatient Prescriptions  Medication Sig Dispense Refill  . atorvastatin (LIPITOR) 10 MG tablet Take 10 mg by mouth 3 (three) times a week.     Marland Kitchen DILT-XR 180 MG 24 hr capsule   0  . glimepiride (AMARYL) 2 MG tablet Take 2 mg by mouth daily before breakfast.    . levothyroxine (SYNTHROID, LEVOTHROID) 100 MCG tablet Take 100 mcg by mouth daily.    . lipase/protease/amylase (CREON) 36000 UNITS CPEP capsule Take 1 capsule (36,000 Units total) by mouth 3 (three) times daily with meals. 180 capsule 3  . metFORMIN (GLUCOPHAGE) 500 MG tablet Take 500 mg by mouth 2 (two) times daily with a meal.     . warfarin (COUMADIN) 5 MG tablet take 1 tablet by mouth daily or as directed BY COUMADIN CLINIC 90 tablet 1  .  zolpidem (AMBIEN) 10 MG tablet Take 5 mg by mouth as needed.      No current facility-administered medications for this visit.     LABS/IMAGING: Results for orders placed or performed in visit on 09/24/15 (from the past 48 hour(s))  POCT INR     Status: None   Collection Time: 09/24/15 10:03 AM  Result Value Ref Range   INR 2.5    No results found.  WEIGHTS: Wt Readings from Last 3 Encounters:  09/24/15 149 lb 6.4 oz (67.8 kg)  08/31/15 148 lb (67.1 kg)  06/01/15 150 lb (68 kg)    VITALS: BP (!) 175/84   Pulse (!) 58   Ht 5' 7.5" (1.715 m)   Wt  149 lb 6.4 oz (67.8 kg)   BMI 23.05 kg/m   EXAM: General appearance: alert and no distress Neck: no carotid bruit and no JVD Lungs: clear to auscultation bilaterally Heart: irregularly irregular rhythm, S1, S2 normal, systolic murmur: early systolic 2/6, blowing at apex and diastolic murmur: mid diastolic 2/6, crescendo at 2nd right intercostal space Abdomen: soft, non-tender; bowel sounds normal; no masses,  no organomegaly Extremities: varicose veins noted Pulses: 2+ and symmetric Skin: Skin color, texture, turgor normal. No rashes or lesions Neurologic: Grossly normal Psych: Pleasant  EKG: Atrial fibrillation with slow ventricular response of 58  ASSESSMENT: 1. Persistent atrial fibrillation - CHADSVASC score of 3 2. Valvular heart disease 3. Dyslipidemia 4. History of GI and lung adenocarcinoma 5. Hypertension 6. Dizziness  PLAN: 1.   [name] has a history of persistent atrial fibrillation. Is not clearly symptomatic with this. There is less evidence for ongoing use of digoxin at this time. He is decreased it before not noted a difference in his heart rate therefore like to discontinue that. We'll continue with diltiazem for rate control. He does have some arteriolosclerosis with high Korotkoff sounds indicating that his blood pressure may be overtreated at times. This arterial stiffening could be a cause of his orthostatic symptoms. Will also go and check an echocardiogram as his diastolic murmur sounds somewhat louder than described in 2012. Follow-up in one month.  Pixie Casino, MD, [facility] Attending Cardiologist [place] 09/24/2015, 1:08 PM

## 2015-10-06 ENCOUNTER — Other Ambulatory Visit: Payer: Self-pay

## 2015-10-06 ENCOUNTER — Ambulatory Visit (HOSPITAL_COMMUNITY): Payer: Medicare Other | Attending: Cardiology

## 2015-10-06 DIAGNOSIS — I119 Hypertensive heart disease without heart failure: Secondary | ICD-10-CM | POA: Insufficient documentation

## 2015-10-06 DIAGNOSIS — I340 Nonrheumatic mitral (valve) insufficiency: Secondary | ICD-10-CM | POA: Insufficient documentation

## 2015-10-06 DIAGNOSIS — I77810 Thoracic aortic ectasia: Secondary | ICD-10-CM | POA: Diagnosis not present

## 2015-10-06 DIAGNOSIS — E785 Hyperlipidemia, unspecified: Secondary | ICD-10-CM | POA: Diagnosis not present

## 2015-10-06 DIAGNOSIS — I313 Pericardial effusion (noninflammatory): Secondary | ICD-10-CM | POA: Diagnosis not present

## 2015-10-06 DIAGNOSIS — R42 Dizziness and giddiness: Secondary | ICD-10-CM | POA: Diagnosis not present

## 2015-10-06 DIAGNOSIS — R011 Cardiac murmur, unspecified: Secondary | ICD-10-CM

## 2015-10-30 ENCOUNTER — Ambulatory Visit (INDEPENDENT_AMBULATORY_CARE_PROVIDER_SITE_OTHER): Payer: Medicare Other | Admitting: Internal Medicine

## 2015-10-30 ENCOUNTER — Encounter: Payer: Self-pay | Admitting: Internal Medicine

## 2015-10-30 VITALS — BP 154/84 | HR 65 | Ht 68.0 in | Wt 148.2 lb

## 2015-10-30 DIAGNOSIS — I340 Nonrheumatic mitral (valve) insufficiency: Secondary | ICD-10-CM | POA: Diagnosis not present

## 2015-10-30 DIAGNOSIS — C3431 Malignant neoplasm of lower lobe, right bronchus or lung: Secondary | ICD-10-CM | POA: Diagnosis not present

## 2015-10-30 DIAGNOSIS — D509 Iron deficiency anemia, unspecified: Secondary | ICD-10-CM

## 2015-10-30 DIAGNOSIS — C241 Malignant neoplasm of ampulla of Vater: Secondary | ICD-10-CM

## 2015-10-30 DIAGNOSIS — R062 Wheezing: Secondary | ICD-10-CM | POA: Diagnosis not present

## 2015-10-30 MED ORDER — DILT-XR 180 MG PO CP24: 180.0000 mg | ORAL_CAPSULE | Freq: Every day | ORAL | 3 refills | Status: DC

## 2015-10-30 NOTE — Patient Instructions (Signed)
Medication Instructions:  Your physician recommends that you continue on your current medications as directed. Please refer to the Current Medication list given to you today.  Labwork: NONE  Testing/Procedures: NONE  Follow-Up: Your physician wants you to follow-up in: 6 MONTHS WITH DR HILTY. You will receive a reminder letter in the mail two months in advance. If you don't receive a letter, please call our office to schedule the follow-up appointment.     If you need a refill on your cardiac medications before your next appointment, please call your pharmacy.

## 2015-11-02 DIAGNOSIS — I340 Nonrheumatic mitral (valve) insufficiency: Secondary | ICD-10-CM | POA: Insufficient documentation

## 2015-11-02 DIAGNOSIS — R062 Wheezing: Secondary | ICD-10-CM | POA: Insufficient documentation

## 2015-11-02 NOTE — Progress Notes (Signed)
OFFICE NOTE  Chief Complaint:  Follow-up echo  Primary Care Physician: Irven Shelling, MD  HPI:  [name] is a 82 y.o. male former patient of Dr. Rollene Fare, who last saw him in the office in 2012 therefore she is a new patient to our practice. [name] off has a history of paroxysmal atrial fibrillation in the past with sick sinus syndrome. He was a previous patient of Dr. Melvern Banker. He also had a history of possible hypertension and was on a number of medications in the past but has struggled with low blood pressure at times. He has a history of cancer, particularly pancreatic cancer status post Whipple and recurrent lung cancer which was an adenocarcinoma in 2007's status post VATS and left lower lobe wedge resection. He had a second procedure in 2010 and PAF at that time. He's also had atrial flutter. He has valvular heart disease including mitral and aortic valve disease and his last echocardiogram was in 2012 which showed normal LV function. He had a nuclear stress test in 2010 which showed no ischemia. He's not had a heart catheterization. Recently he's had some dizziness with positional changes. His diltiazem was decreased from 240-180 mg daily. He is also on warfarin for anticoagulation and takes digoxin which is been taking for a number of years. He is not had a recent digoxin level.  10/30/2015  Mr. Kilgallon returns today for follow-up. He discontinued digoxin and notes no difference in his a-fib. Echo was performed and demonstrates LVEF 65-70% with  moderate to severe mitral regurgitaton and biatrial enlargement. Prolapse of the posterior mitral leaflet is noted. He does reports some fatigue and dyspnea when walking up stairs or inclines, but does fine on level ground.  PMHx:  Past Medical History:  Diagnosis Date  . 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 Diabetes mellitus   . H/O echocardiogram 2003,2009,2010, 2012  . Hypertension   . Iron deficiency anemia,  unspecified 01/27/2013  . Lung cancer ([place]) dx'd 02/2009   surg only  . Lung cancer, lower lobe ([place]) 01/27/2013  . pancreatic ca dx'd 2004   oral chemo/xrt comp    Past Surgical History:  Procedure Laterality Date  . VIDEO BRONCHOSCOPY WITH ENDOBRONCHIAL ULTRASOUND N/A 10/20/2014   Procedure: VIDEO BRONCHOSCOPY WITH ENDOBRONCHIAL ULTRASOUND;  Surgeon: Melrose Nakayama, MD;  Location: [facility] OR;  Service: Thoracic;  Laterality: N/A;    FAMHx:  Family History  Problem Relation Age of Onset  . Emphysema Father   . Lung cancer Brother     SOCHx:   reports that he quit smoking about 58 years ago. His smoking use included Cigarettes. He started smoking about 65 years ago. He has a 14.00 pack-year smoking history. He has never used smokeless tobacco. His alcohol and drug histories are not on file.  ALLERGIES:  Allergies  Allergen Reactions  . Promethazine Other (See Comments)    Makes hyper    ROS: Pertinent items noted in HPI and remainder of comprehensive ROS otherwise negative.  HOME MEDS: Current Outpatient Prescriptions  Medication Sig Dispense Refill  . atorvastatin (LIPITOR) 10 MG tablet Take 10 mg by mouth 3 (three) times a week.     Marland Kitchen DILT-XR 180 MG 24 hr capsule Take 1 capsule (180 mg total) by mouth daily. 90 capsule 3  . glimepiride (AMARYL) 2 MG tablet Take 2 mg by mouth daily before breakfast.    . levothyroxine (SYNTHROID, LEVOTHROID) 100 MCG tablet Take 100 mcg by mouth  daily.    . metFORMIN (GLUCOPHAGE) 500 MG tablet Take 500 mg by mouth 2 (two) times daily with a meal.     . warfarin (COUMADIN) 5 MG tablet take 1 tablet by mouth daily or as directed BY COUMADIN CLINIC 90 tablet 1  . zolpidem (AMBIEN) 10 MG tablet Take 5 mg by mouth as needed.      No current facility-administered medications for this visit.     LABS/IMAGING: No results found for this or any previous visit (from the past 48 hour(s)). No results found.  WEIGHTS: Wt Readings from Last 3  Encounters:  10/30/15 148 lb 3.2 oz (67.2 kg)  09/24/15 149 lb 6.4 oz (67.8 kg)  08/31/15 148 lb (67.1 kg)    VITALS: BP (!) 154/84   Pulse 65   Ht '5\' 8"'$  (1.727 m)   Wt 148 lb 3.2 oz (67.2 kg)   BMI 22.53 kg/m   EXAM: Deferred  EKG: Deferred  ASSESSMENT: 1. Persistent atrial fibrillation - CHADSVASC score of 3 2. Moderate to severe MR - possibly symptomatic 3. Dyslipidemia 4. History of GI and lung adenocarcinoma 5. Hypertension 6. Dizziness  PLAN: 1.   Mr. Iddings has moderate to severe eccentric MR with posterior MV leaflet prolapse on echo and dyspnea with exertion. His MR may be more significant than on TTE and associated with his symptoms. I'm recommending TEE to evaluate further. I did discuss the procedure, including risks/benefits/alternatives with him today and he wishes to consider it further and will contact me if he wants to go forward with it.  Pixie Casino, MD, [facility] Attending Cardiologist [place] 11/02/2015, 10:09 PM

## 2015-11-05 ENCOUNTER — Ambulatory Visit (INDEPENDENT_AMBULATORY_CARE_PROVIDER_SITE_OTHER): Payer: Medicare Other | Admitting: Pharmacist Clinician (PhC)/ Clinical Pharmacy Specialist

## 2015-11-05 DIAGNOSIS — I482 Chronic atrial fibrillation, unspecified: Secondary | ICD-10-CM

## 2015-11-05 DIAGNOSIS — Z7901 Long term (current) use of anticoagulants: Secondary | ICD-10-CM

## 2015-11-05 LAB — POCT INR: INR: 3.9

## 2015-11-23 ENCOUNTER — Ambulatory Visit (INDEPENDENT_AMBULATORY_CARE_PROVIDER_SITE_OTHER): Payer: Medicare Other | Admitting: Pharmacist

## 2015-11-23 DIAGNOSIS — I482 Chronic atrial fibrillation, unspecified: Secondary | ICD-10-CM

## 2015-11-23 DIAGNOSIS — Z7901 Long term (current) use of anticoagulants: Secondary | ICD-10-CM

## 2015-11-23 LAB — POCT INR: INR: 2.4

## 2015-12-29 ENCOUNTER — Ambulatory Visit (INDEPENDENT_AMBULATORY_CARE_PROVIDER_SITE_OTHER): Payer: Medicare Other | Admitting: Pharmacist

## 2015-12-29 DIAGNOSIS — Z7901 Long term (current) use of anticoagulants: Secondary | ICD-10-CM | POA: Diagnosis not present

## 2015-12-29 DIAGNOSIS — I482 Chronic atrial fibrillation, unspecified: Secondary | ICD-10-CM

## 2015-12-29 LAB — POCT INR: INR: 2.4

## 2015-12-29 MED ORDER — RIVAROXABAN 20 MG PO TABS: 20.0000 mg | ORAL_TABLET | Freq: Every day | ORAL | 5 refills | Status: DC

## 2016-01-22 ENCOUNTER — Telehealth: Payer: Self-pay | Admitting: Internal Medicine

## 2016-01-22 NOTE — Telephone Encounter (Signed)
Faxed additional clinical info to insurance company

## 2016-01-22 NOTE — Telephone Encounter (Signed)
PA for xarelto submitted via covermymeds  Key: [id number] - PA Case ID: [id number]

## 2016-01-25 NOTE — Telephone Encounter (Signed)
xarelto '20mg'$  approved under medicare part D thru 02/06/2017

## 2016-02-10 ENCOUNTER — Ambulatory Visit (INDEPENDENT_AMBULATORY_CARE_PROVIDER_SITE_OTHER): Payer: Medicare Other | Admitting: Pharmacist

## 2016-02-10 DIAGNOSIS — I4891 Unspecified atrial fibrillation: Secondary | ICD-10-CM | POA: Diagnosis not present

## 2016-02-10 DIAGNOSIS — I482 Chronic atrial fibrillation, unspecified: Secondary | ICD-10-CM

## 2016-02-10 DIAGNOSIS — Z7901 Long term (current) use of anticoagulants: Secondary | ICD-10-CM | POA: Diagnosis not present

## 2016-02-10 LAB — POCT INR: INR: 1

## 2016-02-29 ENCOUNTER — Ambulatory Visit (INDEPENDENT_AMBULATORY_CARE_PROVIDER_SITE_OTHER): Payer: Medicare Other | Admitting: Pharmacist Clinician (PhC)/ Clinical Pharmacy Specialist

## 2016-02-29 DIAGNOSIS — I4891 Unspecified atrial fibrillation: Secondary | ICD-10-CM

## 2016-02-29 DIAGNOSIS — Z7901 Long term (current) use of anticoagulants: Secondary | ICD-10-CM | POA: Diagnosis not present

## 2016-02-29 DIAGNOSIS — I482 Chronic atrial fibrillation, unspecified: Secondary | ICD-10-CM

## 2016-02-29 LAB — PROTIME-INR
INR: 7.3 — AB
PROTHROMBIN TIME: 71.8 s — AB (ref 9.0–11.5)

## 2016-02-29 LAB — POCT INR: INR: 7.4

## 2016-03-07 ENCOUNTER — Ambulatory Visit (INDEPENDENT_AMBULATORY_CARE_PROVIDER_SITE_OTHER): Payer: Medicare Other | Admitting: Pharmacist Clinician (PhC)/ Clinical Pharmacy Specialist

## 2016-03-07 DIAGNOSIS — I482 Chronic atrial fibrillation, unspecified: Secondary | ICD-10-CM

## 2016-03-07 DIAGNOSIS — Z7901 Long term (current) use of anticoagulants: Secondary | ICD-10-CM

## 2016-03-07 DIAGNOSIS — I4891 Unspecified atrial fibrillation: Secondary | ICD-10-CM

## 2016-03-07 LAB — POCT INR: INR: 1.7

## 2016-03-14 ENCOUNTER — Other Ambulatory Visit: Payer: Self-pay | Admitting: Hematology & Oncology

## 2016-03-14 DIAGNOSIS — C241 Malignant neoplasm of ampulla of Vater: Secondary | ICD-10-CM

## 2016-03-14 DIAGNOSIS — D509 Iron deficiency anemia, unspecified: Secondary | ICD-10-CM

## 2016-03-14 DIAGNOSIS — C3432 Malignant neoplasm of lower lobe, left bronchus or lung: Secondary | ICD-10-CM

## 2016-03-21 ENCOUNTER — Ambulatory Visit (INDEPENDENT_AMBULATORY_CARE_PROVIDER_SITE_OTHER): Payer: Medicare Other | Admitting: Pharmacist

## 2016-03-21 DIAGNOSIS — I4891 Unspecified atrial fibrillation: Secondary | ICD-10-CM | POA: Diagnosis not present

## 2016-03-21 DIAGNOSIS — Z7901 Long term (current) use of anticoagulants: Secondary | ICD-10-CM | POA: Diagnosis not present

## 2016-03-21 DIAGNOSIS — I482 Chronic atrial fibrillation, unspecified: Secondary | ICD-10-CM

## 2016-03-21 LAB — POCT INR: INR: 3.5

## 2016-03-29 ENCOUNTER — Other Ambulatory Visit (HOSPITAL_BASED_OUTPATIENT_CLINIC_OR_DEPARTMENT_OTHER): Payer: Medicare Other

## 2016-03-29 ENCOUNTER — Ambulatory Visit (HOSPITAL_BASED_OUTPATIENT_CLINIC_OR_DEPARTMENT_OTHER): Payer: Medicare Other

## 2016-03-29 ENCOUNTER — Ambulatory Visit (HOSPITAL_BASED_OUTPATIENT_CLINIC_OR_DEPARTMENT_OTHER): Payer: Medicare Other | Admitting: Hematology & Oncology

## 2016-03-29 VITALS — BP 135/69

## 2016-03-29 VITALS — BP 148/85 | HR 108 | Temp 98.6°F | Resp 18 | Wt 151.1 lb

## 2016-03-29 DIAGNOSIS — Z8507 Personal history of malignant neoplasm of pancreas: Secondary | ICD-10-CM

## 2016-03-29 DIAGNOSIS — D509 Iron deficiency anemia, unspecified: Secondary | ICD-10-CM

## 2016-03-29 DIAGNOSIS — C3432 Malignant neoplasm of lower lobe, left bronchus or lung: Secondary | ICD-10-CM

## 2016-03-29 DIAGNOSIS — Z85118 Personal history of other malignant neoplasm of bronchus and lung: Secondary | ICD-10-CM

## 2016-03-29 DIAGNOSIS — C241 Malignant neoplasm of ampulla of Vater: Secondary | ICD-10-CM

## 2016-03-29 DIAGNOSIS — D508 Other iron deficiency anemias: Secondary | ICD-10-CM

## 2016-03-29 DIAGNOSIS — C3431 Malignant neoplasm of lower lobe, right bronchus or lung: Secondary | ICD-10-CM

## 2016-03-29 DIAGNOSIS — D500 Iron deficiency anemia secondary to blood loss (chronic): Secondary | ICD-10-CM

## 2016-03-29 LAB — COMPREHENSIVE METABOLIC PANEL (CC13)
ALK PHOS: 60 IU/L (ref 39–117)
ALT: 11 IU/L (ref 0–44)
AST: 13 IU/L (ref 0–40)
Albumin, Serum: 4.1 g/dL (ref 3.5–4.7)
Albumin/Globulin Ratio: 1.5 (ref 1.2–2.2)
BILIRUBIN TOTAL: 0.6 mg/dL (ref 0.0–1.2)
BUN/Creatinine Ratio: 17 (ref 10–24)
BUN: 15 mg/dL (ref 8–27)
CHLORIDE: 99 mmol/L (ref 96–106)
Calcium, Ser: 9.7 mg/dL (ref 8.6–10.2)
Carbon Dioxide, Total: 30 mmol/L — ABNORMAL HIGH (ref 18–29)
Creatinine, Ser: 0.9 mg/dL (ref 0.76–1.27)
GFR calc non Af Amer: 80 (ref >59)
GFR, EST AFRICAN AMERICAN: 93 (ref >59)
GLUCOSE: 206 mg/dL — AB (ref 65–99)
Globulin, Total: 2.7 (ref 1.5–4.5)
POTASSIUM: 4 mmol/L (ref 3.5–5.2)
Sodium: 134 mmol/L (ref 134–144)
TOTAL PROTEIN: 6.8 g/dL (ref 6.0–8.5)

## 2016-03-29 LAB — CHCC SATELLITE - SMEAR

## 2016-03-29 LAB — CBC WITH DIFFERENTIAL (CANCER CENTER ONLY)
BASO#: 0 10*3/uL (ref 0.0–0.2)
BASO%: 0.6 % (ref 0.0–2.0)
EOS%: 2.9 % (ref 0.0–7.0)
Eosinophils Absolute: 0.2 10*3/uL (ref 0.0–0.5)
HCT: 31.8 % — ABNORMAL LOW (ref 38.7–49.9)
HEMOGLOBIN: 10.1 g/dL — AB (ref 13.0–17.1)
LYMPH#: 1 10*3/uL (ref 0.9–3.3)
LYMPH%: 15.4 % (ref 14.0–48.0)
MCH: 28.9 pg (ref 28.0–33.4)
MCHC: 31.8 g/dL — AB (ref 32.0–35.9)
MCV: 91 fL (ref 82–98)
MONO#: 0.8 10*3/uL (ref 0.1–0.9)
MONO%: 12.5 % (ref 0.0–13.0)
NEUT%: 68.6 % (ref 40.0–80.0)
NEUTROS ABS: 4.5 10*3/uL (ref 1.5–6.5)
PLATELETS: 181 10*3/uL (ref 145–400)
RBC: 3.5 10*6/uL — ABNORMAL LOW (ref 4.20–5.70)
RDW: 15.1 % (ref 11.1–15.7)
WBC: 6.5 10*3/uL (ref 4.0–10.0)

## 2016-03-29 MED ORDER — SODIUM CHLORIDE 0.9 % IV SOLN
Freq: Once | INTRAVENOUS | Status: AC
  Administered 2016-03-29: 15:00:00 via INTRAVENOUS

## 2016-03-29 MED ORDER — SODIUM CHLORIDE 0.9 % IV SOLN
510.0000 mg | Freq: Once | INTRAVENOUS | Status: AC
  Administered 2016-03-29: 510 mg via INTRAVENOUS
  Filled 2016-03-29: qty 17

## 2016-03-29 NOTE — Patient Instructions (Signed)

## 2016-03-29 NOTE — Progress Notes (Signed)
Hematology and Oncology Follow Up Visit  [name] [id number] February 01, 1934 83 y.o. 03/29/2016   Principle Diagnosis:  Stage Ia adenocarcinoma of the left lung-resected. 2. 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 3. Recurrent iron-deficiency anemia. 4. Hypotestosteronemia.  Current Therapy:   IV iron as indicated.     Interim History:  [name] is back for followup. We have not seen him for a while. Last saw him back in July.  We have been following him for the possibility of recurrence of either lung or pancreatic cancer. We saw him back in July, his CA 19-9 was up to 633. We then did a PET scan on him. The PET scan did not show any obvious evidence of recurrent lung or pancreatic cancer. He had stable hypermetabolic changes in the left infrahilar region.  I have sent him to thoracic surgery who did not feel that they needed to do any type of exploratory procedure.  He is totally asymptomatic. He is eating well. He has been gaining weight. He said he had a little bit of a upper respiratory infection recently and lost a little bit of weight.  He is still working. If he does have diabetes. He is watching his blood sugars.  He does have iron deficiency. Back in July, his ferritin was 87 and iron saturation was 27%.  He does feel more fatigued. He tries to exercise and walk. He's not able to do as much because of decrease in stamina.  He's had no obvious bleeding. He is on Coumadin for atrial fibrillation.  He's had no headache. He's had no rashes. He does have occasional leg swelling which might be from the atrial fibrillation or possibly from medications.  Overall, his performance status is ECOG 1.    Medications:  Current Outpatient Prescriptions:  .  warfarin (COUMADIN) 2.5 MG tablet, Take 2.5 mg by mouth daily., Disp: , Rfl:  .  warfarin (COUMADIN) 5 MG tablet, Take 5 mg by mouth daily., Disp: , Rfl:  .  atorvastatin (LIPITOR) 10 MG tablet, Take  10 mg by mouth 3 (three) times a week. , Disp: , Rfl:  .  DILT-XR 180 MG 24 hr capsule, Take 1 capsule (180 mg total) by mouth daily., Disp: 90 capsule, Rfl: 3 .  glimepiride (AMARYL) 2 MG tablet, Take 2 mg by mouth daily before breakfast., Disp: , Rfl:  .  levothyroxine (SYNTHROID, LEVOTHROID) 100 MCG tablet, Take 100 mcg by mouth daily., Disp: , Rfl:  .  metFORMIN (GLUCOPHAGE) 500 MG tablet, Take 500 mg by mouth 2 (two) times daily with a meal. , Disp: , Rfl:  .  zolpidem (AMBIEN) 10 MG tablet, Take 5 mg by mouth as needed. , Disp: , Rfl:   Allergies:  Allergies  Allergen Reactions  . Promethazine Other (See Comments)    Makes hyper    Past Medical History, Surgical history, Social history, and Family History were reviewed and updated.  Review of Systems: As above  Physical Exam:  weight is 151 lb 1.9 oz (68.5 kg). His oral temperature is 98.6 F (37 C). His blood pressure is 148/85 (abnormal) and his pulse is 108 (abnormal). His respiration is 18 and oxygen saturation is 98%.   Well-developed and well-nourished gentleman. He has no ocular or oral lesions. There is no adenopathy in the neck. Lungs are clear on the right side. He has good breath sounds on the left side.. Cardiac exam irregular rate and rhythm consistent with atrial  fibrillation. He has no murmurs, rubs or bruits. Abdomen is soft. He has a well-healed laparotomy scar. He has no fluid wave. He has no palpable abdominal mass. There is no palpable liver or spleen. Back exam shows no tenderness over the spine, ribs or hips. Extremities shows no clubbing cyanosis or edema. Has good range of motion of his joints. Skin exam no rashes. Neurological exam is nonfocal.  Lab Results  Component Value Date   WBC 6.5 03/29/2016   HGB 10.1 (L) 03/29/2016   HCT 31.8 (L) 03/29/2016   MCV 91 03/29/2016   PLT 181 03/29/2016     Chemistry      Component Value Date/Time   NA 139 08/31/2015 0957   NA 140 06/01/2015 1135   K 4.4  08/31/2015 0957   K 4.4 06/01/2015 1135   CL 102 08/31/2015 0957   CO2 33 08/31/2015 0957   CO2 31 (H) 06/01/2015 1135   BUN 18 08/31/2015 0957   BUN 21.3 06/01/2015 1135   CREATININE 1.0 08/31/2015 0957   CREATININE 1.2 06/01/2015 1135      Component Value Date/Time   CALCIUM 8.7 08/31/2015 0957   CALCIUM 9.6 06/01/2015 1135   ALKPHOS 53 08/31/2015 0957   ALKPHOS 57 06/01/2015 1135   AST 25 08/31/2015 0957   AST 17 06/01/2015 1135   ALT 25 08/31/2015 0957   ALT 14 06/01/2015 1135   BILITOT 0.90 08/31/2015 0957   BILITOT 0.64 06/01/2015 1135         Impression and Plan: [name] is 83 year old gentleman. He had a history of stage III adenocarcinoma of the pancreas. This was probably 10 years ago. He then had a stage I lung cancer resected in January of 2010.  We will going to give him some IV iron today. His blood count is down. This always indicates iron deficiency. We will give him the IV iron today.  We will have to see what his CA-19-9 is. Again, I know this is quite elevated. I just cannot find any obvious malignancy.  I'm glad that he is gaining weight.  We may have to consider another PET scan on him. He may have to consider just a CT scan.  I will like to see him back in about 6 weeks or so.  I spent about 40 minutes with him today.    [name], MD 2/20/20183:02 PM

## 2016-03-30 LAB — IRON AND TIBC
%SAT: 9 % — ABNORMAL LOW (ref 20–55)
Iron: 33 ug/dL — ABNORMAL LOW (ref 42–163)
TIBC: 355 ug/dL (ref 202–409)
UIBC: 322 ug/dL (ref 117–376)

## 2016-03-30 LAB — CANCER ANTIGEN 19-9: CAN 19-9: 671 U/mL — AB (ref 0–35)

## 2016-03-30 LAB — FERRITIN: Ferritin: 42 ng/ml (ref 22–316)

## 2016-03-31 ENCOUNTER — Telehealth: Payer: Self-pay | Admitting: *Deleted

## 2016-03-31 NOTE — Telephone Encounter (Addendum)
Patient is aware of results. Appointment made  ----- Message from [name], MD sent at 03/30/2016  7:37 PM EST ----- Call - the iron is very low!!  He needs 1 more dose of Feraheme!!!  Please set up for 1-2 weeks!!  Laurey Arrow

## 2016-04-04 ENCOUNTER — Ambulatory Visit (INDEPENDENT_AMBULATORY_CARE_PROVIDER_SITE_OTHER): Payer: Medicare Other | Admitting: Pharmacist Clinician (PhC)/ Clinical Pharmacy Specialist

## 2016-04-04 DIAGNOSIS — Z7901 Long term (current) use of anticoagulants: Secondary | ICD-10-CM | POA: Diagnosis not present

## 2016-04-04 DIAGNOSIS — I4891 Unspecified atrial fibrillation: Secondary | ICD-10-CM

## 2016-04-04 DIAGNOSIS — I482 Chronic atrial fibrillation, unspecified: Secondary | ICD-10-CM

## 2016-04-04 LAB — POCT INR: INR: 2.1

## 2016-04-05 ENCOUNTER — Ambulatory Visit (HOSPITAL_BASED_OUTPATIENT_CLINIC_OR_DEPARTMENT_OTHER): Payer: Medicare Other

## 2016-04-05 VITALS — BP 137/76 | HR 67 | Temp 97.4°F | Resp 18

## 2016-04-05 DIAGNOSIS — D500 Iron deficiency anemia secondary to blood loss (chronic): Secondary | ICD-10-CM

## 2016-04-05 DIAGNOSIS — D509 Iron deficiency anemia, unspecified: Secondary | ICD-10-CM

## 2016-04-05 MED ORDER — SODIUM CHLORIDE 0.9 % IV SOLN
Freq: Once | INTRAVENOUS | Status: AC
  Administered 2016-04-05: 15:00:00 via INTRAVENOUS

## 2016-04-05 MED ORDER — FERUMOXYTOL INJECTION 510 MG/17 ML
510.0000 mg | Freq: Once | INTRAVENOUS | Status: AC
  Administered 2016-04-05: 510 mg via INTRAVENOUS
  Filled 2016-04-05: qty 17

## 2016-05-02 ENCOUNTER — Ambulatory Visit (INDEPENDENT_AMBULATORY_CARE_PROVIDER_SITE_OTHER): Payer: Medicare Other | Admitting: Pharmacist Clinician (PhC)/ Clinical Pharmacy Specialist

## 2016-05-02 DIAGNOSIS — I4891 Unspecified atrial fibrillation: Secondary | ICD-10-CM | POA: Diagnosis not present

## 2016-05-02 DIAGNOSIS — I482 Chronic atrial fibrillation, unspecified: Secondary | ICD-10-CM

## 2016-05-02 DIAGNOSIS — Z7901 Long term (current) use of anticoagulants: Secondary | ICD-10-CM

## 2016-05-02 LAB — POCT INR: INR: 3.2

## 2016-05-03 ENCOUNTER — Ambulatory Visit (INDEPENDENT_AMBULATORY_CARE_PROVIDER_SITE_OTHER): Payer: Medicare Other | Admitting: Internal Medicine

## 2016-05-03 ENCOUNTER — Encounter: Payer: Self-pay | Admitting: Internal Medicine

## 2016-05-03 VITALS — BP 130/80 | HR 92 | Ht 67.0 in | Wt 149.8 lb

## 2016-05-03 DIAGNOSIS — I4811 Longstanding persistent atrial fibrillation: Secondary | ICD-10-CM

## 2016-05-03 DIAGNOSIS — Z7901 Long term (current) use of anticoagulants: Secondary | ICD-10-CM

## 2016-05-03 DIAGNOSIS — I481 Persistent atrial fibrillation: Secondary | ICD-10-CM | POA: Diagnosis not present

## 2016-05-03 DIAGNOSIS — I340 Nonrheumatic mitral (valve) insufficiency: Secondary | ICD-10-CM

## 2016-05-03 NOTE — Patient Instructions (Signed)
Your physician has requested that you have an echocardiogram @ [address] in 6 months. Echocardiography is a painless test that uses sound waves to create images of your heart. It provides your doctor with information about the size and shape of your heart and how well your heart's chambers and valves are working. This procedure takes approximately one hour. There are no restrictions for this procedure.  Your physician wants you to follow-up in: 6 months with Dr. Debara Pickett after echo. You will receive a reminder letter in the mail two months in advance. If you don't receive a letter, please call our office to schedule the follow-up appointment.

## 2016-05-04 NOTE — Progress Notes (Signed)
OFFICE NOTE  Chief Complaint:  Follow-up  Primary Care Physician: Irven Shelling, MD  HPI:  [name] is a 83 y.o. male former patient of Dr. Rollene Fare, who last saw him in the office in 2012 therefore she is a new patient to our practice. [name] has a history of paroxysmal atrial fibrillation in the past with sick sinus syndrome. He was a previous patient of Dr. Melvern Banker. He also had a history of possible hypertension and was on a number of medications in the past but has struggled with low blood pressure at times. He has a history of cancer, particularly pancreatic cancer status post Whipple and recurrent lung cancer which was an adenocarcinoma in 2007's status post VATS and left lower lobe wedge resection. He had a second procedure in 2010 and PAF at that time. He's also had atrial flutter. He has valvular heart disease including mitral and aortic valve disease and his last echocardiogram was in 2012 which showed normal LV function. He had a nuclear stress test in 2010 which showed no ischemia. He's not had a heart catheterization. Recently he's had some dizziness with positional changes. His diltiazem was decreased from 240-180 mg daily. He is also on warfarin for anticoagulation and takes digoxin which is been taking for a number of years. He is not had a recent digoxin level.  10/30/2015  [name] returns today for follow-up. He discontinued digoxin and notes no difference in his a-fib. Echo was performed and demonstrates LVEF 65-70% with  moderate to severe mitral regurgitaton and biatrial enlargement. Prolapse of the posterior mitral leaflet is noted. He does reports some fatigue and dyspnea when walking up stairs or inclines, but does fine on level ground.  05/03/2016  [name] for returns today for follow-up. Over the past 6 months she's done pretty well. He denies any worsening shortness of breath or chest pain. Blood pressure is well-controlled today 130/80  and weight is appropriate. As we have previously discussed his echo showed more moderate to severe mitral regurgitation. We have previously discussed possible TEE however he was not too interested in it. Since he is relatively asymptomatic, he would prefer rather to have another surface echo to see if his mitral regurgitation is stable. He remains in atrial fibrillation which is likely permanent at a rate of 92.  PMHx:  Past Medical History:  Diagnosis Date  . 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 Diabetes mellitus   . H/O echocardiogram 2003,2009,2010, 2012  . Hypertension   . Iron deficiency anemia, unspecified 01/27/2013  . Lung cancer ([place]) dx'd 02/2009   surg only  . Lung cancer, lower lobe ([place]) 01/27/2013  . pancreatic ca dx'd 2004   oral chemo/xrt comp    Past Surgical History:  Procedure Laterality Date  . VIDEO BRONCHOSCOPY WITH ENDOBRONCHIAL ULTRASOUND N/A 10/20/2014   Procedure: VIDEO BRONCHOSCOPY WITH ENDOBRONCHIAL ULTRASOUND;  Surgeon: Melrose Nakayama, MD;  Location: [facility] OR;  Service: Thoracic;  Laterality: N/A;    FAMHx:  Family History  Problem Relation Age of Onset  . Emphysema Father   . Lung cancer Brother     SOCHx:   reports that he quit smoking about 59 years ago. His smoking use included Cigarettes. He started smoking about 65 years ago. He has a 14.00 pack-year smoking history. He has never used smokeless tobacco. His alcohol and drug histories are not on file.  ALLERGIES:  Allergies  Allergen Reactions  . Promethazine Other (See Comments)  Makes hyper    ROS: Pertinent items noted in HPI and remainder of comprehensive ROS otherwise negative.  HOME MEDS: Current Outpatient Prescriptions  Medication Sig Dispense Refill  . atorvastatin (LIPITOR) 10 MG tablet Take 10 mg by mouth 3 (three) times a week.     . diltiazem (DILACOR XR) 240 MG 24 hr capsule Take 240 mg by mouth daily.    Marland Kitchen glimepiride (AMARYL) 2 MG tablet Take 2 mg by mouth  daily before breakfast.    . levothyroxine (SYNTHROID, LEVOTHROID) 100 MCG tablet Take 100 mcg by mouth daily.    . metFORMIN (GLUCOPHAGE) 500 MG tablet Take 500 mg by mouth 2 (two) times daily with a meal.     . warfarin (COUMADIN) 2.5 MG tablet Take 2.5 mg by mouth daily.    Marland Kitchen warfarin (COUMADIN) 5 MG tablet Take 5 mg by mouth daily.    Marland Kitchen zolpidem (AMBIEN) 10 MG tablet Take 5 mg by mouth as needed.      No current facility-administered medications for this visit.     LABS/IMAGING: No results found for this or any previous visit (from the past 48 hour(s)). No results found.  WEIGHTS: Wt Readings from Last 3 Encounters:  05/03/16 149 lb 12.8 oz (67.9 kg)  03/29/16 151 lb 1.9 oz (68.5 kg)  10/30/15 148 lb 3.2 oz (67.2 kg)    VITALS: BP 130/80   Pulse 92   Ht '5\' 7"'$  (1.702 m)   Wt 149 lb 12.8 oz (67.9 kg)   BMI 23.46 kg/m   EXAM: General appearance: alert and no distress Lungs: clear to auscultation bilaterally Heart: irregularly irregular rhythm Extremities: extremities normal, atraumatic, no cyanosis or edema Neurologic: Grossly normal  EKG: Atrial fibrillation at 92  ASSESSMENT: 1. Long-standing persistent atrial fibrillation - CHADSVASC score of 3 2. Moderate to severe MR - possibly symptomatic 3. Dyslipidemia 4. History of GI and lung adenocarcinoma 5. Hypertension 6. Dizziness  PLAN: 1.   [name] is not consistently symptomatic with his MR. He would not like to have it TEE at this point and we will repeat a 2-D echocardiogram in 6 months prior to his follow-up.  He is a long-standing persistent if not permanent atrial fibrillation with a CHADSVASC score of 3 and appropriately anticoagulated. Follow-up with me in 6 months.  Pixie Casino, MD, [facility] Attending Cardiologist [place] 05/04/2016, 5:39 PM

## 2016-05-10 ENCOUNTER — Other Ambulatory Visit (HOSPITAL_BASED_OUTPATIENT_CLINIC_OR_DEPARTMENT_OTHER): Payer: Medicare Other

## 2016-05-10 ENCOUNTER — Ambulatory Visit (HOSPITAL_BASED_OUTPATIENT_CLINIC_OR_DEPARTMENT_OTHER): Payer: Medicare Other | Admitting: Hematology & Oncology

## 2016-05-10 ENCOUNTER — Ambulatory Visit: Payer: Medicare Other

## 2016-05-10 VITALS — BP 141/101 | HR 100 | Temp 97.6°F | Resp 17 | Wt 150.0 lb

## 2016-05-10 DIAGNOSIS — Z8509 Personal history of malignant neoplasm of other digestive organs: Secondary | ICD-10-CM

## 2016-05-10 DIAGNOSIS — D509 Iron deficiency anemia, unspecified: Secondary | ICD-10-CM

## 2016-05-10 DIAGNOSIS — Z85118 Personal history of other malignant neoplasm of bronchus and lung: Secondary | ICD-10-CM | POA: Diagnosis not present

## 2016-05-10 DIAGNOSIS — I4891 Unspecified atrial fibrillation: Secondary | ICD-10-CM | POA: Diagnosis not present

## 2016-05-10 DIAGNOSIS — C241 Malignant neoplasm of ampulla of Vater: Secondary | ICD-10-CM

## 2016-05-10 DIAGNOSIS — D500 Iron deficiency anemia secondary to blood loss (chronic): Secondary | ICD-10-CM

## 2016-05-10 DIAGNOSIS — D508 Other iron deficiency anemias: Secondary | ICD-10-CM

## 2016-05-10 DIAGNOSIS — C3431 Malignant neoplasm of lower lobe, right bronchus or lung: Secondary | ICD-10-CM

## 2016-05-10 LAB — CBC WITH DIFFERENTIAL (CANCER CENTER ONLY)
BASO#: 0 10*3/uL (ref 0.0–0.2)
BASO%: 0.5 % (ref 0.0–2.0)
EOS ABS: 0.4 10*3/uL (ref 0.0–0.5)
EOS%: 6.6 % (ref 0.0–7.0)
HCT: 38.4 % — ABNORMAL LOW (ref 38.7–49.9)
HGB: 12.4 g/dL — ABNORMAL LOW (ref 13.0–17.1)
LYMPH#: 0.8 10*3/uL — ABNORMAL LOW (ref 0.9–3.3)
LYMPH%: 12.9 % — ABNORMAL LOW (ref 14.0–48.0)
MCH: 29.5 pg (ref 28.0–33.4)
MCHC: 32.3 g/dL (ref 32.0–35.9)
MCV: 91 fL (ref 82–98)
MONO#: 0.5 10*3/uL (ref 0.1–0.9)
MONO%: 8.9 % (ref 0.0–13.0)
NEUT%: 71.1 % (ref 40.0–80.0)
NEUTROS ABS: 4.3 10*3/uL (ref 1.5–6.5)
Platelets: 182 10*3/uL (ref 145–400)
RBC: 4.2 10*6/uL (ref 4.20–5.70)
RDW: 16.9 % — ABNORMAL HIGH (ref 11.1–15.7)
WBC: 6.1 10*3/uL (ref 4.0–10.0)

## 2016-05-10 LAB — COMPREHENSIVE METABOLIC PANEL
ALBUMIN: 3.7 g/dL (ref 3.5–5.0)
ALT: 11 U/L (ref 0–55)
AST: 14 U/L (ref 5–34)
Alkaline Phosphatase: 79 U/L (ref 40–150)
Anion Gap: 7 mEq/L (ref 3–11)
BUN: 17.6 mg/dL (ref 7.0–26.0)
CHLORIDE: 104 meq/L (ref 98–109)
CO2: 30 meq/L — AB (ref 22–29)
Calcium: 9.8 mg/dL (ref 8.4–10.4)
Creatinine: 1.1 mg/dL (ref 0.7–1.3)
EGFR: 64 mL/min/{1.73_m2} — ABNORMAL LOW (ref >90)
GLUCOSE: 173 mg/dL — AB (ref 70–140)
POTASSIUM: 4.4 meq/L (ref 3.5–5.1)
SODIUM: 141 meq/L (ref 136–145)
Total Bilirubin: 0.53 mg/dL (ref 0.20–1.20)
Total Protein: 7.4 g/dL (ref 6.4–8.3)

## 2016-05-10 LAB — IRON AND TIBC
%SAT: 25 % (ref 20–55)
Iron: 60 ug/dL (ref 42–163)
TIBC: 239 ug/dL (ref 202–409)
UIBC: 179 ug/dL (ref 117–376)

## 2016-05-10 LAB — FERRITIN: Ferritin: 192 ng/ml (ref 22–316)

## 2016-05-10 LAB — CHCC SATELLITE - SMEAR

## 2016-05-10 NOTE — Progress Notes (Signed)
Hematology and Oncology Follow Up Visit  CHASIN FINDLING [id number] 11-21-1933 83 y.o. 05/10/2016   Principle Diagnosis:  Stage Ia adenocarcinoma of the left lung-resected. 2. 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 3. Recurrent iron-deficiency anemia. 4. Hypotestosteronemia.  Current Therapy:   IV iron as indicated.     Interim History:  Mr.  Pitstick is back for followup. He is doing okay. We last saw him back in February. He did have iron deficiency again.  His iron saturation was only 9%. His ferritin was 42. We did give him a dose of IV iron. This made him feel a little bit better.  He does have some shortness of breath. He does have atrial fibrillation. He is on medication for this. He is on Coumadin. He's had no bleeding.  He has had no change in bowel or bladder habits. He has noted a "lump" in the suprapubic region.  He has had no fever. He has had no leg swelling.  He has been staying active with his wife. He and his wife lentigo on walks in the neighborhood when it is warm.  His last CA 19-9 was elevated at 671. This is been going up gradually. Despite our efforts, we have not found any obvious malignancy that would account for this.  Overall, his performance status is ECOG 1.    Medications:  Current Outpatient Prescriptions:  .  atorvastatin (LIPITOR) 10 MG tablet, Take 10 mg by mouth 3 (three) times a week. , Disp: , Rfl:  .  diltiazem (DILACOR XR) 240 MG 24 hr capsule, Take 240 mg by mouth daily., Disp: , Rfl:  .  glimepiride (AMARYL) 2 MG tablet, Take 2 mg by mouth daily before breakfast., Disp: , Rfl:  .  levothyroxine (SYNTHROID, LEVOTHROID) 100 MCG tablet, Take 100 mcg by mouth daily., Disp: , Rfl:  .  metFORMIN (GLUCOPHAGE) 500 MG tablet, Take 500 mg by mouth 2 (two) times daily with a meal. , Disp: , Rfl:  .  warfarin (COUMADIN) 2.5 MG tablet, Take 2.5 mg by mouth daily., Disp: , Rfl:  .  warfarin (COUMADIN) 5 MG tablet, Take 5 mg by  mouth daily., Disp: , Rfl:  .  zolpidem (AMBIEN) 10 MG tablet, Take 5 mg by mouth as needed. , Disp: , Rfl:  .  ACCU-CHEK AVIVA PLUS test strip, , Disp: , Rfl:   Allergies:  Allergies  Allergen Reactions  . Promethazine Other (See Comments)    Makes hyper    Past Medical History, Surgical history, Social history, and Family History were reviewed and updated.  Review of Systems: As above  Physical Exam:  weight is 150 lb (68 kg). His oral temperature is 97.6 F (36.4 C). His blood pressure is 141/101 (abnormal) and his pulse is 100. His respiration is 17 and oxygen saturation is 98%.   Well-developed and well-nourished gentleman. He has no ocular or oral lesions. There is no adenopathy in the neck. Lungs are clear on the right side. He has good breath sounds on the left side.. Cardiac exam irregular rate and rhythm consistent with atrial fibrillation. He has no murmurs, rubs or bruits. Abdomen is soft. He has a well-healed laparotomy scar. He has no fluid wave. He has no palpable abdominal mass. There is no palpable liver or spleen. Genital exam does show a large sebaceous cyst in the suprapubic region. It is firm. It is nontender. Back exam shows no tenderness over the spine, ribs or hips. Extremities shows  no clubbing cyanosis or edema. Has good range of motion of his joints. Skin exam no rashes. Neurological exam is nonfocal.  Lab Results  Component Value Date   WBC 6.1 05/10/2016   HGB 12.4 (L) 05/10/2016   HCT 38.4 (L) 05/10/2016   MCV 91 05/10/2016   PLT 182 05/10/2016     Chemistry      Component Value Date/Time   NA 134 03/29/2016 1321   NA 139 08/31/2015 0957   NA 140 06/01/2015 1135   K 4.0 03/29/2016 1321   K 4.4 08/31/2015 0957   K 4.4 06/01/2015 1135   CL 99 03/29/2016 1321   CL 102 08/31/2015 0957   CO2 30 (H) 03/29/2016 1321   CO2 33 08/31/2015 0957   CO2 31 (H) 06/01/2015 1135   BUN 15 03/29/2016 1321   BUN 18 08/31/2015 0957   BUN 21.3 06/01/2015 1135    CREATININE 0.90 03/29/2016 1321   CREATININE 1.0 08/31/2015 0957   CREATININE 1.2 06/01/2015 1135      Component Value Date/Time   CALCIUM 9.7 03/29/2016 1321   CALCIUM 8.7 08/31/2015 0957   CALCIUM 9.6 06/01/2015 1135   ALKPHOS 60 03/29/2016 1321   ALKPHOS 53 08/31/2015 0957   ALKPHOS 57 06/01/2015 1135   AST 13 03/29/2016 1321   AST 25 08/31/2015 0957   AST 17 06/01/2015 1135   ALT 11 03/29/2016 1321   ALT 25 08/31/2015 0957   ALT 14 06/01/2015 1135   BILITOT 0.6 03/29/2016 1321   BILITOT 0.90 08/31/2015 0957   BILITOT 0.64 06/01/2015 1135         Impression and Plan: Mr. Hoar is 83 year old gentleman. He had a history of stage III adenocarcinoma of the pancreas. This was probably 10 years ago. He then had a stage I lung cancer resected in January of 2010.  He has responded very well to the IV iron. His hemoglobin is come back up quite well. We will see what his iron studies show.  His CA 19-9 has been quite elevated. Despite our best efforts, we have not been able to identify any recurrent cancer.  He does have a fairly large sebaceous cyst in the suprapubic area. I think despite needs to be addressed by surgery. We will see if Dr. Excell Seltzer can see him again.   As always, he looks great. He feels good. As long as he is doing this well, I just don't see that we have to get very aggressive with trying to uncover any occult malignancy.   I will plan to see him back in 2 months.    [name], MD 4/3/201811:43 AM

## 2016-05-11 LAB — CANCER ANTIGEN 19-9: CA 19-9: 933 U/mL — ABNORMAL HIGH (ref 0–35)

## 2016-05-23 ENCOUNTER — Ambulatory Visit (INDEPENDENT_AMBULATORY_CARE_PROVIDER_SITE_OTHER): Payer: Medicare Other | Admitting: Pharmacist

## 2016-05-23 DIAGNOSIS — Z7901 Long term (current) use of anticoagulants: Secondary | ICD-10-CM

## 2016-05-23 DIAGNOSIS — I4891 Unspecified atrial fibrillation: Secondary | ICD-10-CM | POA: Diagnosis not present

## 2016-05-23 DIAGNOSIS — I482 Chronic atrial fibrillation, unspecified: Secondary | ICD-10-CM

## 2016-05-23 LAB — POCT INR: INR: 2.3

## 2016-06-09 ENCOUNTER — Telehealth: Payer: Self-pay | Admitting: Internal Medicine

## 2016-06-09 NOTE — Telephone Encounter (Signed)
Ok to hold warfarin 5 days prior to procedure and then restart the evening after the procedure. Low risk for surgery.  Dr. Lemmie Evens

## 2016-06-09 NOTE — Telephone Encounter (Signed)
[facility] requests clearance for: 1. Type of surgery: excision of sebaceous cyst under local anesthesia  2. Date of surgery: TBD 3. Surgeon: Dr. Suezanne Jacquet Hoxworth 4. Medications that need to be held & how long: warfarin 5. Fax and/or Phone: (p) [phone]  (f) [phone]  -- Attn: Mammie Lorenzo, LPN

## 2016-06-09 NOTE — Telephone Encounter (Signed)
Clearance sent via EPIC 

## 2016-06-20 ENCOUNTER — Ambulatory Visit (INDEPENDENT_AMBULATORY_CARE_PROVIDER_SITE_OTHER): Payer: Medicare Other | Admitting: Pharmacist Clinician (PhC)/ Clinical Pharmacy Specialist

## 2016-06-20 DIAGNOSIS — Z7901 Long term (current) use of anticoagulants: Secondary | ICD-10-CM

## 2016-06-20 DIAGNOSIS — I4891 Unspecified atrial fibrillation: Secondary | ICD-10-CM

## 2016-06-20 DIAGNOSIS — I482 Chronic atrial fibrillation, unspecified: Secondary | ICD-10-CM

## 2016-06-20 LAB — POCT INR: INR: 2.8

## 2016-06-27 ENCOUNTER — Encounter (HOSPITAL_COMMUNITY): Payer: Self-pay

## 2016-06-27 ENCOUNTER — Ambulatory Visit (INDEPENDENT_AMBULATORY_CARE_PROVIDER_SITE_OTHER): Payer: Medicare Other | Admitting: Urgent Care

## 2016-06-27 ENCOUNTER — Emergency Department (HOSPITAL_COMMUNITY)
Admission: EM | Admit: 2016-06-27 | Discharge: 2016-06-27 | Disposition: A | Discharge status: Home / Self Care | Payer: Medicare Other | Attending: Emergency Medicine | Admitting: Emergency Medicine

## 2016-06-27 ENCOUNTER — Encounter: Payer: Self-pay | Admitting: Urgent Care

## 2016-06-27 ENCOUNTER — Emergency Department (HOSPITAL_COMMUNITY): Payer: Medicare Other

## 2016-06-27 VITALS — BP 180/109 | HR 126 | Temp 98.8°F | Resp 16 | Wt 148.6 lb

## 2016-06-27 DIAGNOSIS — W19XXXA Unspecified fall, initial encounter: Secondary | ICD-10-CM | POA: Diagnosis not present

## 2016-06-27 DIAGNOSIS — I10 Essential (primary) hypertension: Secondary | ICD-10-CM

## 2016-06-27 DIAGNOSIS — Z79899 Other long term (current) drug therapy: Secondary | ICD-10-CM | POA: Insufficient documentation

## 2016-06-27 DIAGNOSIS — Z7984 Long term (current) use of oral hypoglycemic drugs: Secondary | ICD-10-CM | POA: Insufficient documentation

## 2016-06-27 DIAGNOSIS — Z85118 Personal history of other malignant neoplasm of bronchus and lung: Secondary | ICD-10-CM | POA: Diagnosis not present

## 2016-06-27 DIAGNOSIS — Y92480 Sidewalk as the place of occurrence of the external cause: Secondary | ICD-10-CM | POA: Diagnosis not present

## 2016-06-27 DIAGNOSIS — Y9301 Activity, walking, marching and hiking: Secondary | ICD-10-CM | POA: Insufficient documentation

## 2016-06-27 DIAGNOSIS — Z8507 Personal history of malignant neoplasm of pancreas: Secondary | ICD-10-CM | POA: Diagnosis not present

## 2016-06-27 DIAGNOSIS — S7001XA Contusion of right hip, initial encounter: Secondary | ICD-10-CM

## 2016-06-27 DIAGNOSIS — Z85068 Personal history of other malignant neoplasm of small intestine: Secondary | ICD-10-CM | POA: Insufficient documentation

## 2016-06-27 DIAGNOSIS — Z7901 Long term (current) use of anticoagulants: Secondary | ICD-10-CM | POA: Diagnosis not present

## 2016-06-27 DIAGNOSIS — I4891 Unspecified atrial fibrillation: Secondary | ICD-10-CM | POA: Diagnosis not present

## 2016-06-27 DIAGNOSIS — W010XXA Fall on same level from slipping, tripping and stumbling without subsequent striking against object, initial encounter: Secondary | ICD-10-CM | POA: Diagnosis not present

## 2016-06-27 DIAGNOSIS — Y999 Unspecified external cause status: Secondary | ICD-10-CM | POA: Insufficient documentation

## 2016-06-27 DIAGNOSIS — Z87891 Personal history of nicotine dependence: Secondary | ICD-10-CM | POA: Insufficient documentation

## 2016-06-27 DIAGNOSIS — S79911A Unspecified injury of right hip, initial encounter: Secondary | ICD-10-CM | POA: Diagnosis present

## 2016-06-27 DIAGNOSIS — E119 Type 2 diabetes mellitus without complications: Secondary | ICD-10-CM | POA: Insufficient documentation

## 2016-06-27 DIAGNOSIS — R030 Elevated blood-pressure reading, without diagnosis of hypertension: Secondary | ICD-10-CM

## 2016-06-27 LAB — COMPREHENSIVE METABOLIC PANEL
ALK PHOS: 55 U/L (ref 38–126)
ALT: 17 U/L (ref 17–63)
AST: 22 U/L (ref 15–41)
Albumin: 4 g/dL (ref 3.5–5.0)
Anion gap: 8 (ref 5–15)
BILIRUBIN TOTAL: 0.9 mg/dL (ref 0.3–1.2)
BUN: 20 mg/dL (ref 6–20)
CO2: 28 mmol/L (ref 22–32)
CREATININE: 1.02 mg/dL (ref 0.61–1.24)
Calcium: 9.3 mg/dL (ref 8.9–10.3)
Chloride: 102 mmol/L (ref 101–111)
Glucose, Bld: 170 mg/dL — ABNORMAL HIGH (ref 65–99)
Potassium: 3.9 mmol/L (ref 3.5–5.1)
Sodium: 138 mmol/L (ref 135–145)
Total Protein: 6.8 g/dL (ref 6.5–8.1)

## 2016-06-27 LAB — CBC WITH DIFFERENTIAL/PLATELET
BASOS PCT: 1 %
Basophils Absolute: 0 10*3/uL (ref 0.0–0.1)
EOS ABS: 0.3 10*3/uL (ref 0.0–0.7)
EOS PCT: 4 %
HCT: 37.8 % — ABNORMAL LOW (ref 39.0–52.0)
HEMOGLOBIN: 12.1 g/dL — AB (ref 13.0–17.0)
Lymphocytes Relative: 19 %
Lymphs Abs: 1.3 10*3/uL (ref 0.7–4.0)
MCH: 29.1 pg (ref 26.0–34.0)
MCHC: 32 g/dL (ref 30.0–36.0)
MCV: 90.9 fL (ref 78.0–100.0)
Monocytes Absolute: 0.5 10*3/uL (ref 0.1–1.0)
Monocytes Relative: 8 %
NEUTROS PCT: 68 %
Neutro Abs: 4.5 10*3/uL (ref 1.7–7.7)
PLATELETS: 133 10*3/uL — AB (ref 150–400)
RBC: 4.16 MIL/uL — AB (ref 4.22–5.81)
RDW: 16.1 % — ABNORMAL HIGH (ref 11.5–15.5)
WBC: 6.6 10*3/uL (ref 4.0–10.5)

## 2016-06-27 LAB — URINALYSIS, ROUTINE W REFLEX MICROSCOPIC
Bacteria, UA: NONE SEEN
Bilirubin Urine: NEGATIVE
Glucose, UA: NEGATIVE mg/dL
Ketones, ur: NEGATIVE mg/dL
Leukocytes, UA: NEGATIVE
Nitrite: NEGATIVE
PH: 5 (ref 5.0–8.0)
Protein, ur: 30 mg/dL — AB
SPECIFIC GRAVITY, URINE: 1.02 (ref 1.005–1.030)

## 2016-06-27 LAB — PROTIME-INR
INR: 2.51
Prothrombin Time: 27.5 seconds — ABNORMAL HIGH (ref 11.4–15.2)

## 2016-06-27 MED ORDER — DILTIAZEM HCL ER COATED BEADS 240 MG PO CP24
240.0000 mg | ORAL_CAPSULE | Freq: Once | ORAL | Status: AC
  Administered 2016-06-27: 240 mg via ORAL
  Filled 2016-06-27: qty 1

## 2016-06-27 NOTE — ED Triage Notes (Signed)
Pt brought in by EMS from [facility] due to falling today. Per EMS pt was walking and tripped over sidewalk and fell on right hip. Pt has hematoma and right hip. Pt denies hitting head or LOC. Pt has hx of a.fib and takes coumadin. Pt a&ox4.

## 2016-06-27 NOTE — Discharge Instructions (Signed)
No signs of fracture or other significant abnormality on x-ray. No significant or acute abnormalities on lab work.  Apply pressure and ice to reduce swelling. We would expect the swelling on the hip to subside over time. Follow-up with your primary care provider on this matter. Continue to take all medications as prescribed.

## 2016-06-27 NOTE — Patient Instructions (Signed)
     IF you received an x-ray today, you will receive an invoice from [facility]. Please contact [facility] at [phone] with questions or concerns regarding your invoice.   IF you received labwork today, you will receive an invoice from [facility]. Please contact [facility] at [phone] with questions or concerns regarding your invoice.   Our billing staff will not be able to assist you with questions regarding bills from these companies.  You will be contacted with the lab results as soon as they are available. The fastest way to get your results is to activate your My Chart account. Instructions are located on the last page of this paperwork. If you have not heard from us regarding the results in 2 weeks, please contact this office.     

## 2016-06-27 NOTE — Progress Notes (Signed)
  MRN: [id number] DOB: August 26, 1933  Subjective:   [name] is a 83 y.o. male presenting for chief complaint of Hip Injury (right hip, fell while walking this morning/tripped  "knot on hip now")  Reports suffering a fall by tripping over a curb today. Landed on his right hip with out-stretched right hand. He was able to get on his feet with help and has been ambulating without pain. However, he subsequently noticed a large knot forming over his right hip and came in for an evaluation. Denies hip pain, headache, dizziness, chest pain, shob, heart racing, n/v, abdominal pain, diaphoresis. Denies head injury, blurred vision, speech difficulty, confusion. Has a history of atrial fibrillation, is taking diltiazem, coumadin.   Amere has a current medication list which includes the following prescription(s): accu-chek aviva plus, atorvastatin, diltiazem, glimepiride, levothyroxine, metformin, warfarin, warfarin, and zolpidem. Also is allergic to promethazine. Muhanad  has a past medical history of Cancer of ampulla of Vater ([place]) (01/27/2013); Diabetes mellitus; H/O echocardiogram (2003,2009,2010, 2012); Hypertension; Iron deficiency anemia, unspecified (01/27/2013); Lung cancer ([place]) (dx'd 02/2009); Lung cancer, lower lobe ([place]) (01/27/2013); and pancreatic ca (dx'd 2004). Also  has a past surgical history that includes Video bronchoscopy with endobronchial ultrasound (N/A, 10/20/2014).  Objective:   Vitals: BP (!) 180/109 (Cuff Size: Normal)   Pulse (!) 126   Temp 98.8 F (37.1 C) (Oral)   Resp 16   Wt 148 lb 9.6 oz (67.4 kg)   SpO2 97%   BMI 23.27 kg/m   Physical Exam  Constitutional: He is oriented to person, place, and time. He appears well-developed and well-nourished.  HENT:  Mouth/Throat: Oropharynx is clear and moist.  Eyes: EOM are normal. Pupils are equal, round, and reactive to light. No scleral icterus.  Neck: Normal range of motion. Neck supple.  Cardiovascular: Normal rate,  regular rhythm and intact distal pulses.  Exam reveals no gallop and no friction rub.   No murmur heard. Pulmonary/Chest: No respiratory distress. He has no wheezes. He has no rales.  Musculoskeletal:       Right hip: He exhibits swelling (hematoma >10cm in diameter of right lateral hip). He exhibits normal range of motion, normal strength, no tenderness, no bony tenderness, no crepitus and no laceration.       Legs: Neurological: He is alert and oriented to person, place, and time. No cranial nerve deficit. Coordination normal.  Skin: Skin is warm and dry.  Psychiatric: He has a normal mood and affect.   ECG interpretation - No obvious p-waves, concern for atrial fibrillation with RVR.  Assessment and Plan :   This case was precepted with Dr. Carlota Raspberry.   1. Elevated blood pressure reading 2. Essential hypertension 3. Atrial fibrillation, unspecified type ([place]) - Will send via EMS for emergent work up of atrial fibrillation. Patient refused IV in clinic.   4. Fall, initial encounter 5. Contusion of right hip, initial encounter 6. Hematoma of hip, right, initial encounter 7. On continuous oral anticoagulation - Patient will need imaging to r/o hip fracture. Counseled patient on diagnosis. Close monitoring of hematoma is also necessary in setting of chronic anticoagulation with coumadin.  Jaynee Eagles, PA-C [facility] [phone] 06/27/2016  11:01 AM

## 2016-06-27 NOTE — ED Provider Notes (Signed)
[facility] DEPT Provider Note   CSN: [id number] Arrival date & time: 06/27/16  1237     History   Chief Complaint Chief Complaint  Patient presents with  . Fall    HPI [name] is a 83 y.o. male.  HPI   [name] is a 83 y.o. male, with a history of HTN, anemia, DM, A. fib, and cancer, presenting to the ED with injuries from a mechanical fall.  Patient states he tripped and fell today while walking. He fell forward and landed on his right hip. States he got back up and continued his walked. Then noticed he has swelling to the right hip. Denies current pain.   Patient is on diltiazem and states he has not yet taken it today.   Denies CP, SOB, head injury, neck/back pain, dizziness/lightheadedness, neuro deficits, or any other complaints.    Past Medical History:  Diagnosis Date  . Cancer of ampulla of Vater ([place]) 01/27/2013  . Diabetes mellitus   . H/O echocardiogram 2003,2009,2010, 2012  . Hypertension   . Iron deficiency anemia, unspecified 01/27/2013  . Lung cancer ([place]) dx'd 02/2009   surg only  . Lung cancer, lower lobe ([place]) 01/27/2013  . pancreatic ca dx'd 2004   oral chemo/xrt comp    Patient Active Problem List   Diagnosis Date Noted  . Wheezing on auscultation 11/02/2015  . Mitral regurgitation 11/02/2015  . Murmur 09/24/2015  . Dizziness 09/24/2015  . Lung cancer, lower lobe ([place]) 01/27/2013  . Cancer of ampulla of Vater ([place]) 01/27/2013  . Iron deficiency anemia 01/27/2013  . Atrial fibrillation ([place]) 05/26/2012  . Long term current use of anticoagulant therapy 05/26/2012    Past Surgical History:  Procedure Laterality Date  . VIDEO BRONCHOSCOPY WITH ENDOBRONCHIAL ULTRASOUND N/A 10/20/2014   Procedure: VIDEO BRONCHOSCOPY WITH ENDOBRONCHIAL ULTRASOUND;  Surgeon: Melrose Nakayama, MD;  Location: [facility];  Service: Thoracic;  Laterality: N/A;       Home Medications    Prior to Admission medications   Medication Sig Start  Date End Date Taking? Authorizing Provider  atorvastatin (LIPITOR) 10 MG tablet Take 10 mg by mouth every Monday, Wednesday, and Friday.  09/22/11  Yes [provider]  diltiazem (DILACOR XR) 240 MG 24 hr capsule Take 240 mg by mouth daily.   Yes [provider]  glimepiride (AMARYL) 2 MG tablet Take 2 mg by mouth daily before breakfast.   Yes [provider]  levothyroxine (SYNTHROID, LEVOTHROID) 100 MCG tablet Take 100 mcg by mouth daily. 12/20/13  Yes [provider]  metFORMIN (GLUCOPHAGE) 500 MG tablet Take 500 mg by mouth 2 (two) times daily with a meal.    Yes [provider]  warfarin (COUMADIN) 2.5 MG tablet Take 2.5-5 mg by mouth daily. 2.'5mg'$  on M W F '5mg'$  on Tue, Thu, Sat, Sun   Yes [provider]  zolpidem (AMBIEN) 5 MG tablet Take 5 mg by mouth at bedtime as needed for sleep.  01/24/13  Yes [provider]    Family History Family History  Problem Relation Age of Onset  . Emphysema Father   . Lung cancer Brother     Social History Social History  Substance Use Topics  . Smoking status: Former Smoker    Packs/day: 2.00    Years: 7.00    Types: Cigarettes    Start date: 09/30/1950    Quit date: 04/29/1957  . Smokeless tobacco: Never Used     Comment: quit  54 years ago  . Alcohol use Not on file     Allergies   Promethazine   Review of Systems Review of Systems  Respiratory: Negative for cough and shortness of breath.   Cardiovascular: Negative for chest pain.  Gastrointestinal: Negative for abdominal pain, nausea and vomiting.  Musculoskeletal: Positive for joint swelling. Negative for back pain and neck pain.  Neurological: Negative for dizziness, syncope, weakness, light-headedness, numbness and headaches.  All other systems reviewed and are negative.    Physical Exam Updated Vital Signs Pulse (!) 114   Temp 97.7 F (36.5 C) (Oral)   Resp 13   Ht '5\' 7"'$  (1.702 m)   Wt 67.1 kg (148 lb)    SpO2 97%   BMI 23.18 kg/m   Physical Exam  Constitutional: He appears well-developed and well-nourished. No distress.  HENT:  Head: Normocephalic and atraumatic.  Mouth/Throat: Oropharynx is clear and moist.  Eyes: Conjunctivae and EOM are normal. Pupils are equal, round, and reactive to light.  Neck: Normal range of motion. Neck supple.  Cardiovascular: Normal rate, regular rhythm, normal heart sounds and intact distal pulses.   Pulmonary/Chest: Effort normal and breath sounds normal. No respiratory distress.  Abdominal: Soft. There is no tenderness. There is no guarding.  Musculoskeletal: Normal range of motion. He exhibits edema. He exhibits no deformity.  Large area of swelling and bruising to lateral right hip about the size of a softball. Full ROM in right hip and knee. Normal motor function intact in all other extremities and spine. No midline spinal tenderness.   Small amount of bruising noted to the hyperthenar eminence of the right hand. Full range of motion in the right hand and wrist. No tenderness, swelling, or deformity noted.   Lymphadenopathy:    He has no cervical adenopathy.  Neurological: He is alert.  No sensory deficits. Strength 5/5 in all extremities. No gait disturbance. Coordination intact including heel to shin and finger to nose. Cranial nerves III-XII grossly intact. No facial droop.  Skin: Skin is warm and dry. He is not diaphoretic.  Psychiatric: He has a normal mood and affect. His behavior is normal.  Nursing note and vitals reviewed.    ED Treatments / Results  Labs (all labs ordered are listed, but only abnormal results are displayed) Labs Reviewed  COMPREHENSIVE METABOLIC PANEL - Abnormal; Notable for the following:       Result Value   Glucose, Bld 170 (*)    All other components within normal limits  CBC WITH DIFFERENTIAL/PLATELET - Abnormal; Notable for the following:    RBC 4.16 (*)    Hemoglobin 12.1 (*)    HCT 37.8 (*)    RDW 16.1 (*)     Platelets 133 (*)    All other components within normal limits  PROTIME-INR - Abnormal; Notable for the following:    Prothrombin Time 27.5 (*)    All other components within normal limits  URINALYSIS, ROUTINE W REFLEX MICROSCOPIC - Abnormal; Notable for the following:    Hgb urine dipstick SMALL (*)    Protein, ur 30 (*)    Squamous Epithelial / LPF 0-5 (*)    All other components within normal limits    EKG  EKG Interpretation None       Radiology Dg Hip Unilat W Or Wo Pelvis 2-3 Views Right  Result Date: 06/27/2016 CLINICAL DATA:  Fall this morning with right hip pain, initial encounter EXAM: DG HIP (WITH OR WITHOUT PELVIS) 3V RIGHT COMPARISON:  None. FINDINGS: Pelvic ring is intact. No acute fracture or dislocation is noted. Degenerative changes of the hip joints are noted bilaterally. No lytic or sclerotic lesion is noted. Stable sclerotic lesion is noted within the sacrum on the right. IMPRESSION: No acute abnormality noted. Mild degenerative changes of the hip joints are seen. Electronically Signed   By: Inez Catalina M.D.   On: 06/27/2016 14:35    Procedures Procedures (including critical care time)  Medications Ordered in ED Medications  diltiazem (CARDIZEM CD) 24 hr capsule 240 mg (240 mg Oral Given 06/27/16 1349)     Initial Impression / Assessment and Plan / ED Course  I have reviewed the triage vital signs and the nursing notes.  Pertinent labs & imaging results that were available during my care of the patient were reviewed by me and considered in my medical decision making (see chart for details).  Clinical Course as of Jun 28 1522  Mon Jun 27, 2016  1501 Patient continues to deny pain or other complaints.   [SJ]    Clinical Course User Index [SJ] Lodema Parma C, PA-C    Patient presents for evaluation following a fall. No acute osseous abnormality on x-ray. Ambulates without difficulty. PCP follow-up. The patient was given instructions for home care as  well as return precautions. Patient voices understanding of these instructions, accepts the plan, and is comfortable with discharge.   Findings and plan of care discussed with Gareth Morgan, MD. Dr. Billy Fischer personally evaluated and examined this patient.  Vitals:   06/27/16 1249 06/27/16 1310 06/27/16 1315 06/27/16 1415  BP:  (!) 160/110 (!) 163/126 (!) 168/121  Pulse: (!) 114  (!) 118 (!) 110  Resp: 13  (!) 26 14  Temp: 97.7 F (36.5 C)     TempSrc: Oral     SpO2: 97%  95% 97%  Weight: 67.1 kg (148 lb)     Height: '5\' 7"'$  (1.702 m)      Patient's pulse rate and blood pressure noted. Suspect this is due to the patient not having taken his diltiazem this morning.  Final Clinical Impressions(s) / ED Diagnoses   Final diagnoses:  Fall, initial encounter  Contusion of right hip, initial encounter    New Prescriptions New Prescriptions   No medications on file     Layla Maw 06/27/16 [place], MD 06/28/16 [phone]

## 2016-06-27 NOTE — Progress Notes (Signed)
Patient discussed with and seen with Jaynee Eagles, PA-C. Appears to be accidental fall from tripping, denies syncope. Suspected large hematoma lateral R hip with chronic anticoagulation use., minimal discomfort with external hip rotation, less likely occult fracture. He is significantly  tachycardic, elevated pressure off of medications this morning. EKG with tachycardia, suspected A. fib with RVR as unable to discretely identify P waves, and appears to have some ST depression not seen on previous EKG. Possible strain with tachycardia. Denies chest pain. Pressure elevated, not low.  Agree with further evaluation in emergency room and EMS transport

## 2016-07-11 ENCOUNTER — Ambulatory Visit (HOSPITAL_BASED_OUTPATIENT_CLINIC_OR_DEPARTMENT_OTHER): Payer: Medicare Other | Admitting: Hematology & Oncology

## 2016-07-11 ENCOUNTER — Other Ambulatory Visit (HOSPITAL_BASED_OUTPATIENT_CLINIC_OR_DEPARTMENT_OTHER): Payer: Medicare Other

## 2016-07-11 VITALS — BP 144/89 | HR 88 | Temp 98.4°F | Resp 16 | Wt 149.5 lb

## 2016-07-11 DIAGNOSIS — Z85118 Personal history of other malignant neoplasm of bronchus and lung: Secondary | ICD-10-CM | POA: Diagnosis not present

## 2016-07-11 DIAGNOSIS — C241 Malignant neoplasm of ampulla of Vater: Secondary | ICD-10-CM

## 2016-07-11 DIAGNOSIS — C3430 Malignant neoplasm of lower lobe, unspecified bronchus or lung: Secondary | ICD-10-CM

## 2016-07-11 DIAGNOSIS — D509 Iron deficiency anemia, unspecified: Secondary | ICD-10-CM

## 2016-07-11 DIAGNOSIS — D500 Iron deficiency anemia secondary to blood loss (chronic): Secondary | ICD-10-CM

## 2016-07-11 DIAGNOSIS — C3431 Malignant neoplasm of lower lobe, right bronchus or lung: Secondary | ICD-10-CM

## 2016-07-11 DIAGNOSIS — Z8507 Personal history of malignant neoplasm of pancreas: Secondary | ICD-10-CM

## 2016-07-11 LAB — CBC WITH DIFFERENTIAL (CANCER CENTER ONLY)
BASO#: 0 10*3/uL (ref 0.0–0.2)
BASO%: 0.5 % (ref 0.0–2.0)
EOS ABS: 0.4 10*3/uL (ref 0.0–0.5)
EOS%: 5.4 % (ref 0.0–7.0)
HCT: 37.7 % — ABNORMAL LOW (ref 38.7–49.9)
HEMOGLOBIN: 12.3 g/dL — AB (ref 13.0–17.1)
LYMPH#: 0.9 10*3/uL (ref 0.9–3.3)
LYMPH%: 14.1 % (ref 14.0–48.0)
MCH: 30.3 pg (ref 28.0–33.4)
MCHC: 32.6 g/dL (ref 32.0–35.9)
MCV: 93 fL (ref 82–98)
MONO#: 0.8 10*3/uL (ref 0.1–0.9)
MONO%: 12 % (ref 0.0–13.0)
NEUT%: 68 % (ref 40.0–80.0)
NEUTROS ABS: 4.5 10*3/uL (ref 1.5–6.5)
Platelets: 131 10*3/uL — ABNORMAL LOW (ref 145–400)
RBC: 4.06 10*6/uL — AB (ref 4.20–5.70)
RDW: 16 % — ABNORMAL HIGH (ref 11.1–15.7)
WBC: 6.6 10*3/uL (ref 4.0–10.0)

## 2016-07-11 LAB — IRON AND TIBC
%SAT: 25 % (ref 20–55)
IRON: 67 ug/dL (ref 42–163)
TIBC: 271 ug/dL (ref 202–409)
UIBC: 205 ug/dL (ref 117–376)

## 2016-07-11 LAB — CMP (CANCER CENTER ONLY)
ALBUMIN: 3.8 g/dL (ref 3.3–5.5)
ALT(SGPT): 23 U/L (ref 10–47)
AST: 24 U/L (ref 11–38)
Alkaline Phosphatase: 58 U/L (ref 26–84)
BILIRUBIN TOTAL: 1.1 mg/dL (ref 0.20–1.60)
BUN, Bld: 17 mg/dL (ref 7–22)
CHLORIDE: 102 meq/L (ref 98–108)
CO2: 30 mEq/L (ref 18–33)
CREATININE: 1.1 mg/dL (ref 0.6–1.2)
Calcium: 9.5 mg/dL (ref 8.0–10.3)
Glucose, Bld: 226 mg/dL — ABNORMAL HIGH (ref 73–118)
Potassium: 3.8 mEq/L (ref 3.3–4.7)
SODIUM: 138 meq/L (ref 128–145)
TOTAL PROTEIN: 7.1 g/dL (ref 6.4–8.1)

## 2016-07-11 LAB — FERRITIN: FERRITIN: 133 ng/mL (ref 22–316)

## 2016-07-11 NOTE — Progress Notes (Signed)
Hematology and Oncology Follow Up Visit  [name] [id number] 1933-10-02 83 y.o. 07/11/2016   Principle Diagnosis:  Stage Ia adenocarcinoma of the left lung-resected. 2. 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 3. Recurrent iron-deficiency anemia. 4. Hypotestosteronemia.  Current Therapy:   IV iron as indicated.     Interim History:  Mr.  [name] is back for followup. He is doing okay. He apparently fell while walking almost 2 weeks go. He has a large bruise in the back of his right buttock. He said this went down his right leg. He went to the emergency room. His INR was okay. He was barely anemic.  He stopped taking his Coumadin. I told he had skin back onto the Coumadin.  He's had no cough. His appetite has been good. His blood sugars are up today. He does have diabetes.  He's had no change in bowel or bladder habits.  His CA 19-9 has been going up. We last saw him it was over 900. I'm not sure why this is so elevated. We have referred him back to his thoracic surgeon. He is not B Elder do find anything.  He's had no rashes. He's had no weight loss. His weight is holding steady.  Overall, his performance status is ECOG 1.    Medications:  Current Outpatient Prescriptions:  .  atorvastatin (LIPITOR) 10 MG tablet, Take 10 mg by mouth every Monday, Wednesday, and Friday. , Disp: , Rfl:  .  diltiazem (DILACOR XR) 240 MG 24 hr capsule, Take 240 mg by mouth daily., Disp: , Rfl:  .  glimepiride (AMARYL) 2 MG tablet, Take 2 mg by mouth daily before breakfast., Disp: , Rfl:  .  levothyroxine (SYNTHROID, LEVOTHROID) 100 MCG tablet, Take 100 mcg by mouth daily., Disp: , Rfl:  .  metFORMIN (GLUCOPHAGE) 500 MG tablet, Take 500 mg by mouth 2 (two) times daily with a meal. , Disp: , Rfl:  .  warfarin (COUMADIN) 2.5 MG tablet, Take 2.5-5 mg by mouth daily. 2.5mg  on M W F 5mg  on Tue, Thu, Sat, Sun, Disp: , Rfl:  .  zolpidem (AMBIEN) 5 MG tablet, Take 5 mg by mouth at  bedtime as needed for sleep. , Disp: , Rfl:   Allergies:  Allergies  Allergen Reactions  . Promethazine Other (See Comments)    Makes hyper    Past Medical History, Surgical history, Social history, and Family History were reviewed and updated.  Review of Systems: As above  Physical Exam:  weight is 149 lb 8 oz (67.8 kg). His oral temperature is 98.4 F (36.9 C). His blood pressure is 144/89 (abnormal) and his pulse is 88. His respiration is 16 and oxygen saturation is 97%.   Well-developed and well-nourished gentleman. He has no ocular or oral lesions. There is no adenopathy in the neck. Lungs are clear on the right side. He has good breath sounds on the left side.. Cardiac exam irregular rate and rhythm consistent with atrial fibrillation. He has no murmurs, rubs or bruits. Abdomen is soft. He has a well-healed laparotomy scar. He has no fluid wave. He has no palpable abdominal mass. There is no palpable liver or spleen. Genital exam does show a large sebaceous cyst in the suprapubic region. It is firm. It is nontender. Back exam shows no tenderness over the spine, ribs or hips. Extremities shows no clubbing cyanosis or edema. Has good range of motion of his joints. Skin exam no rashes. Neurological exam is nonfocal.  Lab Results  Component Value Date   WBC 6.6 07/11/2016   HGB 12.3 (L) 07/11/2016   HCT 37.7 (L) 07/11/2016   MCV 93 07/11/2016   PLT 131 (L) 07/11/2016     Chemistry      Component Value Date/Time   NA 138 07/11/2016 1050   NA 141 05/10/2016 1101   K 3.8 07/11/2016 1050   K 4.4 05/10/2016 1101   CL 102 07/11/2016 1050   CO2 30 07/11/2016 1050   CO2 30 (H) 05/10/2016 1101   BUN 17 07/11/2016 1050   BUN 17.6 05/10/2016 1101   CREATININE 1.1 07/11/2016 1050   CREATININE 1.1 05/10/2016 1101      Component Value Date/Time   CALCIUM 9.5 07/11/2016 1050   CALCIUM 9.8 05/10/2016 1101   ALKPHOS 58 07/11/2016 1050   ALKPHOS 79 05/10/2016 1101   AST 24 07/11/2016  1050   AST 14 05/10/2016 1101   ALT 23 07/11/2016 1050   ALT 11 05/10/2016 1101   BILITOT 1.10 07/11/2016 1050   BILITOT 0.53 05/10/2016 1101         Impression and Plan: [name] is 83 year old gentleman. He had a history of stage III adenocarcinoma of the pancreas. This was probably 10 years ago. He then had a stage I lung cancer resected in January of 2010.  I still and not sure why his CA 19-9 keeps going up. We have not yet found any obvious recurrence of malignancy. However, I have to believe that something is going on.  We will go ahead and repeat the PET scan. We will see what this shows. I would be worried more about the lung cancer recurrent even though it is now 8 years. I would have a very hard time believing that the of pancreatic cancer would be coming back.  For now, we will just see what all the labs and PET scan shows. I'll see what his iron levels are.  I told him he [place] back onto his Coumadin. He has atrial fibrillation. He's been off Coumadin for a couple days. His INR was perfect when he was checked in the emergency room after his fall.      [name], MD 6/4/201811:57 AM

## 2016-07-12 ENCOUNTER — Telehealth: Payer: Self-pay | Admitting: *Deleted

## 2016-07-12 LAB — CANCER ANTIGEN 19-9: CAN 19-9: 835 U/mL — AB (ref 0–35)

## 2016-07-12 NOTE — Telephone Encounter (Addendum)
Patient aware of results   ----- Message from [name], MD sent at 07/11/2016  5:51 PM EDT ----- Call - iron level is ok!!! [name]

## 2016-08-01 ENCOUNTER — Ambulatory Visit (INDEPENDENT_AMBULATORY_CARE_PROVIDER_SITE_OTHER): Payer: Medicare Other | Admitting: Pharmacist Clinician (PhC)/ Clinical Pharmacy Specialist

## 2016-08-01 DIAGNOSIS — Z7901 Long term (current) use of anticoagulants: Secondary | ICD-10-CM

## 2016-08-01 DIAGNOSIS — I4891 Unspecified atrial fibrillation: Secondary | ICD-10-CM

## 2016-08-01 DIAGNOSIS — I482 Chronic atrial fibrillation, unspecified: Secondary | ICD-10-CM

## 2016-08-01 LAB — POCT INR: INR: 2.2

## 2016-08-19 ENCOUNTER — Ambulatory Visit (INDEPENDENT_AMBULATORY_CARE_PROVIDER_SITE_OTHER): Payer: Medicare Other | Admitting: Pharmacist

## 2016-08-19 DIAGNOSIS — I482 Chronic atrial fibrillation, unspecified: Secondary | ICD-10-CM

## 2016-08-19 DIAGNOSIS — I4891 Unspecified atrial fibrillation: Secondary | ICD-10-CM

## 2016-08-19 DIAGNOSIS — Z7901 Long term (current) use of anticoagulants: Secondary | ICD-10-CM

## 2016-08-19 LAB — POCT INR: INR: 2.1

## 2016-08-31 ENCOUNTER — Other Ambulatory Visit: Payer: Self-pay | Admitting: General Surgery

## 2016-09-21 ENCOUNTER — Ambulatory Visit (INDEPENDENT_AMBULATORY_CARE_PROVIDER_SITE_OTHER): Payer: Medicare Other | Admitting: Pharmacist Clinician (PhC)/ Clinical Pharmacy Specialist

## 2016-09-21 DIAGNOSIS — Z7901 Long term (current) use of anticoagulants: Secondary | ICD-10-CM

## 2016-09-21 DIAGNOSIS — I482 Chronic atrial fibrillation, unspecified: Secondary | ICD-10-CM

## 2016-09-21 DIAGNOSIS — I4891 Unspecified atrial fibrillation: Secondary | ICD-10-CM

## 2016-09-21 LAB — POCT INR: INR: 1.6

## 2016-09-21 MED ORDER — RIVAROXABAN 15 MG PO TABS: 15.0000 mg | ORAL_TABLET | Freq: Every day | ORAL | 1 refills | Status: DC

## 2016-11-03 ENCOUNTER — Ambulatory Visit (HOSPITAL_COMMUNITY): Payer: Medicare Other | Attending: Cardiology

## 2016-11-03 ENCOUNTER — Other Ambulatory Visit: Payer: Self-pay

## 2016-11-03 DIAGNOSIS — I340 Nonrheumatic mitral (valve) insufficiency: Secondary | ICD-10-CM | POA: Diagnosis not present

## 2016-11-03 DIAGNOSIS — I10 Essential (primary) hypertension: Secondary | ICD-10-CM | POA: Diagnosis not present

## 2016-11-03 DIAGNOSIS — E119 Type 2 diabetes mellitus without complications: Secondary | ICD-10-CM | POA: Diagnosis not present

## 2016-11-11 ENCOUNTER — Telehealth: Payer: Self-pay | Admitting: Internal Medicine

## 2016-11-11 NOTE — Telephone Encounter (Signed)
Attempted to return call to patient x2. Line busy x2

## 2016-11-11 NOTE — Telephone Encounter (Signed)
New message    Patient returning call  For results. Please call

## 2016-11-16 NOTE — Telephone Encounter (Signed)
Patient viewed results in [facility] and is scheduled for MDOV on 12/13/16

## 2016-12-13 ENCOUNTER — Ambulatory Visit: Payer: Medicare Other | Admitting: Internal Medicine

## 2016-12-13 ENCOUNTER — Encounter: Payer: Self-pay | Admitting: Internal Medicine

## 2016-12-13 VITALS — BP 138/88 | HR 99 | Wt 141.0 lb

## 2016-12-13 DIAGNOSIS — D500 Iron deficiency anemia secondary to blood loss (chronic): Secondary | ICD-10-CM

## 2016-12-13 DIAGNOSIS — I482 Chronic atrial fibrillation, unspecified: Secondary | ICD-10-CM

## 2016-12-13 DIAGNOSIS — R634 Abnormal weight loss: Secondary | ICD-10-CM | POA: Insufficient documentation

## 2016-12-13 DIAGNOSIS — I340 Nonrheumatic mitral (valve) insufficiency: Secondary | ICD-10-CM

## 2016-12-13 NOTE — Patient Instructions (Signed)
Dr Debara Pickett recommends that you schedule a follow-up appointment in 6 months. You will receive a reminder letter in the mail two months in advance. If you don't receive a letter, please call our office to schedule the follow-up appointment.  If you need a refill on your cardiac medications before your next appointment, please call your pharmacy.

## 2016-12-13 NOTE — Progress Notes (Signed)
OFFICE NOTE  Chief Complaint:  Follow-up, weight loss  Primary Care Physician: Lavone Orn, MD  HPI:  [name] is a 83 y.o. male former patient of Dr. Rollene Fare, who last saw him in the office in 2012 therefore she is a new patient to our practice. [name] has a history of paroxysmal atrial fibrillation in the past with sick sinus syndrome. He was a previous patient of Dr. Melvern Banker. He also had a history of possible hypertension and was on a number of medications in the past but has struggled with low blood pressure at times. He has a history of cancer, particularly pancreatic cancer status post Whipple and recurrent lung cancer which was an adenocarcinoma in 2007's status post VATS and left lower lobe wedge resection. He had a second procedure in 2010 and PAF at that time. He's also had atrial flutter. He has valvular heart disease including mitral and aortic valve disease and his last echocardiogram was in 2012 which showed normal LV function. He had a nuclear stress test in 2010 which showed no ischemia. He's not had a heart catheterization. Recently he's had some dizziness with positional changes. His diltiazem was decreased from 240-180 mg daily. He is also on warfarin for anticoagulation and takes digoxin which is been taking for a number of years. He is not had a recent digoxin level.  10/30/2015  [name] returns today for follow-up. He discontinued digoxin and notes no difference in his a-fib. Echo was performed and demonstrates LVEF 65-70% with  moderate to severe mitral regurgitaton and biatrial enlargement. Prolapse of the posterior mitral leaflet is noted. He does reports some fatigue and dyspnea when walking up stairs or inclines, but does fine on level ground.  05/03/2016  [name] for returns today for follow-up. Over the past 6 months she's done pretty well. He denies any worsening shortness of breath or chest pain. Blood pressure is well-controlled today  130/80 and weight is appropriate. As we have previously discussed his echo showed more moderate to severe mitral regurgitation. We have previously discussed possible TEE however he was not too interested in it. Since he is relatively asymptomatic, he would prefer rather to have another surface echo to see if his mitral regurgitation is stable. He remains in atrial fibrillation which is likely permanent at a rate of 92.  12/13/2016  [name] returns today for follow-up.  He has had some significant weight loss since I last saw him.  Weight in 04/2016 was 149 pounds, but today is 141 pounds.  He says that his appetite is pretty good however he has food that goes right through him.  He does have a history of pancreatic cancer in the past and underwent a Whipple procedure, chemotherapy and XRT as well as a history of lung cancer.  Given the new weight loss he is concerned that may have a recurrence of this and is going to reach out to his oncologist, Dr. Marin Olp.  He recently repeated an echocardiogram which showed only moderate mitral regurgitation.  LVEF is normal.  There is a mildly dilated aortic root at 3.9 cm.  His A. fib is generally well rate controlled and he denies any bleeding problems or issues on Xarelto.  He is on the reduced dose of 15 mg.  PMHx:  Past Medical History:  Diagnosis Date  . Cancer of ampulla of Vater ([place]) 01/27/2013  . Diabetes mellitus   . H/O echocardiogram 2003,2009,2010, 2012  . Hypertension   .  Iron deficiency anemia, unspecified 01/27/2013  . Lung cancer ([place]) dx'd 02/2009   surg only  . Lung cancer, lower lobe ([place]) 01/27/2013  . pancreatic ca dx'd 2004   oral chemo/xrt comp    History reviewed. No pertinent surgical history.  FAMHx:  Family History  Problem Relation Age of Onset  . Emphysema Father   . Lung cancer Brother     SOCHx:   reports that he quit smoking about 59 years ago. His smoking use included cigarettes. He started smoking about 66  years ago. He has a 14.00 pack-year smoking history. he has never used smokeless tobacco. His alcohol and drug histories are not on file.  ALLERGIES:  Allergies  Allergen Reactions  . Promethazine Other (See Comments)    Makes hyper    ROS: Pertinent items noted in HPI and remainder of comprehensive ROS otherwise negative.  HOME MEDS: Current Outpatient Medications  Medication Sig Dispense Refill  . atorvastatin (LIPITOR) 10 MG tablet Take 10 mg by mouth every Monday, Wednesday, and Friday.     . diltiazem (DILACOR XR) 240 MG 24 hr capsule Take 240 mg by mouth daily.    Marland Kitchen glimepiride (AMARYL) 2 MG tablet Take 2 mg by mouth daily before breakfast.    . levothyroxine (SYNTHROID, LEVOTHROID) 100 MCG tablet Take 100 mcg by mouth daily.    . metFORMIN (GLUCOPHAGE) 500 MG tablet Take 500 mg 3 (three) times daily by mouth. Take 2 tablets in the morning and 1 tablet at night.    . Rivaroxaban (XARELTO) 15 MG TABS tablet Take 1 tablet (15 mg total) by mouth daily with supper. 90 tablet 1  . zolpidem (AMBIEN) 5 MG tablet Take 5 mg by mouth at bedtime as needed for sleep.      No current facility-administered medications for this visit.     LABS/IMAGING: No results found for this or any previous visit (from the past 48 hour(s)). No results found.  WEIGHTS: Wt Readings from Last 3 Encounters:  12/13/16 141 lb (64 kg)  07/11/16 149 lb 8 oz (67.8 kg)  06/27/16 148 lb (67.1 kg)    VITALS: BP 138/88   Pulse 99   Wt 141 lb (64 kg)   BMI 22.08 kg/m   EXAM: General appearance: alert and no distress Neck: no carotid bruit, no JVD and thyroid not enlarged, symmetric, no tenderness/mass/nodules Lungs: clear to auscultation bilaterally Heart: irregularly irregular rhythm Abdomen: soft, non-tender; bowel sounds normal; no masses,  no organomegaly Extremities: extremities normal, atraumatic, no cyanosis or edema Pulses: 2+ and symmetric Skin: Skin color, texture, turgor normal. No rashes  or lesions Neurologic: Grossly normal Psych: Pleasant  EKG: Atrial flutter with a variable AV block at 99-personally reviewed  ASSESSMENT: 1. Long-standing persistent atrial fibrillation - CHADSVASC score of 3 2. Moderate MR 3. Dyslipidemia 4. History of GI and lung adenocarcinoma -recent weight loss 5. Hypertension 6. Dizziness 7.   PLAN: 1.   Mr. Bell says that his breathing has improved with recent weight loss however is been unintentional and concerning given his history of GI lung adenocarcinoma.  He is use pancreatic enzymes in the past and may have some element of pancreatic exocrine insufficiency given his loose stools after eating.  I have highly encouraged him to follow-up with his oncologist given his recent weight loss.  He has mitral regurgitation appears to be moderate and stable, therefore will not pursue any further workup at this time.  Continue Xarelto.  Follow-up with me in 6  months.  Pixie Casino, MD, [facility] Attending Cardiologist [place] Uyen Eichholz 12/13/2016, 8:37 AM

## 2016-12-19 ENCOUNTER — Ambulatory Visit
Admission: RE | Admit: 2016-12-19 | Discharge: 2016-12-19 | Disposition: A | Discharge status: Home / Self Care | Payer: Medicare Other | Source: Ambulatory Visit | Attending: Internal Medicine | Admitting: Internal Medicine

## 2016-12-19 ENCOUNTER — Other Ambulatory Visit: Payer: Self-pay | Admitting: Internal Medicine

## 2016-12-19 DIAGNOSIS — R0602 Shortness of breath: Secondary | ICD-10-CM

## 2016-12-21 ENCOUNTER — Other Ambulatory Visit (HOSPITAL_COMMUNITY): Payer: Self-pay | Admitting: Internal Medicine

## 2016-12-22 ENCOUNTER — Other Ambulatory Visit (HOSPITAL_COMMUNITY): Payer: Self-pay | Admitting: Internal Medicine

## 2016-12-22 DIAGNOSIS — J181 Lobar pneumonia, unspecified organism: Secondary | ICD-10-CM

## 2017-01-05 ENCOUNTER — Encounter: Payer: Self-pay | Admitting: Hematology & Oncology

## 2017-01-10 ENCOUNTER — Encounter (HOSPITAL_COMMUNITY): Payer: Medicare Other

## 2017-01-12 ENCOUNTER — Encounter (HOSPITAL_COMMUNITY)
Admission: RE | Admit: 2017-01-12 | Discharge: 2017-01-12 | Disposition: A | Discharge status: Home / Self Care | Payer: Medicare Other | Source: Ambulatory Visit | Attending: Internal Medicine | Admitting: Internal Medicine

## 2017-01-12 DIAGNOSIS — J181 Lobar pneumonia, unspecified organism: Secondary | ICD-10-CM | POA: Insufficient documentation

## 2017-01-12 LAB — GLUCOSE, CAPILLARY: Glucose-Capillary: 194 mg/dL — ABNORMAL HIGH (ref 65–99)

## 2017-01-12 MED ORDER — FLUDEOXYGLUCOSE F - 18 (FDG) INJECTION
7.0400 | Freq: Once | INTRAVENOUS | Status: AC | PRN
  Administered 2017-01-12: 7.04 via INTRAVENOUS

## 2017-01-13 ENCOUNTER — Other Ambulatory Visit: Payer: Self-pay

## 2017-01-13 ENCOUNTER — Other Ambulatory Visit: Payer: Medicare Other

## 2017-01-13 ENCOUNTER — Encounter: Payer: Self-pay | Admitting: Hematology & Oncology

## 2017-01-13 ENCOUNTER — Ambulatory Visit (HOSPITAL_BASED_OUTPATIENT_CLINIC_OR_DEPARTMENT_OTHER): Payer: Medicare Other | Admitting: Hematology & Oncology

## 2017-01-13 DIAGNOSIS — M899 Disorder of bone, unspecified: Secondary | ICD-10-CM

## 2017-01-13 DIAGNOSIS — Z8507 Personal history of malignant neoplasm of pancreas: Secondary | ICD-10-CM

## 2017-01-13 DIAGNOSIS — D509 Iron deficiency anemia, unspecified: Secondary | ICD-10-CM | POA: Diagnosis not present

## 2017-01-13 DIAGNOSIS — L989 Disorder of the skin and subcutaneous tissue, unspecified: Secondary | ICD-10-CM | POA: Diagnosis not present

## 2017-01-13 DIAGNOSIS — R918 Other nonspecific abnormal finding of lung field: Secondary | ICD-10-CM | POA: Diagnosis not present

## 2017-01-13 DIAGNOSIS — C3430 Malignant neoplasm of lower lobe, unspecified bronchus or lung: Secondary | ICD-10-CM

## 2017-01-13 DIAGNOSIS — Z85118 Personal history of other malignant neoplasm of bronchus and lung: Secondary | ICD-10-CM | POA: Diagnosis not present

## 2017-01-13 LAB — CBC WITH DIFFERENTIAL (CANCER CENTER ONLY)
BASO#: 0 10*3/uL (ref 0.0–0.2)
BASO%: 0.5 % (ref 0.0–2.0)
EOS ABS: 0.2 10*3/uL (ref 0.0–0.5)
EOS%: 2.8 % (ref 0.0–7.0)
HEMATOCRIT: 36.6 % — AB (ref 38.7–49.9)
HGB: 12.3 g/dL — ABNORMAL LOW (ref 13.0–17.1)
LYMPH#: 0.9 10*3/uL (ref 0.9–3.3)
LYMPH%: 11.5 % — ABNORMAL LOW (ref 14.0–48.0)
MCH: 31.4 pg (ref 28.0–33.4)
MCHC: 33.6 g/dL (ref 32.0–35.9)
MCV: 93 fL (ref 82–98)
MONO#: 0.7 10*3/uL (ref 0.1–0.9)
MONO%: 9.6 % (ref 0.0–13.0)
NEUT%: 75.6 % (ref 40.0–80.0)
NEUTROS ABS: 5.7 10*3/uL (ref 1.5–6.5)
Platelets: 118 10*3/uL — ABNORMAL LOW (ref 145–400)
RBC: 3.92 10*6/uL — ABNORMAL LOW (ref 4.20–5.70)
RDW: 13.4 % (ref 11.1–15.7)
WBC: 7.6 10*3/uL (ref 4.0–10.0)

## 2017-01-13 LAB — CMP (CANCER CENTER ONLY)
ALBUMIN: 3.6 g/dL (ref 3.3–5.5)
ALK PHOS: 81 U/L (ref 26–84)
ALT(SGPT): 22 U/L (ref 10–47)
AST: 18 U/L (ref 11–38)
BILIRUBIN TOTAL: 0.8 mg/dL (ref 0.20–1.60)
BUN, Bld: 13 mg/dL (ref 7–22)
CALCIUM: 9.4 mg/dL (ref 8.0–10.3)
CO2: 32 meq/L (ref 18–33)
Chloride: 99 mEq/L (ref 98–108)
Creat: 1.3 mg/dl — ABNORMAL HIGH (ref 0.6–1.2)
Glucose, Bld: 227 mg/dL — ABNORMAL HIGH (ref 73–118)
POTASSIUM: 3.4 meq/L (ref 3.3–4.7)
Sodium: 142 mEq/L (ref 128–145)
Total Protein: 6.4 g/dL (ref 6.4–8.1)

## 2017-01-13 NOTE — Progress Notes (Signed)
Hematology and Oncology Follow Up Visit  [name] [id number] Jan 04, 1934 83 y.o. 01/13/2017   Principle Diagnosis:  Stage Ia adenocarcinoma of the left lung-resected. 2. 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 3. Recurrent iron-deficiency anemia. 4. Hypotestosteronemia.  Current Therapy:   IV iron as indicated.     Interim History:  Mr.  [name] is back for followup.  Clearly, everything has changed.  His weight is down even more.  His weight now is about 140 pounds.  His appetite is doing okay.  He denies any obvious shortness of breath.  He says he does feel more fatigued.  He following had his PET scan done.  This was done on December 6.  This is markedly different now.  He now has extensive left pleural thickening.  This has a low-grade metabolic activity with an SUV of 2.8.  He has complete plugging of the left tracheobronchial tree.  There is a retrohilar airspace opacity with an SUV of 3.3.  He has new and enlarging right pulmonary nodules.  The largest measures 9 x 6 mm.  He has new sclerotic lesions in his bones.  There is activity at T4, and right posterior acetabulum.  There is also activity in left third rib and left first and second rib.  All of these areas are new.  I think this is all highly indicative of [place] finally finding the recurrent malignancy.   We have been following his CA 19-9.  Back in June, it was 835.  He has no cough.  He has had no bleeding.  He is off his blood thinner.  He has a history of atrial fibrillation.  He will not take Xarelto.  He took himself off this.  He has had some left scapular pain.  I think this is more indicative of this pleural thickening.  Of note, there is no obvious exposure to asbestos.  Currently, I would say his performance status is ECOG 1-2.  Medications:  Current Outpatient Medications:  .  atorvastatin (LIPITOR) 10 MG tablet, Take 10 mg by mouth every Monday, Wednesday, and Friday. , Disp: , Rfl:   .  diltiazem (CARDIZEM) 60 MG tablet, Take 60 mg daily as needed by mouth., Disp: , Rfl:  .  diltiazem (DILACOR XR) 240 MG 24 hr capsule, Take 240 mg by mouth daily., Disp: , Rfl:  .  glimepiride (AMARYL) 2 MG tablet, Take 2 mg by mouth daily before breakfast., Disp: , Rfl:  .  levothyroxine (SYNTHROID, LEVOTHROID) 100 MCG tablet, Take 100 mcg by mouth daily., Disp: , Rfl:  .  metFORMIN (GLUCOPHAGE) 500 MG tablet, Take 500 mg 3 (three) times daily by mouth. Take 2 tablets in the morning and 1 tablet at night., Disp: , Rfl:  .  Rivaroxaban (XARELTO) 15 MG TABS tablet, Take 1 tablet (15 mg total) by mouth daily with supper., Disp: 90 tablet, Rfl: 1 .  zolpidem (AMBIEN) 5 MG tablet, Take 5 mg by mouth at bedtime as needed for sleep. , Disp: , Rfl:   Allergies:  Allergies  Allergen Reactions  . Promethazine Other (See Comments)    Makes hyper    Past Medical History, Surgical history, Social history, and Family History were reviewed and updated.  Review of Systems: As stated in the interim history  Physical Exam:  weight is 139 lb (63 kg). His oral temperature is 97.8 F (36.6 C). His blood pressure is 121/86 and his pulse is 66. His respiration is 16  and oxygen saturation is 98%.   Thin white male in no obvious distress.  Head exam shows no ocular or oral lesions.  He has no adenopathy in the neck.  Lungs show decreased breath sounds over on the left side.  Right side is clear.  He has some scattered wheezes bilaterally.  Cardiac exam slightly tachycardic but regular.  He has no murmurs.  Abdomen is soft.  There is no fluid wave.  There is no palpable abdominal mass.  There is no palpable liver or spleen tip.  Back exam shows no tenderness over the spine, ribs or hips.  Extremities shows no clubbing, cyanosis or edema.  He has some symmetric muscle atrophy in upper and lower extremities.  Neurological exam shows no focal neurological deficits.    Lab Results  Component Value Date   WBC 7.6  01/13/2017   HGB 12.3 (L) 01/13/2017   HCT 36.6 (L) 01/13/2017   MCV 93 01/13/2017   PLT 118 (L) 01/13/2017     Chemistry      Component Value Date/Time   NA 142 01/13/2017 1200   NA 141 05/10/2016 1101   K 3.4 01/13/2017 1200   K 4.4 05/10/2016 1101   CL 99 01/13/2017 1200   CO2 32 01/13/2017 1200   CO2 30 (H) 05/10/2016 1101   BUN 13 01/13/2017 1200   BUN 17.6 05/10/2016 1101   CREATININE 1.3 (H) 01/13/2017 1200   CREATININE 1.1 05/10/2016 1101      Component Value Date/Time   CALCIUM 9.4 01/13/2017 1200   CALCIUM 9.8 05/10/2016 1101   ALKPHOS 81 01/13/2017 1200   ALKPHOS 79 05/10/2016 1101   AST 18 01/13/2017 1200   AST 14 05/10/2016 1101   ALT 22 01/13/2017 1200   ALT 11 05/10/2016 1101   BILITOT 0.80 01/13/2017 1200   BILITOT 0.53 05/10/2016 1101         Impression and Plan: Mr. [name] is 83 year old gentleman. He had a history of stage III adenocarcinoma of the pancreas. This was probably 10 years ago. He then had a stage I lung cancer resected in January of 2010.  Unfortunately, I think that we clearly have obvious metastatic disease.  We definitely need a biopsy.  I spoke to Mr. [name] for about 45 minutes.  I showed him the PET scans.  I explained my recommendations to him.  I explained the need for a biopsy.  I told him that with likely adenocarcinoma of the lung that is metastatic, we might be able to utilize targeted therapy.  I am just very disappointed that we are at this situation now.  In talking to him, he may not even want any therapy.  He says that he is "83 years old and you have to go of something."  We will try to get the biopsy next week.  I will then see about getting genetic studies.  I have known him for over 15 years.  It just really disappoints me that we now have obvious recurrent disease.   [name], MD 12/7/20181:49 PM

## 2017-01-14 LAB — CANCER ANTIGEN 19-9: CA 19-9: 3741 U/mL — ABNORMAL HIGH (ref 0–35)

## 2017-01-17 LAB — IRON AND TIBC
%SAT: 14 % — AB (ref 20–55)
Iron: 35 ug/dL — ABNORMAL LOW (ref 42–163)
TIBC: 243 ug/dL (ref 202–409)
UIBC: 208 ug/dL (ref 117–376)

## 2017-01-17 LAB — FERRITIN: FERRITIN: 135 ng/mL (ref 22–316)

## 2017-01-18 ENCOUNTER — Other Ambulatory Visit: Payer: Self-pay | Admitting: Radiology

## 2017-01-24 ENCOUNTER — Other Ambulatory Visit: Payer: Self-pay | Admitting: Radiology

## 2017-01-25 ENCOUNTER — Ambulatory Visit (HOSPITAL_COMMUNITY)
Admission: RE | Admit: 2017-01-25 | Discharge: 2017-01-25 | Disposition: A | Discharge status: Home / Self Care | Payer: Medicare Other | Source: Ambulatory Visit | Attending: Hematology & Oncology | Admitting: Hematology & Oncology

## 2017-01-25 ENCOUNTER — Other Ambulatory Visit: Payer: Self-pay | Admitting: Hematology & Oncology

## 2017-01-25 DIAGNOSIS — C3430 Malignant neoplasm of lower lobe, unspecified bronchus or lung: Secondary | ICD-10-CM | POA: Insufficient documentation

## 2017-01-25 DIAGNOSIS — Z8507 Personal history of malignant neoplasm of pancreas: Secondary | ICD-10-CM | POA: Insufficient documentation

## 2017-01-25 DIAGNOSIS — Z87891 Personal history of nicotine dependence: Secondary | ICD-10-CM | POA: Diagnosis not present

## 2017-01-25 DIAGNOSIS — J700 Acute pulmonary manifestations due to radiation: Secondary | ICD-10-CM | POA: Diagnosis not present

## 2017-01-25 DIAGNOSIS — E119 Type 2 diabetes mellitus without complications: Secondary | ICD-10-CM | POA: Diagnosis not present

## 2017-01-25 DIAGNOSIS — Z79899 Other long term (current) drug therapy: Secondary | ICD-10-CM | POA: Diagnosis not present

## 2017-01-25 DIAGNOSIS — Y842 Radiological procedure and radiotherapy as the cause of abnormal reaction of the patient, or of later complication, without mention of misadventure at the time of the procedure: Secondary | ICD-10-CM | POA: Diagnosis not present

## 2017-01-25 DIAGNOSIS — J90 Pleural effusion, not elsewhere classified: Secondary | ICD-10-CM | POA: Diagnosis not present

## 2017-01-25 DIAGNOSIS — Z825 Family history of asthma and other chronic lower respiratory diseases: Secondary | ICD-10-CM | POA: Diagnosis not present

## 2017-01-25 DIAGNOSIS — K5730 Diverticulosis of large intestine without perforation or abscess without bleeding: Secondary | ICD-10-CM | POA: Insufficient documentation

## 2017-01-25 DIAGNOSIS — I10 Essential (primary) hypertension: Secondary | ICD-10-CM | POA: Diagnosis not present

## 2017-01-25 DIAGNOSIS — I700 Atherosclerosis of aorta: Secondary | ICD-10-CM | POA: Diagnosis not present

## 2017-01-25 DIAGNOSIS — Z801 Family history of malignant neoplasm of trachea, bronchus and lung: Secondary | ICD-10-CM | POA: Insufficient documentation

## 2017-01-25 DIAGNOSIS — Z7984 Long term (current) use of oral hypoglycemic drugs: Secondary | ICD-10-CM | POA: Insufficient documentation

## 2017-01-25 DIAGNOSIS — I4891 Unspecified atrial fibrillation: Secondary | ICD-10-CM | POA: Diagnosis not present

## 2017-01-25 DIAGNOSIS — Z7901 Long term (current) use of anticoagulants: Secondary | ICD-10-CM | POA: Insufficient documentation

## 2017-01-25 DIAGNOSIS — Z888 Allergy status to other drugs, medicaments and biological substances status: Secondary | ICD-10-CM | POA: Diagnosis not present

## 2017-01-25 DIAGNOSIS — Z9889 Other specified postprocedural states: Secondary | ICD-10-CM | POA: Diagnosis not present

## 2017-01-25 DIAGNOSIS — C259 Malignant neoplasm of pancreas, unspecified: Secondary | ICD-10-CM | POA: Insufficient documentation

## 2017-01-25 LAB — CBC
HEMATOCRIT: 38.2 % — AB (ref 39.0–52.0)
HEMOGLOBIN: 12.5 g/dL — AB (ref 13.0–17.0)
MCH: 30.3 pg (ref 26.0–34.0)
MCHC: 32.7 g/dL (ref 30.0–36.0)
MCV: 92.7 fL (ref 78.0–100.0)
Platelets: 120 10*3/uL — ABNORMAL LOW (ref 150–400)
RBC: 4.12 MIL/uL — ABNORMAL LOW (ref 4.22–5.81)
RDW: 13.9 % (ref 11.5–15.5)
WBC: 7.2 10*3/uL (ref 4.0–10.5)

## 2017-01-25 LAB — PROTIME-INR
INR: 1.02
Prothrombin Time: 13.3 seconds (ref 11.4–15.2)

## 2017-01-25 LAB — GLUCOSE, CAPILLARY
GLUCOSE-CAPILLARY: 271 mg/dL — AB (ref 65–99)
Glucose-Capillary: 253 mg/dL — ABNORMAL HIGH (ref 65–99)

## 2017-01-25 LAB — APTT: APTT: 31 s (ref 24–36)

## 2017-01-25 MED ORDER — SODIUM CHLORIDE 0.9 % IV SOLN: INTRAVENOUS | Status: DC

## 2017-01-25 MED ORDER — MIDAZOLAM HCL 2 MG/2ML IJ SOLN
INTRAMUSCULAR | Status: AC | PRN
  Administered 2017-01-25 (×2): 1 mg via INTRAVENOUS

## 2017-01-25 MED ORDER — FENTANYL CITRATE (PF) 100 MCG/2ML IJ SOLN
INTRAMUSCULAR | Status: AC
  Filled 2017-01-25: qty 4

## 2017-01-25 MED ORDER — DILTIAZEM HCL ER 60 MG PO CP12
60.0000 mg | ORAL_CAPSULE | Freq: Once | ORAL | Status: DC
  Filled 2017-01-25: qty 1

## 2017-01-25 MED ORDER — LIDOCAINE HCL 1 % IJ SOLN
INTRAMUSCULAR | Status: AC
  Filled 2017-01-25: qty 20

## 2017-01-25 MED ORDER — FENTANYL CITRATE (PF) 100 MCG/2ML IJ SOLN
INTRAMUSCULAR | Status: AC | PRN
  Administered 2017-01-25: 50 ug via INTRAVENOUS
  Administered 2017-01-25: 25 ug via INTRAVENOUS

## 2017-01-25 MED ORDER — DILTIAZEM HCL ER 90 MG PO CP12
90.0000 mg | ORAL_CAPSULE | Freq: Once | ORAL | Status: AC
  Administered 2017-01-25: 90 mg via ORAL
  Filled 2017-01-25: qty 1

## 2017-01-25 MED ORDER — MIDAZOLAM HCL 2 MG/2ML IJ SOLN
INTRAMUSCULAR | Status: AC
  Filled 2017-01-25: qty 4

## 2017-01-25 MED ORDER — HYDROCODONE-ACETAMINOPHEN 5-325 MG PO TABS: 1.0000 | ORAL_TABLET | ORAL | Status: DC | PRN

## 2017-01-25 NOTE — H&P (Signed)
Chief Complaint: Patient was seen in consultation today for lung cancer.   Referring Physician(s): Ennever,Peter R  Supervising Physician: Arne Cleveland  Patient Status: [facility] - In-pt  History of Present Illness: [name] is a 83 y.o. male with past medical history of DM, HTN, a fib on Xarelto, lung cancer and pancreatic cancer presents with new findings on recent PET scan.   PET 01/12/17: 1. Complete plugging of the left tracheobronchial tree with volume loss in the left hemithorax and new loculated pleural effusion forming a rind around the remaining aerated lung. Low-grade metabolic activity within this pleural fluid SUV up to 2.8, nonspecific for malignancy. 2. The degree of left perihilar metabolic activity is mildly reduced compared to prior. This may simply represent evolutionary changes in the radiation pneumonitis. The amount of perihilar airspace opacity, however, is increased compared to prior. 3. There are new sclerotic lesions for example in the T4 vertebra and right posterior acetabular wall, which are mildly hypermetabolic suspicious for malignancy. There is also some new increased sclerosis anterolaterally in the left third rib in addition to the stable irregular sclerosis in the left first and second ribs. Suspected progression of osseous metastatic disease. 4. New and enlarging right pulmonary nodules, although still below sensitive PET-CT size thresholds. 5. Enlarging cystic lesion of the tip of the pancreatic tail, not currently hypermetabolic, potentially a postinflammatory cyst or IPMN, merit surveillance.  IR consulted for biopsy of pleural disease.  Case reviewed by Dr. Vernard Gambles who approves patient for procedure.   Patient presents today in his usual state of health.  He has been NPO.  He does take Xarelto for history of a fib, however has held appropriately for procedure today.   Past Medical History:  Diagnosis Date  . 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 Diabetes mellitus   . H/O echocardiogram 2003,2009,2010, 2012  . Hypertension   . Iron deficiency anemia, unspecified 01/27/2013  . Lung cancer ([place]) dx'd 02/2009   surg only  . Lung cancer, lower lobe ([place]) 01/27/2013  . pancreatic ca dx'd 2004   oral chemo/xrt comp    Past Surgical History:  Procedure Laterality Date  . VIDEO BRONCHOSCOPY WITH ENDOBRONCHIAL ULTRASOUND N/A 10/20/2014   Procedure: VIDEO BRONCHOSCOPY WITH ENDOBRONCHIAL ULTRASOUND;  Surgeon: Melrose Nakayama, MD;  Location: [facility] OR;  Service: Thoracic;  Laterality: N/A;    Allergies: Promethazine  Medications: Prior to Admission medications   Medication Sig Start Date End Date Taking? Authorizing Provider  atorvastatin (LIPITOR) 10 MG tablet Take 10 mg by mouth 2 (two) times a week. Monday & Friday. 09/22/11  Yes [provider]  diltiazem (DILACOR XR) 240 MG 24 hr capsule Take 240 mg by mouth daily.   Yes [provider]  glimepiride (AMARYL) 2 MG tablet Take 2 mg by mouth daily before breakfast.   Yes [provider]  levothyroxine (SYNTHROID, LEVOTHROID) 100 MCG tablet Take 100 mcg by mouth daily before breakfast.  12/20/13  Yes [provider]  metFORMIN (GLUCOPHAGE-XR) 500 MG 24 hr tablet Take 500-1,000 mg by mouth 2 (two) times daily. Take 2 tablets (1000 mg) in the morning, and 1 tablet (500 mg) in the evening.   Yes [provider]  Rivaroxaban (XARELTO) 15 MG TABS tablet Take 1 tablet (15 mg total) by mouth daily with supper. 09/21/16  Yes Hilty, Nadean Corwin, MD  zolpidem (AMBIEN) 5 MG tablet Take 5 mg by mouth at bedtime as needed for sleep.  01/24/13  Yes [provider]  diltiazem (CARDIZEM) 60 MG tablet Take 60 mg by mouth 3 (three) times daily as needed (for heart palpitations).     [provider]     Family History  Problem Relation Age of Onset  . Emphysema Father   . Lung cancer Brother     Social  History   Socioeconomic History  . Marital status: Married    Spouse name: Not on file  . Number of children: Not on file  . Years of education: Not on file  . Highest education level: Not on file  Social Needs  . Financial resource strain: Not on file  . Food insecurity - worry: Not on file  . Food insecurity - inability: Not on file  . Transportation needs - medical: Not on file  . Transportation needs - non-medical: Not on file  Occupational History  . Not on file  Tobacco Use  . Smoking status: Former Smoker    Packs/day: 2.00    Years: 7.00    Pack years: 14.00    Types: Cigarettes    Start date: 09/30/1950    Last attempt to quit: 04/29/1957    Years since quitting: 59.7  . Smokeless tobacco: Never Used  . Tobacco comment: quit 54 years ago  Substance and Sexual Activity  . Alcohol use: Not on file  . Drug use: Not on file  . Sexual activity: Not on file  Other Topics Concern  . Not on file  Social History Narrative  . Not on file    Review of Systems  Constitutional: Negative for fatigue and fever.  Respiratory: Negative for cough and shortness of breath.   Cardiovascular: Negative for chest pain.  Gastrointestinal: Negative for abdominal pain.  Psychiatric/Behavioral: Negative for behavioral problems and confusion.    Vital Signs: BP (!) 146/105 (BP Location: Right Arm)   Pulse (!) 117   Temp 98.1 F (36.7 C) (Oral)   Ht 5' 7.5" (1.715 m)   Wt 133 lb (60.3 kg)   SpO2 97%   BMI 20.52 kg/m   Physical Exam  Constitutional: He is oriented to person, place, and time. He appears well-developed.  Cardiovascular: Normal heart sounds.  Irregular irregular  Pulmonary/Chest: Effort normal and breath sounds normal. No respiratory distress.  Abdominal: Soft.  Neurological: He is alert and oriented to person, place, and time.  Skin: Skin is warm and dry.  Psychiatric: He has a normal mood and affect. His behavior is normal. Judgment and thought content normal.    Nursing note and vitals reviewed.   Imaging: Nm Pet Image Restag (ps) Skull Base To Thigh  Result Date: 01/12/2017 CLINICAL DATA:  Subsequent treatment strategy for lung cancer. History pancreatic cancer with prior Whipple procedure. EXAM: NUCLEAR MEDICINE PET SKULL BASE TO THIGH TECHNIQUE: 7.0 mCi F-18 FDG was injected intravenously. Full-ring PET imaging was performed from the skull base to thigh after the radiotracer. CT data was obtained and used for attenuation correction and anatomic localization. FASTING BLOOD GLUCOSE:  Value: 194 mg/dl COMPARISON:  Multiple exams, including 09/22/2015 FINDINGS: NECK No hypermetabolic lymph nodes in the neck. CHEST Extensive new left pleural thickening/ loculated pleural effusions with low-grade metabolic activity along the pleural thickening, SUV up to about 2.8. Complete plugging of the left tracheobronchial tree with left suprahilar, perihilar, and infrahilar airspace opacities and atelectasis especially in the left upper lobe. The associated retro hilar airspace opacity has a maximum SUV of up to about 3.3, formerly 5.6. There is  shift of cardiac and mediastinal structures to the left. New and enlarging right pulmonary nodules. Apical segment right upper lobe pulmonary nodule measures 0.9 by 0.6 cm on image 9/8, formerly 0.7 by 0.4 cm. A new nodule in the right upper lobe measures 5 mm in diameter on image 18/6. None of these nodules are currently perceptibly hypermetabolic but they are below sensitive PET-CT size thresholds. Coronary, aortic arch, and branch vessel atherosclerotic vascular disease. Mild cardiomegaly. ABDOMEN/PELVIS TheNo abnormal hypermetabolic activity within the liver, pancreas, adrenal glands, or spleen. No hypermetabolic lymph nodes in the abdomen or pelvis. 1.7 cm cystic lesion of the tip of the pancreatic tail is not hypermetabolic, and previously measured at most 0.7 cm. Prior Whipple procedure. Prominent stool throughout the colon favors  constipation. Septated large left kidney lower pole cyst with some faint calcifications along the septation similar to prior. Other cysts are present and there is a small hyperdense lesion along the right posterior renal capsule which is probably a complex cyst. Non rotated right kidney. Aortoiliac atherosclerotic vascular disease. Sigmoid diverticulosis. SKELETON Chronic sclerosis of the left first, second, and third ribs without significant associated hypermetabolic adenopathy. Chronic sclerotic right sacral ala lesion on image 164/4 measures 2.2 cm in long axis, without associated accentuated metabolic activity. 1.6 cm sclerotic lesion in the posterior wall of the right acetabulum on image 185/4 is new compared 09/22/2015 and has faintly accentuated metabolic activity with maximum SUV 2.5, previously 1.6 in this same area. New sclerosis in the T4 vertebral body with faintly accentuated metabolic activity, maximum SUV 2.5. IMPRESSION: 1. Complete plugging of the left tracheobronchial tree with volume loss in the left hemithorax and new loculated pleural effusion forming a rind around the remaining aerated lung. Low-grade metabolic activity within this pleural fluid SUV up to 2.8, nonspecific for malignancy. 2. The degree of left perihilar metabolic activity is mildly reduced compared to prior. This may simply represent evolutionary changes in the radiation pneumonitis. The amount of perihilar airspace opacity, however, is increased compared to prior. 3. There are new sclerotic lesions for example in the T4 vertebra and right posterior acetabular wall, which are mildly hypermetabolic suspicious for malignancy. There is also some new increased sclerosis anterolaterally in the left third rib in addition to the stable irregular sclerosis in the left first and second ribs. Suspected progression of osseous metastatic disease. 4. New and enlarging right pulmonary nodules, although still below sensitive PET-CT size  thresholds. 5. Enlarging cystic lesion of the tip of the pancreatic tail, not currently hypermetabolic, potentially a postinflammatory cyst or IPMN, merit surveillance. 6. Other imaging findings of potential clinical significance: Aortic Atherosclerosis (ICD10-I70.0). Sigmoid diverticulosis. Mild cardiomegaly. Electronically Signed   By: Van Clines M.D.   On: 01/12/2017 11:57    Labs:  CBC: Recent Labs    06/27/16 1300 07/11/16 1050 01/13/17 1200 01/25/17 0630  WBC 6.6 6.6 7.6 7.2  HGB 12.1* 12.3* 12.3* 12.5*  HCT 37.8* 37.7* 36.6* 38.2*  PLT 133* 131* 118* 120*    COAGS: Recent Labs    08/01/16 0910 08/19/16 0920 09/21/16 1155 01/25/17 0630  INR 2.2 2.1 1.6 1.02  APTT  --   --   --  31    BMP: Recent Labs    03/29/16 1321 05/10/16 1101 06/27/16 1300 07/11/16 1050 01/13/17 1200  NA 134 141 138 138 142  K 4.0 4.4 3.9 3.8 3.4  CL 99  --  102 102 99  CO2 30* 30* 28 30 32  GLUCOSE 206*  173* 170* 226* 227*  BUN 15 17.6 20 17 13   CALCIUM 9.7 9.8 9.3 9.5 9.4  CREATININE 0.90 1.1 1.02 1.1 1.3*  GFRNONAA 80  --  >60  --   --   GFRAA 93  --  >60  --   --     LIVER FUNCTION TESTS: Recent Labs    05/10/16 1101 06/27/16 1300 07/11/16 1050 01/13/17 1200  BILITOT 0.53 0.9 1.10 0.80  AST 14 22 24 18   ALT 11 17 23 22   ALKPHOS 79 55 58 81  PROT 7.4 6.8 7.1 6.4  ALBUMIN 3.7 4.0 3.8 3.6    TUMOR MARKERS: No results for input(s): AFPTM, CEA, CA199, CHROMGRNA in the last 8760 hours.  Assessment and Plan: Patient with past medical history of lung and pancreatic cancer presents with complaint of new findings on recent PET scan.  IR consulted for pleural disease biopsy at the request of Dr. Marin Olp. Case reviewed by Dr. Vernard Gambles who approves patient for procedure.  Patient presents today in their usual state of health.  His heart rate is elevated today.  He states he took his diltiazem this morning.  Will give diltiazem SR 60 mg prior to procedure.  He is  asymptomatic. He has been NPO and is not currently on blood thinners.  Risks and benefits discussed with the patient including, but not limited to bleeding, hemoptysis, respiratory failure requiring intubation, infection, pneumothorax requiring chest tube placement, stroke from air embolism or even death. All of the patient's questions were answered, patient is agreeable to proceed. Consent signed and in chart.  Thank you for this interesting consult.  I greatly enjoyed meeting SHELDON SEM and look forward to participating in their care.  A copy of this report was sent to the requesting provider on this date.  Electronically Signed: Docia Barrier, PA 01/25/2017, 8:00 AM   I spent a total of  30 Minutes   in face to face in clinical consultation, greater than 50% of which was counseling/coordinating care for lung cancer.

## 2017-01-25 NOTE — Procedures (Signed)
  Procedure: CT Left thoracentesis 442ml yellow Preprocedure diagnosis: Lung CA Postprocedure diagnosis: same EBL:   minimal Complications:  none immediate  See full dictation in [facility].  Dillard Cannon MD Main # [phone] Pager  [phone]

## 2017-01-25 NOTE — Sedation Documentation (Signed)
Patient is resting comfortably. 

## 2017-01-25 NOTE — Discharge Instructions (Signed)
Needle Biopsy, Care After These instructions give you information about caring for yourself after your procedure. Your doctor may also give you more specific instructions. Call your doctor if you have any problems or questions after your procedure. Follow these instructions at home:  Rest as told by your doctor.  Take medicines only as told by your doctor.  There are many different ways to close and cover the biopsy site, including stitches (sutures), skin glue, and adhesive strips. Follow instructions from your doctor about: ? How to take care of your biopsy site. ? When and how you should change your bandage (dressing). ? When you should remove your dressing. ? Removing whatever was used to close your biopsy site.  Check your biopsy site every day for signs of infection. Watch for: ? Redness, swelling, or pain. ? Fluid, blood, or pus. Contact a doctor if:  You have a fever.  You have redness, swelling, or pain at the biopsy site, and it lasts longer than a few days.  You have fluid, blood, or pus coming from the biopsy site.  You feel sick to your stomach (nauseous).  You throw up (vomit). Get help right away if:  You are short of breath.  You have trouble breathing.  Your chest hurts.  You feel dizzy or you pass out (faint).  You have bleeding that does not stop with pressure or a bandage.  You cough up blood.  Your belly (abdomen) hurts. This information is not intended to replace advice given to you by your health care provider. Make sure you discuss any questions you have with your health care provider. Document Released: 01/07/2008 Document Revised: 07/02/2015 Document Reviewed: 01/20/2014 Elsevier Interactive Patient Education  [employer].

## 2017-01-25 NOTE — Progress Notes (Addendum)
HR 112-130, bp elevated. Pt states he feels fine, denies SOB. Advised to pt and family to call cardiologist to update on bp/hr to further advise. Family agreed to suggestion. Dr Jarvis Newcomer IR was called to verify no cxr and one hour post op. No further orders.

## 2017-02-13 ENCOUNTER — Institutional Professional Consult (permissible substitution): Payer: Medicare Other | Admitting: Thoracic Surgery (Cardiothoracic Vascular Surgery)

## 2017-02-13 ENCOUNTER — Other Ambulatory Visit: Payer: Self-pay

## 2017-02-13 ENCOUNTER — Other Ambulatory Visit: Payer: Self-pay | Admitting: *Deleted

## 2017-02-13 VITALS — BP 157/109 | HR 127 | Resp 18 | Ht 67.5 in | Wt 131.0 lb

## 2017-02-13 DIAGNOSIS — R918 Other nonspecific abnormal finding of lung field: Secondary | ICD-10-CM

## 2017-02-13 NOTE — H&P (View-Only) (Signed)
PCP is Lavone Orn, MD Referring Provider is Marin Olp Rudell Cobb, MD  Chief Complaint  Patient presents with  . Lung Cancer    Surgical Eval,  CT-guided aspiration of left pleural fluid 01/25/17, PET Scan 01/12/2017, HX of Pancreas CA and Lung CA    HPI: [name] is an 84 year old man sent for consultation regarding volume loss and possible tumor in the left chest.  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  In 2016 he had chest x-rays which showed a questionable right lung nodule and a CT of the chest showed soft tissue density around his previous staple line and possible slight enlargement of the left hilar node.  These areas were mildly hypermetabolic on PET/CT.  I did a bronchoscopy and endobronchial ultrasound which showed only inflammation.  We decided to continue to follow him radiographically.  Recently had a repeat PET/CT which showed progressive volume loss on the left side with likely endobronchial tumor and probable pleural tumor with a small pleural effusion.  He had a CT-guided aspiration of the pleural fluid and 400 mL was drained.  Cytologies were negative.  He has been feeling poorly.  He complains of decreased energy.  He is lost about 10 pounds over the past 6 months and 5 pounds over the past 3 months.  He complains of dizzy spells.  He has chronic atrial fibrillation.  He stopped his rivaroxaban about a month ago due to problems with bleeding from an anal fissure. Zubrod Score: At the time of surgery this patient's most appropriate activity status/level should be described as: []     0    Normal activity, no symptoms []     1    Restricted in physical  strenuous activity but ambulatory, able to do out light work [x]     2    Ambulatory and capable of self care, unable to do work activities, up and about >50 % of waking hours                              []     3    Only limited self care, in bed greater than 50% of waking hours []     4    Completely disabled, no self care, confined to bed or chair []     5    Moribund   Past Medical History:  Diagnosis Date  . Cancer of ampulla of Vater ([place]) 01/27/2013  . Diabetes mellitus   . H/O echocardiogram 2003,2009,2010, 2012  . Hypertension   . Iron deficiency anemia, unspecified 01/27/2013  . Lung cancer ([place]) dx'd 02/2009   surg only  . Lung cancer, lower lobe ([place]) 01/27/2013  . pancreatic ca dx'd 2004   oral chemo/xrt comp    Past Surgical History:  Procedure Laterality Date  . VIDEO BRONCHOSCOPY WITH ENDOBRONCHIAL ULTRASOUND N/A 10/20/2014   Procedure: VIDEO BRONCHOSCOPY WITH ENDOBRONCHIAL ULTRASOUND;  Surgeon: Melrose Nakayama, MD;  Location: [facility] OR;  Service: Thoracic;  Laterality: N/A;    Family History  Problem Relation Age of Onset  . Emphysema Father   . Lung cancer Brother     Social History Social History  Tobacco Use  . Smoking status: Former Smoker    Packs/day: 2.00    Years: 7.00    Pack years: 14.00    Types: Cigarettes    Start date: 09/30/1950    Last attempt to quit: 04/29/1957    Years since quitting: 59.8  . Smokeless tobacco: Never Used  . Tobacco comment: quit 54 years ago  Substance Use Topics  . Alcohol use: Not on file  . Drug use: Not on file    Current Outpatient Medications  Medication Sig Dispense Refill  . atorvastatin (LIPITOR) 10 MG tablet Take 10 mg by mouth 2 (two) times a week. Monday & Friday.    . diltiazem (CARDIZEM) 60 MG tablet Take 60 mg by mouth 3 (three) times daily as needed (for heart palpitations).     Marland Kitchen diltiazem (DILACOR XR) 240 MG 24 hr capsule Take 240 mg by mouth daily.    Marland Kitchen glimepiride (AMARYL) 2 MG tablet Take 2  mg by mouth daily before breakfast.    . levothyroxine (SYNTHROID, LEVOTHROID) 100 MCG tablet Take 100 mcg by mouth daily before breakfast.     . metFORMIN (GLUCOPHAGE-XR) 500 MG 24 hr tablet Take 500-1,000 mg by mouth 2 (two) times daily. Take 2 tablets (1000 mg) in the morning, and 1 tablet (500 mg) in the evening.    . Rivaroxaban (XARELTO) 15 MG TABS tablet Take 1 tablet (15 mg total) by mouth daily with supper. 90 tablet 1  . zolpidem (AMBIEN) 5 MG tablet Take 5 mg by mouth at bedtime as needed for sleep.      No current facility-administered medications for this visit.     Allergies  Allergen Reactions  . Promethazine Other (See Comments)    Makes hyper    Review of Systems  Constitutional: Positive for activity change, fatigue and unexpected weight change.  HENT: Negative for trouble swallowing and voice change.   Eyes: Negative for visual disturbance.  Respiratory: Positive for shortness of breath (With exertion). Negative for cough.   Cardiovascular: Negative for chest pain and leg swelling.       Atrial fibrillation  Gastrointestinal: Positive for blood in stool and diarrhea. Negative for abdominal pain.  Genitourinary: Negative for difficulty urinating and dysuria.  Musculoskeletal: Positive for arthralgias (Left shoulder).  Neurological: Positive for dizziness.  Hematological: Negative for adenopathy. Does not bruise/bleed easily.  All other systems reviewed and are negative.   BP (!) 157/109 (BP Location: Right Arm, Cuff Size: Normal)   Pulse (!) 127   Resp 18   Ht 5' 7.5" (1.715 m)   Wt 131 lb (59.4 kg)   SpO2 95% Comment: RA  BMI 20.21 kg/m  Physical Exam  Constitutional: He is oriented to person, place, and time. No distress.  Frail-appearing 84 year old man  HENT:  Head: Normocephalic and atraumatic.  Mouth/Throat: No oropharyngeal exudate.  Eyes: Conjunctivae and EOM are normal. No scleral icterus.  Neck: Neck supple. No thyromegaly present.   Cardiovascular: Normal rate, S1 normal and S2 normal. An irregularly irregular rhythm present.  No murmur heard. Pulmonary/Chest: Effort normal. No respiratory distress. He has no wheezes. He has no rales.  Markedly diminished breath sounds on left  Abdominal: Soft. He exhibits no distension. There is no tenderness.  Musculoskeletal: He exhibits no edema.  Lymphadenopathy:    He has no cervical adenopathy.  Neurological: He is alert and oriented to person, place, and time. No cranial nerve deficit. He exhibits normal muscle tone.  Skin: Skin is warm  and dry.  Vitals reviewed.    Diagnostic Tests: NUCLEAR MEDICINE PET SKULL BASE TO THIGH  TECHNIQUE: 7.0 mCi F-18 FDG was injected intravenously. Full-ring PET imaging was performed from the skull base to thigh after the radiotracer. CT data was obtained and used for attenuation correction and anatomic localization.  FASTING BLOOD GLUCOSE:  Value: 194 mg/dl  COMPARISON:  Multiple exams, including 09/22/2015  FINDINGS: NECK  No hypermetabolic lymph nodes in the neck.  CHEST  Extensive new left pleural thickening/ loculated pleural effusions with low-grade metabolic activity along the pleural thickening, SUV up to about 2.8. Complete plugging of the left tracheobronchial tree with left suprahilar, perihilar, and infrahilar airspace opacities and atelectasis especially in the left upper lobe. The associated retro hilar airspace opacity has a maximum SUV of up to about 3.3, formerly 5.6. There is shift of cardiac and mediastinal structures to the left.  New and enlarging right pulmonary nodules. Apical segment right upper lobe pulmonary nodule measures 0.9 by 0.6 cm on image 9/8, formerly 0.7 by 0.4 cm. A new nodule in the right upper lobe measures 5 mm in diameter on image 18/6. None of these nodules are currently perceptibly hypermetabolic but they are below sensitive PET-CT size thresholds.  Coronary, aortic  arch, and branch vessel atherosclerotic vascular disease. Mild cardiomegaly.  ABDOMEN/PELVIS  TheNo abnormal hypermetabolic activity within the liver, pancreas, adrenal glands, or spleen. No hypermetabolic lymph nodes in the abdomen or pelvis.  1.7 cm cystic lesion of the tip of the pancreatic tail is not hypermetabolic, and previously measured at most 0.7 cm. Prior Whipple procedure. Prominent stool throughout the colon favors constipation. Septated large left kidney lower pole cyst with some faint calcifications along the septation similar to prior. Other cysts are present and there is a small hyperdense lesion along the right posterior renal capsule which is probably a complex cyst. Non rotated right kidney.  Aortoiliac atherosclerotic vascular disease. Sigmoid diverticulosis.  SKELETON  Chronic sclerosis of the left first, second, and third ribs without significant associated hypermetabolic adenopathy. Chronic sclerotic right sacral ala lesion on image 164/4 measures 2.2 cm in long axis, without associated accentuated metabolic activity. 1.6 cm sclerotic lesion in the posterior wall of the right acetabulum on image 185/4 is new compared 09/22/2015 and has faintly accentuated metabolic activity with maximum SUV 2.5, previously 1.6 in this same area. New sclerosis in the T4 vertebral body with faintly accentuated metabolic activity, maximum SUV 2.5.  IMPRESSION: 1. Complete plugging of the left tracheobronchial tree with volume loss in the left hemithorax and new loculated pleural effusion forming a rind around the remaining aerated lung. Low-grade metabolic activity within this pleural fluid SUV up to 2.8, nonspecific for malignancy. 2. The degree of left perihilar metabolic activity is mildly reduced compared to prior. This may simply represent evolutionary changes in the radiation pneumonitis. The amount of perihilar airspace opacity, however, is increased  compared to prior. 3. There are new sclerotic lesions for example in the T4 vertebra and right posterior acetabular wall, which are mildly hypermetabolic suspicious for malignancy. There is also some new increased sclerosis anterolaterally in the left third rib in addition to the stable irregular sclerosis in the left first and second ribs. Suspected progression of osseous metastatic disease. 4. New and enlarging right pulmonary nodules, although still below sensitive PET-CT size thresholds. 5. Enlarging cystic lesion of the tip of the pancreatic tail, not currently hypermetabolic, potentially a postinflammatory cyst or IPMN, merit surveillance. 6. Other imaging findings of potential  clinical significance: Aortic Atherosclerosis (ICD10-I70.0). Sigmoid diverticulosis. Mild cardiomegaly.   Electronically Signed   By: Van Clines M.D.   On: 01/12/2017 11:57 I personally reviewed the PET/CT images and concur with the findings noted above  Impression: [name] is an 84 year old man with a history of tobacco abuse and a stage IA non-small cell carcinoma of the left lower lobe who presents with general malaise and weight loss.  He does have some shortness of breath on exertion but it is not severe.  He denies any significant cough or hemoptysis.  He does not feel there is been any recent dramatic change in his respiratory status.  A PET/CT shows a left hilar mass with opacification endobronchially, pleural thickening and nodular irregularity as well as a small effusion.  This is highly concerning for recurrent cancer.  I had a long discussion with Mr. and Mrs. Bandel and reviewed the PET/CT images with them.  I recommended that we proceed with bronchoscopy for endobronchial biopsy and possible left VATS for pleural biopsy.  I suspect that bronchoscopy will give [place] an answer based on the CT images, but the pleural biopsy would be done if bronchoscopy is not diagnostic.  [name]  does not want to consider redo VATS at this point and is not sure he would want to undergo that for any reason.  Therefore at this point he will only consent to a bronchoscopy.  They understand that this is a diagnostic procedure.  If possible hopefully we can debride enough tumor to reopen the airway which could help reexpand the left lung, but there is no guarantee we will be able to do so.  Secondly there is no guarantee that reopening the bronchus will make much of a difference due to the extent of pleural disease.  I described the general nature of the procedure.  He has had this procedure before and is familiar with it.  We would do it in the operating room under general anesthesia, on an outpatient basis.  I informed him of the indications, risks, benefits, and alternatives.  He understands the risks include, but are not limited to death, MI, stroke, bleeding, pneumothorax, failure to make a diagnosis, and DVT or PE.  They also understand there is a possibility of other unforeseeable complications.  He accepts the risks and agrees to proceed.  Atrial fibrillation-he has stopped the rivaroxaban due to bleeding from an anal fissure.  Protein calorie malnutrition-appears severe.  Plan: Bronchoscopy on Thursday, 02/16/2017  I spent 40 minutes with Mr. Mrs. Bunner during this visit Melrose Nakayama, MD [facility] [phone]

## 2017-02-13 NOTE — Progress Notes (Signed)
PCP is Lavone Orn, MD Referring Provider is Marin Olp Rudell Cobb, MD  Chief Complaint  Patient presents with  . Lung Cancer    Surgical Eval,  CT-guided aspiration of left pleural fluid 01/25/17, PET Scan 01/12/2017, HX of Pancreas CA and Lung CA    HPI: [name] is an 84 year old man sent for consultation regarding volume loss and possible tumor in the left chest.  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  In 2016 he had chest x-rays which showed a questionable right lung nodule and a CT of the chest showed soft tissue density around his previous staple line and possible slight enlargement of the left hilar node.  These areas were mildly hypermetabolic on PET/CT.  I did a bronchoscopy and endobronchial ultrasound which showed only inflammation.  We decided to continue to follow him radiographically.  Recently had a repeat PET/CT which showed progressive volume loss on the left side with likely endobronchial tumor and probable pleural tumor with a small pleural effusion.  He had a CT-guided aspiration of the pleural fluid and 400 mL was drained.  Cytologies were negative.  He has been feeling poorly.  He complains of decreased energy.  He is lost about 10 pounds over the past 6 months and 5 pounds over the past 3 months.  He complains of dizzy spells.  He has chronic atrial fibrillation.  He stopped his rivaroxaban about a month ago due to problems with bleeding from an anal fissure. Zubrod Score: At the time of surgery this patient's most appropriate activity status/level should be described as: []     0    Normal activity, no symptoms []     1    Restricted in physical  strenuous activity but ambulatory, able to do out light work [x]     2    Ambulatory and capable of self care, unable to do work activities, up and about >50 % of waking hours                              []     3    Only limited self care, in bed greater than 50% of waking hours []     4    Completely disabled, no self care, confined to bed or chair []     5    Moribund   Past Medical History:  Diagnosis Date  . Cancer of ampulla of Vater ([place]) 01/27/2013  . Diabetes mellitus   . H/O echocardiogram 2003,2009,2010, 2012  . Hypertension   . Iron deficiency anemia, unspecified 01/27/2013  . Lung cancer ([place]) dx'd 02/2009   surg only  . Lung cancer, lower lobe ([place]) 01/27/2013  . pancreatic ca dx'd 2004   oral chemo/xrt comp    Past Surgical History:  Procedure Laterality Date  . VIDEO BRONCHOSCOPY WITH ENDOBRONCHIAL ULTRASOUND N/A 10/20/2014   Procedure: VIDEO BRONCHOSCOPY WITH ENDOBRONCHIAL ULTRASOUND;  Surgeon: Melrose Nakayama, MD;  Location: [facility] OR;  Service: Thoracic;  Laterality: N/A;    Family History  Problem Relation Age of Onset  . Emphysema Father   . Lung cancer Brother     Social History Social History  Tobacco Use  . Smoking status: Former Smoker    Packs/day: 2.00    Years: 7.00    Pack years: 14.00    Types: Cigarettes    Start date: 09/30/1950    Last attempt to quit: 04/29/1957    Years since quitting: 59.8  . Smokeless tobacco: Never Used  . Tobacco comment: quit 54 years ago  Substance Use Topics  . Alcohol use: Not on file  . Drug use: Not on file    Current Outpatient Medications  Medication Sig Dispense Refill  . atorvastatin (LIPITOR) 10 MG tablet Take 10 mg by mouth 2 (two) times a week. Monday & Friday.    . diltiazem (CARDIZEM) 60 MG tablet Take 60 mg by mouth 3 (three) times daily as needed (for heart palpitations).     Marland Kitchen diltiazem (DILACOR XR) 240 MG 24 hr capsule Take 240 mg by mouth daily.    Marland Kitchen glimepiride (AMARYL) 2 MG tablet Take 2  mg by mouth daily before breakfast.    . levothyroxine (SYNTHROID, LEVOTHROID) 100 MCG tablet Take 100 mcg by mouth daily before breakfast.     . metFORMIN (GLUCOPHAGE-XR) 500 MG 24 hr tablet Take 500-1,000 mg by mouth 2 (two) times daily. Take 2 tablets (1000 mg) in the morning, and 1 tablet (500 mg) in the evening.    . Rivaroxaban (XARELTO) 15 MG TABS tablet Take 1 tablet (15 mg total) by mouth daily with supper. 90 tablet 1  . zolpidem (AMBIEN) 5 MG tablet Take 5 mg by mouth at bedtime as needed for sleep.      No current facility-administered medications for this visit.     Allergies  Allergen Reactions  . Promethazine Other (See Comments)    Makes hyper    Review of Systems  Constitutional: Positive for activity change, fatigue and unexpected weight change.  HENT: Negative for trouble swallowing and voice change.   Eyes: Negative for visual disturbance.  Respiratory: Positive for shortness of breath (With exertion). Negative for cough.   Cardiovascular: Negative for chest pain and leg swelling.       Atrial fibrillation  Gastrointestinal: Positive for blood in stool and diarrhea. Negative for abdominal pain.  Genitourinary: Negative for difficulty urinating and dysuria.  Musculoskeletal: Positive for arthralgias (Left shoulder).  Neurological: Positive for dizziness.  Hematological: Negative for adenopathy. Does not bruise/bleed easily.  All other systems reviewed and are negative.   BP (!) 157/109 (BP Location: Right Arm, Cuff Size: Normal)   Pulse (!) 127   Resp 18   Ht 5' 7.5" (1.715 m)   Wt 131 lb (59.4 kg)   SpO2 95% Comment: RA  BMI 20.21 kg/m  Physical Exam  Constitutional: He is oriented to person, place, and time. No distress.  Frail-appearing 84 year old man  HENT:  Head: Normocephalic and atraumatic.  Mouth/Throat: No oropharyngeal exudate.  Eyes: Conjunctivae and EOM are normal. No scleral icterus.  Neck: Neck supple. No thyromegaly present.   Cardiovascular: Normal rate, S1 normal and S2 normal. An irregularly irregular rhythm present.  No murmur heard. Pulmonary/Chest: Effort normal. No respiratory distress. He has no wheezes. He has no rales.  Markedly diminished breath sounds on left  Abdominal: Soft. He exhibits no distension. There is no tenderness.  Musculoskeletal: He exhibits no edema.  Lymphadenopathy:    He has no cervical adenopathy.  Neurological: He is alert and oriented to person, place, and time. No cranial nerve deficit. He exhibits normal muscle tone.  Skin: Skin is warm  and dry.  Vitals reviewed.    Diagnostic Tests: NUCLEAR MEDICINE PET SKULL BASE TO THIGH  TECHNIQUE: 7.0 mCi F-18 FDG was injected intravenously. Full-ring PET imaging was performed from the skull base to thigh after the radiotracer. CT data was obtained and used for attenuation correction and anatomic localization.  FASTING BLOOD GLUCOSE:  Value: 194 mg/dl  COMPARISON:  Multiple exams, including 09/22/2015  FINDINGS: NECK  No hypermetabolic lymph nodes in the neck.  CHEST  Extensive new left pleural thickening/ loculated pleural effusions with low-grade metabolic activity along the pleural thickening, SUV up to about 2.8. Complete plugging of the left tracheobronchial tree with left suprahilar, perihilar, and infrahilar airspace opacities and atelectasis especially in the left upper lobe. The associated retro hilar airspace opacity has a maximum SUV of up to about 3.3, formerly 5.6. There is shift of cardiac and mediastinal structures to the left.  New and enlarging right pulmonary nodules. Apical segment right upper lobe pulmonary nodule measures 0.9 by 0.6 cm on image 9/8, formerly 0.7 by 0.4 cm. A new nodule in the right upper lobe measures 5 mm in diameter on image 18/6. None of these nodules are currently perceptibly hypermetabolic but they are below sensitive PET-CT size thresholds.  Coronary, aortic  arch, and branch vessel atherosclerotic vascular disease. Mild cardiomegaly.  ABDOMEN/PELVIS  TheNo abnormal hypermetabolic activity within the liver, pancreas, adrenal glands, or spleen. No hypermetabolic lymph nodes in the abdomen or pelvis.  1.7 cm cystic lesion of the tip of the pancreatic tail is not hypermetabolic, and previously measured at most 0.7 cm. Prior Whipple procedure. Prominent stool throughout the colon favors constipation. Septated large left kidney lower pole cyst with some faint calcifications along the septation similar to prior. Other cysts are present and there is a small hyperdense lesion along the right posterior renal capsule which is probably a complex cyst. Non rotated right kidney.  Aortoiliac atherosclerotic vascular disease. Sigmoid diverticulosis.  SKELETON  Chronic sclerosis of the left first, second, and third ribs without significant associated hypermetabolic adenopathy. Chronic sclerotic right sacral ala lesion on image 164/4 measures 2.2 cm in long axis, without associated accentuated metabolic activity. 1.6 cm sclerotic lesion in the posterior wall of the right acetabulum on image 185/4 is new compared 09/22/2015 and has faintly accentuated metabolic activity with maximum SUV 2.5, previously 1.6 in this same area. New sclerosis in the T4 vertebral body with faintly accentuated metabolic activity, maximum SUV 2.5.  IMPRESSION: 1. Complete plugging of the left tracheobronchial tree with volume loss in the left hemithorax and new loculated pleural effusion forming a rind around the remaining aerated lung. Low-grade metabolic activity within this pleural fluid SUV up to 2.8, nonspecific for malignancy. 2. The degree of left perihilar metabolic activity is mildly reduced compared to prior. This may simply represent evolutionary changes in the radiation pneumonitis. The amount of perihilar airspace opacity, however, is increased  compared to prior. 3. There are new sclerotic lesions for example in the T4 vertebra and right posterior acetabular wall, which are mildly hypermetabolic suspicious for malignancy. There is also some new increased sclerosis anterolaterally in the left third rib in addition to the stable irregular sclerosis in the left first and second ribs. Suspected progression of osseous metastatic disease. 4. New and enlarging right pulmonary nodules, although still below sensitive PET-CT size thresholds. 5. Enlarging cystic lesion of the tip of the pancreatic tail, not currently hypermetabolic, potentially a postinflammatory cyst or IPMN, merit surveillance. 6. Other imaging findings of potential  clinical significance: Aortic Atherosclerosis (ICD10-I70.0). Sigmoid diverticulosis. Mild cardiomegaly.   Electronically Signed   By: Van Clines M.D.   On: 01/12/2017 11:57 I personally reviewed the PET/CT images and concur with the findings noted above  Impression: [name] is an 84 year old man with a history of tobacco abuse and a stage IA non-small cell carcinoma of the left lower lobe who presents with general malaise and weight loss.  He does have some shortness of breath on exertion but it is not severe.  He denies any significant cough or hemoptysis.  He does not feel there is been any recent dramatic change in his respiratory status.  A PET/CT shows a left hilar mass with opacification endobronchially, pleural thickening and nodular irregularity as well as a small effusion.  This is highly concerning for recurrent cancer.  I had a long discussion with Mr. and Mrs. Heatherly and reviewed the PET/CT images with them.  I recommended that we proceed with bronchoscopy for endobronchial biopsy and possible left VATS for pleural biopsy.  I suspect that bronchoscopy will give [place] an answer based on the CT images, but the pleural biopsy would be done if bronchoscopy is not diagnostic.  [name]  does not want to consider redo VATS at this point and is not sure he would want to undergo that for any reason.  Therefore at this point he will only consent to a bronchoscopy.  They understand that this is a diagnostic procedure.  If possible hopefully we can debride enough tumor to reopen the airway which could help reexpand the left lung, but there is no guarantee we will be able to do so.  Secondly there is no guarantee that reopening the bronchus will make much of a difference due to the extent of pleural disease.  I described the general nature of the procedure.  He has had this procedure before and is familiar with it.  We would do it in the operating room under general anesthesia, on an outpatient basis.  I informed him of the indications, risks, benefits, and alternatives.  He understands the risks include, but are not limited to death, MI, stroke, bleeding, pneumothorax, failure to make a diagnosis, and DVT or PE.  They also understand there is a possibility of other unforeseeable complications.  He accepts the risks and agrees to proceed.  Atrial fibrillation-he has stopped the rivaroxaban due to bleeding from an anal fissure.  Protein calorie malnutrition-appears severe.  Plan: Bronchoscopy on Thursday, 02/16/2017  I spent 40 minutes with Mr. Mrs. Prehn during this visit Melrose Nakayama, MD [facility] [phone]

## 2017-02-15 ENCOUNTER — Other Ambulatory Visit: Payer: Self-pay

## 2017-02-15 ENCOUNTER — Encounter (HOSPITAL_COMMUNITY): Payer: Self-pay | Admitting: Emergency Medicine

## 2017-02-15 NOTE — Progress Notes (Signed)
Pt denies any acute cardiopulmonary issues. Pt denies having a cardiac cath. Pt under the care of Dr. Debara Pickett, Cardiology. Pt denies having recent labs. Pt stated last dose of Xarelto was " a month ago." Pt made aware to stop taking vitamins, fish oil and herbal medications. Do not take any NSAIDs ie: Ibuprofen, Advil, Naproxen (Aleve), Motrin, BC and Goody Powder. Pt made aware to check BG every 2 hours prior to arrival to hospital on DOS. Pt made aware to treat a BG < 70 with 4 ounces of apple  juice, wait 15 minutes after intervention to recheck BG, if BG remains < 70, call Short Stay unit to speak with a nurse. Pt verbalized understanding of all pre-op instructions.

## 2017-02-15 NOTE — Progress Notes (Signed)
Anesthesia Chart Review:  Pt is a same day work up.   Pt is an 84 year old male scheduled for video bronchoscopy on 02/16/2017 with Modesto Charon, MD  - PCP is Lavone Orn, MD - Oncologist is Burney Gauze, MD - Cardiologist is Lyman Bishop, MD. Last office visit 12/13/16  PMH includes:  Atrial fibrillation, HTN, DM, iron deficiency anemia, pancreatic cancer (s/p Whipple), lung cancer (s/p segmentectomy). Former smoker. BMI 20  Medications include: Lipitor, diltiazem, glimepiride, levothyroxine, metformin (pt stopped xarelto a month ago due to anal fissure bleeding)  Labs will be obtained day of surgery.   CXR 12/19/16:  1. Consolidation left lower lung. This may represent result of progression of tumor. 2. 1 cm nodule left upper lobe.  EKG 12/13/16: Atrial flutter with variable AV block  Echo 11/03/16:  - Left ventricle: The cavity size was normal. There was moderate concentric hypertrophy. Systolic function was normal. The estimated ejection fraction was in the range of 55% to 60%. Wall motion was normal; there were no regional wall motion abnormalities. - Aortic valve: Trileaflet; mildly thickened, mildly calcified leaflets. - Aorta: Aortic root dimension: 39 mm (ED). - Ascending aorta: The ascending aorta was mildly dilated. - Mitral valve: There was moderate regurgitation directed toward the septum. - Left atrium: The atrium was mildly dilated. - Pericardium, extracardiac: A trivial pericardial effusion was identified posterior to the heart.  If labs acceptable day of surgery, I anticipate pt can proceed with surgery as scheduled.   Willeen Cass, FNP-BC [facility]/Anesthesiology Phone: [phone] 02/15/2017 11:11 AM

## 2017-02-16 ENCOUNTER — Ambulatory Visit (HOSPITAL_COMMUNITY): Payer: Medicare Other | Admitting: Emergency Medicine

## 2017-02-16 ENCOUNTER — Encounter (HOSPITAL_COMMUNITY)
Admission: RE | Disposition: A | Discharge status: Home / Self Care | Payer: Self-pay | Source: Ambulatory Visit | Attending: Thoracic Surgery (Cardiothoracic Vascular Surgery)

## 2017-02-16 ENCOUNTER — Ambulatory Visit (HOSPITAL_COMMUNITY)
Admission: RE | Admit: 2017-02-16 | Discharge: 2017-02-16 | Disposition: A | Discharge status: Home / Self Care | Payer: Medicare Other | Source: Ambulatory Visit | Attending: Thoracic Surgery (Cardiothoracic Vascular Surgery) | Admitting: Thoracic Surgery (Cardiothoracic Vascular Surgery)

## 2017-02-16 ENCOUNTER — Ambulatory Visit (HOSPITAL_COMMUNITY): Payer: Medicare Other

## 2017-02-16 ENCOUNTER — Encounter (HOSPITAL_COMMUNITY): Payer: Self-pay

## 2017-02-16 ENCOUNTER — Telehealth: Payer: Self-pay | Admitting: *Deleted

## 2017-02-16 DIAGNOSIS — I10 Essential (primary) hypertension: Secondary | ICD-10-CM | POA: Insufficient documentation

## 2017-02-16 DIAGNOSIS — E119 Type 2 diabetes mellitus without complications: Secondary | ICD-10-CM | POA: Diagnosis not present

## 2017-02-16 DIAGNOSIS — R918 Other nonspecific abnormal finding of lung field: Secondary | ICD-10-CM

## 2017-02-16 DIAGNOSIS — J90 Pleural effusion, not elsewhere classified: Secondary | ICD-10-CM | POA: Insufficient documentation

## 2017-02-16 DIAGNOSIS — Z7901 Long term (current) use of anticoagulants: Secondary | ICD-10-CM | POA: Diagnosis not present

## 2017-02-16 DIAGNOSIS — E46 Unspecified protein-calorie malnutrition: Secondary | ICD-10-CM | POA: Insufficient documentation

## 2017-02-16 DIAGNOSIS — Z85118 Personal history of other malignant neoplasm of bronchus and lung: Secondary | ICD-10-CM | POA: Insufficient documentation

## 2017-02-16 DIAGNOSIS — C3402 Malignant neoplasm of left main bronchus: Secondary | ICD-10-CM | POA: Insufficient documentation

## 2017-02-16 DIAGNOSIS — I700 Atherosclerosis of aorta: Secondary | ICD-10-CM | POA: Diagnosis not present

## 2017-02-16 DIAGNOSIS — K5730 Diverticulosis of large intestine without perforation or abscess without bleeding: Secondary | ICD-10-CM | POA: Insufficient documentation

## 2017-02-16 DIAGNOSIS — Z87891 Personal history of nicotine dependence: Secondary | ICD-10-CM | POA: Insufficient documentation

## 2017-02-16 DIAGNOSIS — I482 Chronic atrial fibrillation: Secondary | ICD-10-CM | POA: Diagnosis not present

## 2017-02-16 DIAGNOSIS — Z7984 Long term (current) use of oral hypoglycemic drugs: Secondary | ICD-10-CM | POA: Insufficient documentation

## 2017-02-16 DIAGNOSIS — Z90411 Acquired partial absence of pancreas: Secondary | ICD-10-CM | POA: Diagnosis not present

## 2017-02-16 DIAGNOSIS — Z923 Personal history of irradiation: Secondary | ICD-10-CM | POA: Insufficient documentation

## 2017-02-16 DIAGNOSIS — Z801 Family history of malignant neoplasm of trachea, bronchus and lung: Secondary | ICD-10-CM | POA: Diagnosis not present

## 2017-02-16 DIAGNOSIS — Z79899 Other long term (current) drug therapy: Secondary | ICD-10-CM | POA: Diagnosis not present

## 2017-02-16 DIAGNOSIS — K602 Anal fissure, unspecified: Secondary | ICD-10-CM | POA: Diagnosis not present

## 2017-02-16 DIAGNOSIS — Z6820 Body mass index (BMI) 20.0-20.9, adult: Secondary | ICD-10-CM | POA: Insufficient documentation

## 2017-02-16 DIAGNOSIS — Z888 Allergy status to other drugs, medicaments and biological substances status: Secondary | ICD-10-CM | POA: Insufficient documentation

## 2017-02-16 DIAGNOSIS — E039 Hypothyroidism, unspecified: Secondary | ICD-10-CM | POA: Diagnosis not present

## 2017-02-16 DIAGNOSIS — Z8507 Personal history of malignant neoplasm of pancreas: Secondary | ICD-10-CM | POA: Diagnosis not present

## 2017-02-16 DIAGNOSIS — Z9221 Personal history of antineoplastic chemotherapy: Secondary | ICD-10-CM | POA: Diagnosis not present

## 2017-02-16 DIAGNOSIS — R222 Localized swelling, mass and lump, trunk: Secondary | ICD-10-CM | POA: Diagnosis not present

## 2017-02-16 HISTORY — DX: Other nonspecific abnormal finding of lung field: R91.8

## 2017-02-16 HISTORY — PX: VIDEO BRONCHOSCOPY: SHX5072

## 2017-02-16 HISTORY — DX: Hypothyroidism, unspecified: E03.9

## 2017-02-16 HISTORY — DX: Unspecified atrial fibrillation: I48.91

## 2017-02-16 HISTORY — DX: Presence of spectacles and contact lenses: Z97.3

## 2017-02-16 LAB — GLUCOSE, CAPILLARY
GLUCOSE-CAPILLARY: 207 mg/dL — AB (ref 65–99)
Glucose-Capillary: 204 mg/dL — ABNORMAL HIGH (ref 65–99)

## 2017-02-16 LAB — CBC
HEMATOCRIT: 40.8 % (ref 39.0–52.0)
HEMOGLOBIN: 13.7 g/dL (ref 13.0–17.0)
MCH: 30.2 pg (ref 26.0–34.0)
MCHC: 33.6 g/dL (ref 30.0–36.0)
MCV: 90.1 fL (ref 78.0–100.0)
Platelets: 316 10*3/uL (ref 150–400)
RBC: 4.53 MIL/uL (ref 4.22–5.81)
RDW: 14.3 % (ref 11.5–15.5)
WBC: 6.7 10*3/uL (ref 4.0–10.5)

## 2017-02-16 LAB — POCT I-STAT 4, (NA,K, GLUC, HGB,HCT)
Glucose, Bld: 234 mg/dL — ABNORMAL HIGH (ref 65–99)
HCT: 35 % — ABNORMAL LOW (ref 39.0–52.0)
Hemoglobin: 11.9 g/dL — ABNORMAL LOW (ref 13.0–17.0)
Potassium: 3 mmol/L — ABNORMAL LOW (ref 3.5–5.1)
Sodium: 139 mmol/L (ref 135–145)

## 2017-02-16 LAB — HEMOGLOBIN A1C
Hgb A1c MFr Bld: 9.8 % — ABNORMAL HIGH (ref 4.8–5.6)
MEAN PLASMA GLUCOSE: 234.56 mg/dL

## 2017-02-16 SURGERY — BRONCHOSCOPY, VIDEO-ASSISTED
Anesthesia: General

## 2017-02-16 MED ORDER — EPINEPHRINE PF 1 MG/ML IJ SOLN
INTRAMUSCULAR | Status: AC
  Filled 2017-02-16: qty 1

## 2017-02-16 MED ORDER — LABETALOL HCL 5 MG/ML IV SOLN
20.0000 mg | INTRAVENOUS | Status: DC | PRN
  Administered 2017-02-16: 5 mg via INTRAVENOUS

## 2017-02-16 MED ORDER — FENTANYL CITRATE (PF) 250 MCG/5ML IJ SOLN
INTRAMUSCULAR | Status: AC
  Filled 2017-02-16: qty 5

## 2017-02-16 MED ORDER — FENTANYL CITRATE (PF) 100 MCG/2ML IJ SOLN: 25.0000 ug | INTRAMUSCULAR | Status: DC | PRN

## 2017-02-16 MED ORDER — DEXAMETHASONE SODIUM PHOSPHATE 10 MG/ML IJ SOLN
INTRAMUSCULAR | Status: DC | PRN
  Administered 2017-02-16: 10 mg via INTRAVENOUS

## 2017-02-16 MED ORDER — FENTANYL CITRATE (PF) 100 MCG/2ML IJ SOLN
INTRAMUSCULAR | Status: DC | PRN
  Administered 2017-02-16 (×3): 25 ug via INTRAVENOUS

## 2017-02-16 MED ORDER — EPINEPHRINE PF 1 MG/ML IJ SOLN
INTRAMUSCULAR | Status: DC | PRN
  Administered 2017-02-16: 1 mg

## 2017-02-16 MED ORDER — LIDOCAINE 2% (20 MG/ML) 5 ML SYRINGE
INTRAMUSCULAR | Status: DC | PRN
  Administered 2017-02-16: 40 mg via INTRAVENOUS
  Administered 2017-02-16: 60 mg via INTRAVENOUS

## 2017-02-16 MED ORDER — ONDANSETRON HCL 4 MG/2ML IJ SOLN
INTRAMUSCULAR | Status: DC | PRN
  Administered 2017-02-16: 4 mg via INTRAVENOUS

## 2017-02-16 MED ORDER — PROPOFOL 10 MG/ML IV BOLUS
INTRAVENOUS | Status: DC | PRN
  Administered 2017-02-16: 100 mg via INTRAVENOUS

## 2017-02-16 MED ORDER — LACTATED RINGERS IV SOLN
INTRAVENOUS | Status: DC
  Administered 2017-02-16: 10:00:00 via INTRAVENOUS

## 2017-02-16 MED ORDER — LABETALOL HCL 5 MG/ML IV SOLN
INTRAVENOUS | Status: AC
  Administered 2017-02-16: 5 mg via INTRAVENOUS
  Filled 2017-02-16: qty 4

## 2017-02-16 MED ORDER — ROCURONIUM BROMIDE 100 MG/10ML IV SOLN
INTRAVENOUS | Status: DC | PRN
  Administered 2017-02-16: 30 mg via INTRAVENOUS

## 2017-02-16 MED ORDER — 0.9 % SODIUM CHLORIDE (POUR BTL) OPTIME
TOPICAL | Status: DC | PRN
  Administered 2017-02-16: 1000 mL

## 2017-02-16 MED ORDER — LABETALOL HCL 5 MG/ML IV SOLN
INTRAVENOUS | Status: DC | PRN
  Administered 2017-02-16 (×2): 5 mg via INTRAVENOUS
  Administered 2017-02-16: 10 mg via INTRAVENOUS

## 2017-02-16 MED ORDER — SUGAMMADEX SODIUM 200 MG/2ML IV SOLN
INTRAVENOUS | Status: DC | PRN
  Administered 2017-02-16: 120 mg via INTRAVENOUS

## 2017-02-16 MED ORDER — PHENYLEPHRINE 40 MCG/ML (10ML) SYRINGE FOR IV PUSH (FOR BLOOD PRESSURE SUPPORT)
PREFILLED_SYRINGE | INTRAVENOUS | Status: DC | PRN
  Administered 2017-02-16: 120 ug via INTRAVENOUS
  Administered 2017-02-16: 80 ug via INTRAVENOUS

## 2017-02-16 MED ORDER — SUGAMMADEX SODIUM 200 MG/2ML IV SOLN
INTRAVENOUS | Status: AC
  Filled 2017-02-16: qty 2

## 2017-02-16 MED ORDER — PHENYLEPHRINE HCL 10 MG/ML IJ SOLN
INTRAVENOUS | Status: DC | PRN
  Administered 2017-02-16: 10 ug/min via INTRAVENOUS

## 2017-02-16 MED ORDER — PROPOFOL 10 MG/ML IV BOLUS
INTRAVENOUS | Status: AC
  Filled 2017-02-16: qty 20

## 2017-02-16 SURGICAL SUPPLY — 32 items
BRUSH CYTOL CELLEBRITY 1.5X140 (MISCELLANEOUS) ×4 IMPLANT
CANISTER SUCT 3000ML PPV (MISCELLANEOUS) ×3 IMPLANT
CONT SPEC 4OZ CLIKSEAL STRL BL (MISCELLANEOUS) ×6 IMPLANT
COVER BACK TABLE 60X90IN (DRAPES) ×3 IMPLANT
FILTER STRAW FLUID ASPIR (MISCELLANEOUS) IMPLANT
FORCEPS BIOP RJ4 1.8 (CUTTING FORCEPS) ×2 IMPLANT
FORCEPS RADIAL JAW LRG 4 PULM (INSTRUMENTS) IMPLANT
GAUZE SPONGE 4X4 12PLY STRL (GAUZE/BANDAGES/DRESSINGS) ×3 IMPLANT
GLOVE BIOGEL PI IND STRL 6.5 (GLOVE) IMPLANT
GLOVE BIOGEL PI INDICATOR 6.5 (GLOVE) ×2
GLOVE SURG SIGNA 7.5 PF LTX (GLOVE) ×3 IMPLANT
GLOVE SURG SS PI 6.5 STRL IVOR (GLOVE) ×2 IMPLANT
GOWN STRL NON-REIN LRG LVL3 (GOWN DISPOSABLE) ×2 IMPLANT
GOWN STRL REUS W/ TWL LRG LVL3 (GOWN DISPOSABLE) ×1 IMPLANT
GOWN STRL REUS W/ TWL XL LVL3 (GOWN DISPOSABLE) ×1 IMPLANT
GOWN STRL REUS W/TWL LRG LVL3 (GOWN DISPOSABLE) ×3
GOWN STRL REUS W/TWL XL LVL3 (GOWN DISPOSABLE) ×3
KIT CLEAN ENDO COMPLIANCE (KITS) ×3 IMPLANT
KIT ROOM TURNOVER OR (KITS) ×3 IMPLANT
MARKER SKIN DUAL TIP RULER LAB (MISCELLANEOUS) ×3 IMPLANT
NS IRRIG 1000ML POUR BTL (IV SOLUTION) ×3 IMPLANT
OIL SILICONE PENTAX (PARTS (SERVICE/REPAIRS)) ×3 IMPLANT
PAD ARMBOARD 7.5X6 YLW CONV (MISCELLANEOUS) ×6 IMPLANT
RADIAL JAW LRG 4 PULMONARY (INSTRUMENTS) ×2
SYR 20ML ECCENTRIC (SYRINGE) ×8 IMPLANT
SYR 5ML LL (SYRINGE) ×3 IMPLANT
SYR 5ML LUER SLIP (SYRINGE) ×3 IMPLANT
TOWEL GREEN STERILE (TOWEL DISPOSABLE) ×3 IMPLANT
TOWEL GREEN STERILE FF (TOWEL DISPOSABLE) ×3 IMPLANT
TRAP SPECIMEN MUCOUS 40CC (MISCELLANEOUS) ×3 IMPLANT
TUBE CONNECTING 20'X1/4 (TUBING) ×1
TUBE CONNECTING 20X1/4 (TUBING) ×2 IMPLANT

## 2017-02-16 NOTE — Anesthesia Preprocedure Evaluation (Addendum)
Anesthesia Evaluation  Patient identified by MRN, date of birth, ID band Patient awake    Reviewed: Allergy & Precautions, H&P , NPO status , Patient's Chart, lab work & pertinent test results  Airway Mallampati: II  TM Distance: >3 FB Neck ROM: Full    Dental no notable dental hx. (+) Teeth Intact, Dental Advisory Given   Pulmonary neg pulmonary ROS, former smoker,    Pulmonary exam normal breath sounds clear to auscultation       Cardiovascular hypertension, Pt. on medications + dysrhythmias Atrial Fibrillation + Valvular Problems/Murmurs MR  Rhythm:Regular Rate:Normal     Neuro/Psych negative neurological ROS  negative psych ROS   GI/Hepatic negative GI ROS, Neg liver ROS,   Endo/Other  diabetes, Type 2, Oral Hypoglycemic AgentsHypothyroidism   Renal/GU negative Renal ROS  negative genitourinary   Musculoskeletal   Abdominal   Peds  Hematology negative hematology ROS (+) anemia ,   Anesthesia Other Findings   Reproductive/Obstetrics negative OB ROS                            Anesthesia Physical Anesthesia Plan  ASA: III  Anesthesia Plan: General   Post-op Pain Management:    Induction: Intravenous  PONV Risk Score and Plan: 3 and Ondansetron, Dexamethasone and Treatment may vary due to age or medical condition  Airway Management Planned: Oral ETT  Additional Equipment:   Intra-op Plan:   Post-operative Plan: Extubation in OR  Informed Consent: I have reviewed the patients History and Physical, chart, labs and discussed the procedure including the risks, benefits and alternatives for the proposed anesthesia with the patient or authorized representative who has indicated his/her understanding and acceptance.   Dental advisory given  Plan Discussed with: CRNA  Anesthesia Plan Comments:         Anesthesia Quick Evaluation

## 2017-02-16 NOTE — Anesthesia Postprocedure Evaluation (Signed)
Anesthesia Post Note  Patient: [name]  Procedure(s) Performed: VIDEO BRONCHOSCOPY (N/A )     Patient location during evaluation: PACU Anesthesia Type: General Level of consciousness: awake and alert Pain management: pain level controlled Vital Signs Assessment: post-procedure vital signs reviewed and stable Respiratory status: spontaneous breathing, nonlabored ventilation and respiratory function stable Cardiovascular status: blood pressure returned to baseline and stable Postop Assessment: no apparent nausea or vomiting Anesthetic complications: no    Last Vitals:  Vitals:   02/16/17 1304 02/16/17 1308  BP: (!) 135/99   Pulse: 92 97  Resp: 16 16  Temp:  36.6 C  SpO2: 97% 96%    Last Pain:  Vitals:   02/16/17 1308  TempSrc:   PainSc: 0-No pain                 Jolinda Pinkstaff,W. EDMOND

## 2017-02-16 NOTE — Brief Op Note (Signed)
02/16/2017  1:21 PM  PATIENT:  [name]  84 y.o. male  PRE-OPERATIVE DIAGNOSIS:  LEFT LUNG MASS  POST-OPERATIVE DIAGNOSIS:  LEFT LUNG MASS- malignant cells present  PROCEDURE:  Procedure(s): VIDEO BRONCHOSCOPY (N/A) with brushings and endobronchial biopies  SURGEON:  Surgeon(s) and Role:    * Melrose Nakayama, MD - Primary  PHYSICIAN ASSISTANT: none  ASSISTANTS: none   ANESTHESIA:   general  EBL:  0 mL   BLOOD ADMINISTERED:none  DRAINS: none   LOCAL MEDICATIONS USED:  NONE  SPECIMEN:  Source of Specimen:  Left mainstem bronchus  DISPOSITION OF SPECIMEN:  PATHOLOGY  COUNTS:  NO endoscopic  TOURNIQUET:  * No tourniquets in log *  DICTATION: .Other Dictation: Dictation Number -  PLAN OF CARE: Discharge to home after PACU  PATIENT DISPOSITION:  PACU - hemodynamically stable.   Delay start of Pharmacological VTE agent (>24hrs) due to surgical blood loss or risk of bleeding: not applicable

## 2017-02-16 NOTE — Telephone Encounter (Signed)
Received a call from patients wife stating that patient had his lung biopsy today which showed lung cancer.  Pathology report wouldn't be ready for a few days.  Wanted to make an appointment.  Spoke with Dr. Marin Olp.  Dr Marin Olp will wait for pathology and then order specific tests to determine treatment.  Gave patient this information.  She is in agreement and will follow up with [place] later.

## 2017-02-16 NOTE — Anesthesia Procedure Notes (Signed)
Procedure Name: Intubation Date/Time: 02/16/2017 11:21 AM Performed by: Roderic Palau, MD Pre-anesthesia Checklist: Patient identified, Emergency Drugs available, Suction available, Patient being monitored and Timeout performed Patient Re-evaluated:Patient Re-evaluated prior to induction Oxygen Delivery Method: Circle system utilized Preoxygenation: Pre-oxygenation with 100% oxygen Induction Type: IV induction Ventilation: Mask ventilation without difficulty Laryngoscope Size: Mac and 3 Grade View: Grade II Tube type: Oral Tube size: 8.0 mm Number of attempts: 1 Airway Equipment and Method: Stylet Placement Confirmation: ETT inserted through vocal cords under direct vision,  positive ETCO2,  CO2 detector and breath sounds checked- equal and bilateral Secured at: 21 cm Tube secured with: Tape Dental Injury: Teeth and Oropharynx as per pre-operative assessment

## 2017-02-16 NOTE — Transfer of Care (Signed)
Immediate Anesthesia Transfer of Care Note  Patient: [name]  Procedure(s) Performed: VIDEO BRONCHOSCOPY (N/A )  Patient Location: PACU  Anesthesia Type:General  Level of Consciousness: awake, alert , oriented and patient cooperative  Airway & Oxygen Therapy: Patient Spontanous Breathing and Patient connected to nasal cannula oxygen  Post-op Assessment: Report given to RN and Post -op Vital signs reviewed and stable  Post vital signs: Reviewed and stable  Last Vitals:  Vitals:   02/16/17 1038 02/16/17 1108  BP: (!) 185/131 (!) 180/119  Pulse: (!) 105 99  Resp:    Temp:    SpO2: 98%     Last Pain:  Vitals:   02/16/17 1019  TempSrc: Oral      Patients Stated Pain Goal: 3 (62/37/62 8315)  Complications: No apparent anesthesia complications

## 2017-02-16 NOTE — Discharge Instructions (Signed)
Do not drive or engage in heavy physical activity for 24 hours  You may resume normal activities tomorrow  You may cough up small amounts of blood over the next few days.  You may use acetaminophen (Tylenol) if needed for discomfort  Call [phone] if you develop chest pain, shortness of breath, fever > 101F or cough up more than 2 tablespoons of blood  Follow up with Dr. Marin Olp

## 2017-02-16 NOTE — Interval H&P Note (Signed)
History and Physical Interval Note:  02/16/2017 10:33 AM  [name]  has presented today for surgery, with the diagnosis of LEFT LUNG MASS  The various methods of treatment have been discussed with the patient and family. After consideration of risks, benefits and other options for treatment, the patient has consented to  Procedure(s): VIDEO BRONCHOSCOPY (N/A) as a surgical intervention .  The patient's history has been reviewed, patient examined, no change in status, stable for surgery.  I have reviewed the patient's chart and labs.  Questions were answered to the patient's satisfaction.     Melrose Nakayama

## 2017-02-17 ENCOUNTER — Encounter (HOSPITAL_COMMUNITY): Payer: Self-pay | Admitting: Thoracic Surgery (Cardiothoracic Vascular Surgery)

## 2017-02-17 NOTE — Op Note (Signed)
NAMEPJ, ZEHNER              ACCOUNT NO.:  [id number]  MEDICAL RECORD NO.:  [id number]  LOCATION:                                 FACILITY:  PHYSICIAN:  Revonda Standard. Roxan Hockey, M.D. DATE OF BIRTH:  DATE OF PROCEDURE:  02/16/2017 DATE OF DISCHARGE:                              OPERATIVE REPORT   PREOPERATIVE DIAGNOSIS:  Left lung mass.  POSTOPERATIVE DIAGNOSIS:  Left lung mass, malignant cells present.  PROCEDURE:  Bronchoscopy with brushings and endobronchial biopsy.  SURGEON:  Revonda Standard. Roxan Hockey, MD.  ASSISTANT:  None.  ANESTHESIA:  General.  FINDINGS:  Endobronchial and extrinsic compression on both left upper and left lower lobe bronchi origins with apparent tumor infiltration in the left mainstem and at the left carina.  Brushings showed malignant cells.  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  In 2016, he had chest x-rays, which showed a questionable right lung nodule.  CT chest showed soft tissue density around his previous staple line and enlargement of the left hilar node.  Bronchoscopy and endobronchial ultrasound showed only inflammation and he has been followed radiographically.  A recent PET/CT showed volume loss on the left side with probable endobronchial tumor as well as pleural tumor.  The patient was advised to undergo a biopsy procedure with options of VATS versus bronchoscopy were discussed with the patient.  He wished to pursue bronchoscopic biopsy.  The indications, risks, benefits, and alternatives were discussed in detail with the patient.  He accepted the risks and agreed to proceed.  DESCRIPTION OF PROCEDURE:  [name] was brought to the operating room on February 16, 2017.  He had induction of general anesthesia and was intubated.  After performing a time-out, flexible fiberoptic bronchoscopy was performed.  On the right side, there were  no endobronchial lesions seen.  On the left side in the distal left mainstem bronchus, there was abnormal appearance to the mucosa consistent with tumor infiltration.  There was extrinsic compression of both the upper and lower lobe bronchi at their origins.  There also was endobronchial tumor visible in both of these areas at their origins and extending into the distal left mainstem.  Dilute epinephrine was applied topically to help with bleeding.  Brushings were performed and sent for immediate examination while awaiting those results.  Multiple biopsies were obtained.  Endobronchial biopsies in both the left upper and left lower lobe did result in opening the origin of those airways and some mucus was expressed as that was done.  There was moderate bleeding with the biopsies.  The brushings returned showing malignant cells, but could not be further characterized. The biopsies were all sent for permanent pathology.  Final inspection was made with the bronchoscope. There was no ongoing bleeding.  The bronchoscope was removed.  The patient was extubated in the operating room and taken to the postanesthetic care unit in good condition.     Revonda Standard Roxan Hockey, M.D.     SCH/MEDQ  D:  02/16/2017  T:  02/17/2017  Job:  [id number]

## 2017-03-02 ENCOUNTER — Other Ambulatory Visit: Payer: Self-pay | Admitting: *Deleted

## 2017-03-02 ENCOUNTER — Encounter (HOSPITAL_COMMUNITY): Payer: Self-pay

## 2017-03-02 DIAGNOSIS — C3430 Malignant neoplasm of lower lobe, unspecified bronchus or lung: Secondary | ICD-10-CM

## 2017-03-03 ENCOUNTER — Inpatient Hospital Stay: Payer: Medicare Other

## 2017-03-03 ENCOUNTER — Encounter: Payer: Self-pay | Admitting: Hematology & Oncology

## 2017-03-03 ENCOUNTER — Inpatient Hospital Stay: Payer: Medicare Other | Attending: Hematology & Oncology | Admitting: Hematology & Oncology

## 2017-03-03 VITALS — BP 143/94 | HR 74 | Resp 16

## 2017-03-03 DIAGNOSIS — D509 Iron deficiency anemia, unspecified: Secondary | ICD-10-CM | POA: Diagnosis not present

## 2017-03-03 DIAGNOSIS — C782 Secondary malignant neoplasm of pleura: Secondary | ICD-10-CM | POA: Insufficient documentation

## 2017-03-03 DIAGNOSIS — C7951 Secondary malignant neoplasm of bone: Secondary | ICD-10-CM | POA: Insufficient documentation

## 2017-03-03 DIAGNOSIS — Z8509 Personal history of malignant neoplasm of other digestive organs: Secondary | ICD-10-CM

## 2017-03-03 DIAGNOSIS — C3492 Malignant neoplasm of unspecified part of left bronchus or lung: Secondary | ICD-10-CM | POA: Diagnosis present

## 2017-03-03 DIAGNOSIS — C3491 Malignant neoplasm of unspecified part of right bronchus or lung: Secondary | ICD-10-CM

## 2017-03-03 DIAGNOSIS — Z79899 Other long term (current) drug therapy: Secondary | ICD-10-CM | POA: Diagnosis not present

## 2017-03-03 DIAGNOSIS — C3430 Malignant neoplasm of lower lobe, unspecified bronchus or lung: Secondary | ICD-10-CM

## 2017-03-03 DIAGNOSIS — C7802 Secondary malignant neoplasm of left lung: Secondary | ICD-10-CM

## 2017-03-03 DIAGNOSIS — C259 Malignant neoplasm of pancreas, unspecified: Secondary | ICD-10-CM

## 2017-03-03 DIAGNOSIS — Z7189 Other specified counseling: Secondary | ICD-10-CM

## 2017-03-03 HISTORY — DX: Secondary malignant neoplasm of left lung: C78.02

## 2017-03-03 HISTORY — DX: Other specified counseling: Z71.89

## 2017-03-03 HISTORY — DX: Malignant neoplasm of unspecified part of right bronchus or lung: C34.91

## 2017-03-03 LAB — CMP (CANCER CENTER ONLY)
ALK PHOS: 104 U/L — AB (ref 26–84)
ALT: 18 U/L (ref 0–55)
ANION GAP: 10 (ref 5–15)
AST: 20 U/L (ref 5–34)
Albumin: 3.8 g/dL (ref 3.5–5.0)
BUN: 15 mg/dL (ref 7–22)
CALCIUM: 9.4 mg/dL (ref 8.0–10.3)
CO2: 30 mmol/L (ref 18–33)
Chloride: 101 mmol/L (ref 98–108)
Creatinine: 1 mg/dL (ref 0.70–1.30)
GLUCOSE: 364 mg/dL — AB (ref 70–118)
POTASSIUM: 4.5 mmol/L (ref 3.3–4.7)
SODIUM: 141 mmol/L (ref 128–145)
TOTAL PROTEIN: 6.8 g/dL (ref 6.4–8.1)
Total Bilirubin: 1 mg/dL (ref 0.2–1.2)

## 2017-03-03 LAB — CBC WITH DIFFERENTIAL (CANCER CENTER ONLY)
BASOS ABS: 0 10*3/uL (ref 0.0–0.1)
BASOS PCT: 1 %
EOS PCT: 2 %
Eosinophils Absolute: 0.2 10*3/uL (ref 0.0–0.5)
HCT: 38.8 % (ref 38.7–49.9)
Hemoglobin: 12.8 g/dL — ABNORMAL LOW (ref 13.0–17.1)
LYMPHS PCT: 11 %
Lymphs Abs: 0.9 10*3/uL (ref 0.9–3.3)
MCH: 31.2 pg (ref 28.0–33.4)
MCHC: 33 g/dL (ref 32.0–35.9)
MCV: 94.6 fL (ref 82.0–98.0)
MONO ABS: 0.9 10*3/uL (ref 0.1–0.9)
Monocytes Relative: 10 %
Neutro Abs: 6.4 10*3/uL (ref 1.5–6.5)
Neutrophils Relative %: 76 %
PLATELETS: 120 10*3/uL — AB (ref 140–400)
RBC: 4.1 MIL/uL — AB (ref 4.20–5.70)
RDW: 14 % (ref 11.1–15.7)
WBC: 8.4 10*3/uL (ref 4.0–10.3)

## 2017-03-03 MED ORDER — SODIUM CHLORIDE 0.9 % IV SOLN
Freq: Once | INTRAVENOUS | Status: AC
Start: 2017-03-03 — End: 2017-03-03
  Administered 2017-03-03: 12:00:00 via INTRAVENOUS

## 2017-03-03 MED ORDER — RIVAROXABAN 10 MG PO TABS: 10.0000 mg | ORAL_TABLET | Freq: Every day | ORAL | 3 refills | Status: AC

## 2017-03-03 MED ORDER — GLIMEPIRIDE 2 MG PO TABS: 4.0000 mg | ORAL_TABLET | Freq: Every day | ORAL | 4 refills | Status: DC

## 2017-03-03 MED ORDER — SODIUM CHLORIDE 0.9 % IV SOLN
510.0000 mg | Freq: Once | INTRAVENOUS | Status: AC
  Administered 2017-03-03: 510 mg via INTRAVENOUS
  Filled 2017-03-03: qty 17

## 2017-03-03 MED ORDER — OXANDROLONE 10 MG PO TABS: 10.0000 mg | ORAL_TABLET | Freq: Every day | ORAL | 3 refills | Status: DC

## 2017-03-03 MED ORDER — RIVAROXABAN 10 MG PO TABS: 10.0000 mg | ORAL_TABLET | Freq: Every day | ORAL | 3 refills | Status: DC

## 2017-03-03 NOTE — Progress Notes (Signed)
START ON PATHWAY REGIMEN - Non-Small Cell Lung     A cycle is every 21 days:     Pembrolizumab      Pemetrexed      Carboplatin   **Always confirm dose/schedule in your pharmacy ordering system**    Patient Characteristics: Stage IV Metastatic, Nonsquamous, Initial Chemotherapy/Immunotherapy, PS = 0, 1, PD-L1 Expression Positive 1-49% (TPS) / Negative / Not Tested / Awaiting Test Results AJCC T Category: TX Current Disease Status: Distant Metastases AJCC N Category: NX AJCC M Category: M1b AJCC 8 Stage Grouping: IVA Histology: Nonsquamous Cell ROS1 Rearrangement Status: Negative T790M Mutation Status: Not Applicable - EGFR Mutation Negative/Unknown Other Mutations/Biomarkers: No Other Actionable Mutations PD-L1 Expression Status: PD-L1 Positive 1-49% (TPS) Chemotherapy/Immunotherapy LOT: Initial Chemotherapy/Immunotherapy Molecular Targeted Therapy: Not Appropriate ALK Translocation Status: Negative Would you be surprised if this patient died  in the next year<= I would be surprised if this patient died in the next year EGFR Mutation Status: Non-Sensitizing BRAF V600E Mutation Status: Negative Performance Status: PS = 0, 1 Intent of Therapy: Non-Curative / Palliative Intent, Discussed with Patient

## 2017-03-03 NOTE — Progress Notes (Signed)
Hematology and Oncology Follow Up Visit  [name] [id number] 1933-03-26 84 y.o. 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 is back for follow-up.  Unfortunately, we now clearly have metastatic disease.  His CA 19-9 has been going up quickly.  Back in June it was a 23.  In December it was 3700.  We did a PET scan on him.  He had extensive new left pleural thickening and loculated effusions.  This had a low level metabolic activity.  He had metabolic activity in new and enlarging right pulmonary nodules.  He had activity at T4 vertebral body.  Also noted some activity in some left ribs.  He was seen by Dr. Roxan Hockey.  Roxan Hockey did a bronchoscopy on him.  This was done on February 16, 2017.  The pathology report ([id number]) showed adeno carcinoma.  This is taken from the left mainstem bronchus.  We then did genetic studies.  There were no actionable mutations that we could find.  The TMB was low.  There was a K-ras mutation.  He continues to lose weight.  He is having diarrhea.  He is on Synthroid.  I will check his thyroid levels to see if may be the Synthroid dose might be too much.  We will try him on some Oxandrin (10 mg po q day) to try to help with some weight gain.  He comes in with his wife.  I spent a good hour with them.  I have known them for over 15 years.  This clearly came from the small stage I adenocarcinoma that he had  resected from his left lung about 7 years ago.  He does not complain of any pain.  He is not noted any bleeding.  He stopped his Xarelto.  I really think he has to be on Xarelto.  We will put him on 10 mg daily.  When  he was last seen back in December, he did have some low iron studies.  I think we should give him some IV iron to try to help with his anemia.  I would have to say that overall, his performance status is ECOG 1.    Medications:  Current Outpatient Medications:  .  atorvastatin (LIPITOR) 10 MG tablet, Take 10 mg by mouth 2 (two) times a week. Monday & Friday., Disp: , Rfl:  .  diltiazem (CARDIZEM) 60 MG tablet, Take 60 mg by mouth 3 (three) times daily as needed (for heart palpitations). , Disp: , Rfl:  .  diltiazem (DILACOR XR) 240 MG 24 hr capsule, Take 240 mg by mouth daily., Disp: , Rfl:  .  glimepiride (AMARYL) 2 MG tablet, Take 2 tablets (4 mg total) by mouth daily before breakfast., Disp: 60 tablet, Rfl: 4 .  levothyroxine (SYNTHROID, LEVOTHROID) 100 MCG tablet, Take 100 mcg by mouth daily before breakfast. , Disp: , Rfl:  .  metFORMIN (GLUCOPHAGE-XR) 500 MG 24 hr tablet, Take 500-1,000 mg by mouth 2 (two) times daily. Take 2 tablets (1000 mg) in the morning, and 1 tablet (500 mg) in the evening., Disp: , Rfl:  .  oxandrolone (OXANDRIN) 10 MG tablet, Take 1 tablet (10 mg total) by mouth daily after breakfast.,  Disp: 30 tablet, Rfl: 3 .  rivaroxaban (XARELTO) 10 MG TABS tablet, Take 1 tablet (10 mg total) by mouth daily with supper., Disp: 30 tablet, Rfl: 3 .  zolpidem (AMBIEN) 5 MG tablet, Take 5 mg by mouth at bedtime as needed for sleep. , Disp: , Rfl:   Allergies:  Allergies  Allergen Reactions  . Promethazine Other (See Comments)    Makes hyper    Past Medical History, Surgical history, Social history, and Family History were reviewed and updated.  Review of Systems: Review of Systems  Constitutional: Positive for appetite change, fatigue and unexpected weight change.  HENT:  Negative.   Eyes: Negative.   Respiratory: Positive for shortness of breath and wheezing.   Cardiovascular: Positive for palpitations.  Gastrointestinal: Positive for diarrhea and nausea.  Endocrine:  Negative.   Genitourinary: Negative.    Musculoskeletal: Positive for myalgias.  Skin: Negative.   Neurological: Negative.   Hematological: Negative.   Psychiatric/Behavioral: Negative.     Physical Exam:  weight is 130 lb (59 kg). His oral temperature is 97.6 F (36.4 C). His blood pressure is 143/104 (abnormal) and his pulse is 129 (abnormal). His respiration is 18 and oxygen saturation is 97%.   Wt Readings from Last 3 Encounters:  03/03/17 130 lb (59 kg)  02/16/17 131 lb (59.4 kg)  02/13/17 131 lb (59.4 kg)    Physical Exam  Constitutional: He is oriented to person, place, and time.  HENT:  Head: Normocephalic and atraumatic.  Mouth/Throat: Oropharynx is clear and moist.  Eyes: EOM are normal. Pupils are equal, round, and reactive to light.  Neck: Normal range of motion.  Cardiovascular: Normal rate, regular rhythm and normal heart sounds.  Pulmonary/Chest: Effort normal and breath sounds normal.  Abdominal: Soft. Bowel sounds are normal.  Musculoskeletal: Normal range of motion. He exhibits no edema, tenderness or deformity.  Lymphadenopathy:    He has no cervical adenopathy.  Neurological: He is alert and oriented to person, place, and time.  Skin: Skin is warm and dry. No rash noted. No erythema.  Psychiatric: He has a normal mood and affect. His behavior is normal. Judgment and thought content normal.  Vitals reviewed.    Lab Results  Component Value Date   WBC 8.4 03/03/2017   HGB 11.9 (L) 02/16/2017   HCT 38.8 03/03/2017   MCV 94.6 03/03/2017   PLT 120 (L) 03/03/2017     Chemistry      Component Value Date/Time   NA 141 03/03/2017 1013   NA 142 01/13/2017 1200   NA 141 05/10/2016 1101   K 4.5 03/03/2017 1013   K 3.4 01/13/2017 1200   K 4.4 05/10/2016 1101   CL 101 03/03/2017 1013   CL 99 01/13/2017 1200   CO2 30 03/03/2017 1013   CO2 32 01/13/2017 1200   CO2 30 (H) 05/10/2016 1101   BUN 15 03/03/2017 1013   BUN 13 01/13/2017 1200   BUN 17.6  05/10/2016 1101   CREATININE 1.3 (H) 01/13/2017 1200   CREATININE 1.1 05/10/2016 1101      Component Value Date/Time   CALCIUM 9.4 03/03/2017 1013   CALCIUM 9.4 01/13/2017 1200   CALCIUM 9.8 05/10/2016 1101   ALKPHOS 104 (H) 03/03/2017 1013   ALKPHOS 81 01/13/2017 1200   ALKPHOS 79 05/10/2016 1101   AST 20 03/03/2017 1013   AST 14 05/10/2016 1101   ALT 18 03/03/2017 1013   ALT 22 01/13/2017 1200   ALT 11 05/10/2016 1101  BILITOT 1.0 03/03/2017 1013   BILITOT 0.53 05/10/2016 1101         Impression and Plan: [name] is an 84 year old white male who has metastatic adenocarcinoma of the lung.  This is an aggressive disease.  I talked to he and his wife for about an hour.  I explained to them that what he has is treatable but definitely not curable.  He really did not want to know what the prognosis was.  However, I did tell him that if he did not want to take any treatment or if treatment did not work, then I would not think he would be here for [facility].  I think what shows me this is his weight loss.  This is quite obvious.  If he continues to lose weight, he is just not going to do well.  He wants to try treatment.  His wife clearly wants him to try treatment.  We will try him on dose reduced carboplatinum/Alimta/Keytruda.  I think this would be considered front line therapy for adenocarcinoma of the lung without an actionable mutation.  He does not need to have a Port-A-Cath.  He has pretty decent peripheral IV access.  I went over the side effects.  I gave them information sheets about each chemotherapy drug.  He will participate in the chemotherapy education class.  We would do 2 cycles of treatment and then I would repeat his PET scan and see how everything looks.  I really think that he will respond.  Again, he knows that a response does not mean cure.  We had to be careful with his blood sugars.  It would not surprise me if he needed to be transfused.  He  will get his vitamin B12 shot today.  For right now, we will try to get started next week.  I just feel bad for him.  I know that he has done everything we have asked him to do.  This tumor has been incredibly difficult to identify.  Despite his CA 19-9 going up, we have never been able to actually find cancer that has returned.  I will plan to see him back in 4 weeks, which would be the day of his second cycle of treatment.   [name], MD 1/25/20194:16 PM

## 2017-03-04 LAB — T4: T4, Total: 7.3 ug/dL (ref 4.5–12.0)

## 2017-03-06 LAB — TSH: TSH: 2.435 u[IU]/mL (ref 0.320–4.118)

## 2017-03-08 ENCOUNTER — Encounter: Payer: Self-pay | Admitting: *Deleted

## 2017-03-10 ENCOUNTER — Encounter: Payer: Self-pay | Admitting: Family

## 2017-03-13 ENCOUNTER — Inpatient Hospital Stay: Payer: Medicare Other | Attending: Hematology & Oncology

## 2017-03-13 ENCOUNTER — Other Ambulatory Visit: Payer: Self-pay | Admitting: Family

## 2017-03-13 ENCOUNTER — Other Ambulatory Visit: Payer: Self-pay | Admitting: *Deleted

## 2017-03-13 DIAGNOSIS — Z79899 Other long term (current) drug therapy: Secondary | ICD-10-CM | POA: Insufficient documentation

## 2017-03-13 DIAGNOSIS — C3492 Malignant neoplasm of unspecified part of left bronchus or lung: Secondary | ICD-10-CM | POA: Insufficient documentation

## 2017-03-13 DIAGNOSIS — Z5111 Encounter for antineoplastic chemotherapy: Secondary | ICD-10-CM | POA: Insufficient documentation

## 2017-03-13 DIAGNOSIS — Z5112 Encounter for antineoplastic immunotherapy: Secondary | ICD-10-CM | POA: Insufficient documentation

## 2017-03-13 DIAGNOSIS — C7951 Secondary malignant neoplasm of bone: Secondary | ICD-10-CM | POA: Insufficient documentation

## 2017-03-14 ENCOUNTER — Inpatient Hospital Stay: Payer: Medicare Other

## 2017-03-14 ENCOUNTER — Other Ambulatory Visit: Payer: Self-pay | Admitting: *Deleted

## 2017-03-14 VITALS — BP 148/105 | HR 116 | Temp 97.7°F | Resp 16

## 2017-03-14 DIAGNOSIS — Z7189 Other specified counseling: Secondary | ICD-10-CM

## 2017-03-14 DIAGNOSIS — Z79899 Other long term (current) drug therapy: Secondary | ICD-10-CM | POA: Diagnosis not present

## 2017-03-14 DIAGNOSIS — C3430 Malignant neoplasm of lower lobe, unspecified bronchus or lung: Secondary | ICD-10-CM

## 2017-03-14 DIAGNOSIS — C3492 Malignant neoplasm of unspecified part of left bronchus or lung: Secondary | ICD-10-CM | POA: Diagnosis present

## 2017-03-14 DIAGNOSIS — Z5112 Encounter for antineoplastic immunotherapy: Secondary | ICD-10-CM | POA: Diagnosis not present

## 2017-03-14 DIAGNOSIS — C3491 Malignant neoplasm of unspecified part of right bronchus or lung: Secondary | ICD-10-CM

## 2017-03-14 DIAGNOSIS — Z5111 Encounter for antineoplastic chemotherapy: Secondary | ICD-10-CM | POA: Diagnosis present

## 2017-03-14 DIAGNOSIS — C259 Malignant neoplasm of pancreas, unspecified: Secondary | ICD-10-CM

## 2017-03-14 DIAGNOSIS — C7951 Secondary malignant neoplasm of bone: Secondary | ICD-10-CM | POA: Diagnosis not present

## 2017-03-14 DIAGNOSIS — C7802 Secondary malignant neoplasm of left lung: Secondary | ICD-10-CM

## 2017-03-14 LAB — CMP (CANCER CENTER ONLY)
ALK PHOS: 115 U/L — AB (ref 26–84)
ALT: 22 U/L (ref 0–55)
ANION GAP: 9 (ref 5–15)
AST: 24 U/L (ref 5–34)
Albumin: 3.9 g/dL (ref 3.5–5.0)
BUN: 14 mg/dL (ref 7–22)
CALCIUM: 9.5 mg/dL (ref 8.0–10.3)
CO2: 31 mmol/L (ref 18–33)
Chloride: 96 mmol/L — ABNORMAL LOW (ref 98–108)
Creatinine: 1.2 mg/dL (ref 0.70–1.30)
Glucose, Bld: 310 mg/dL — ABNORMAL HIGH (ref 73–118)
POTASSIUM: 3.7 mmol/L (ref 3.3–4.7)
Sodium: 136 mmol/L (ref 128–145)
Total Bilirubin: 1 mg/dL (ref 0.2–1.2)
Total Protein: 6.9 g/dL (ref 6.4–8.1)

## 2017-03-14 LAB — IRON AND TIBC
IRON: 38 ug/dL — AB (ref 42–163)
Saturation Ratios: 17 % — ABNORMAL LOW (ref 42–163)
TIBC: 220 ug/dL (ref 202–409)
UIBC: 183 ug/dL

## 2017-03-14 LAB — CBC WITH DIFFERENTIAL (CANCER CENTER ONLY)
Basophils Absolute: 0 10*3/uL (ref 0.0–0.1)
Basophils Relative: 0 %
Eosinophils Absolute: 0 10*3/uL (ref 0.0–0.5)
Eosinophils Relative: 1 %
HEMATOCRIT: 39.5 % (ref 38.7–49.9)
HEMOGLOBIN: 13.3 g/dL (ref 13.0–17.1)
LYMPHS ABS: 0.5 10*3/uL — AB (ref 0.9–3.3)
LYMPHS PCT: 9 %
MCH: 31.7 pg (ref 28.0–33.4)
MCHC: 33.7 g/dL (ref 32.0–35.9)
MCV: 94 fL (ref 82.0–98.0)
Monocytes Absolute: 0.7 10*3/uL (ref 0.1–0.9)
Monocytes Relative: 13 %
NEUTROS ABS: 4.2 10*3/uL (ref 1.5–6.5)
NEUTROS PCT: 77 %
Platelet Count: 107 10*3/uL — ABNORMAL LOW (ref 145–400)
RBC: 4.2 MIL/uL (ref 4.20–5.70)
RDW: 14 % (ref 11.1–15.7)
WBC Count: 5.4 10*3/uL (ref 4.0–10.0)

## 2017-03-14 LAB — LACTATE DEHYDROGENASE: LDH: 196 U/L (ref 125–245)

## 2017-03-14 LAB — TSH: TSH: 3.238 u[IU]/mL (ref 0.320–4.118)

## 2017-03-14 LAB — FERRITIN: Ferritin: 603 ng/mL — ABNORMAL HIGH (ref 22–316)

## 2017-03-14 MED ORDER — PROCHLORPERAZINE 25 MG RE SUPP: 25.0000 mg | Freq: Two times a day (BID) | RECTAL | 3 refills | Status: DC | PRN

## 2017-03-14 MED ORDER — DEXAMETHASONE 4 MG PO TABS: ORAL_TABLET | ORAL | 1 refills | Status: DC

## 2017-03-14 MED ORDER — SODIUM CHLORIDE 0.9 % IV SOLN
Freq: Once | INTRAVENOUS | Status: AC
  Administered 2017-03-14: 12:00:00 via INTRAVENOUS
  Filled 2017-03-14: qty 5

## 2017-03-14 MED ORDER — SODIUM CHLORIDE 0.9 % IV SOLN
200.0000 mg | Freq: Once | INTRAVENOUS | Status: AC
  Administered 2017-03-14: 200 mg via INTRAVENOUS
  Filled 2017-03-14: qty 8

## 2017-03-14 MED ORDER — PALONOSETRON HCL INJECTION 0.25 MG/5ML
INTRAVENOUS | Status: AC
  Filled 2017-03-14: qty 5

## 2017-03-14 MED ORDER — LORAZEPAM 0.5 MG PO TABS: 0.5000 mg | ORAL_TABLET | Freq: Four times a day (QID) | ORAL | 0 refills | Status: DC | PRN

## 2017-03-14 MED ORDER — CYANOCOBALAMIN 1000 MCG/ML IJ SOLN
INTRAMUSCULAR | Status: AC
  Filled 2017-03-14: qty 1

## 2017-03-14 MED ORDER — FOLIC ACID 1 MG PO TABS: ORAL_TABLET | ORAL | 3 refills | Status: DC

## 2017-03-14 MED ORDER — PALONOSETRON HCL INJECTION 0.25 MG/5ML
0.2500 mg | Freq: Once | INTRAVENOUS | Status: AC
  Administered 2017-03-14: 0.25 mg via INTRAVENOUS

## 2017-03-14 MED ORDER — SODIUM CHLORIDE 0.9 % IV SOLN
260.0000 mg | Freq: Once | INTRAVENOUS | Status: AC
  Administered 2017-03-14: 260 mg via INTRAVENOUS
  Filled 2017-03-14: qty 26

## 2017-03-14 MED ORDER — CYANOCOBALAMIN 1000 MCG/ML IJ SOLN
1000.0000 ug | Freq: Once | INTRAMUSCULAR | Status: AC
  Administered 2017-03-14: 1000 ug via INTRAMUSCULAR

## 2017-03-14 MED ORDER — SODIUM CHLORIDE 0.9 % IV SOLN
410.0000 mg/m2 | Freq: Once | INTRAVENOUS | Status: AC
  Administered 2017-03-14: 700 mg via INTRAVENOUS
  Filled 2017-03-14: qty 8

## 2017-03-14 MED ORDER — SODIUM CHLORIDE 0.9 % IV SOLN
Freq: Once | INTRAVENOUS | Status: AC
  Administered 2017-03-14: 12:00:00 via INTRAVENOUS

## 2017-03-14 MED ORDER — FOLIC ACID 1 MG PO TABS: 1.0000 mg | ORAL_TABLET | Freq: Every day | ORAL | 3 refills | Status: DC

## 2017-03-14 MED ORDER — ONDANSETRON HCL 8 MG PO TABS: 8.0000 mg | ORAL_TABLET | Freq: Two times a day (BID) | ORAL | 1 refills | Status: DC

## 2017-03-14 MED ORDER — ONDANSETRON HCL 8 MG PO TABS: ORAL_TABLET | ORAL | 1 refills | Status: DC

## 2017-03-14 MED ORDER — PROCHLORPERAZINE MALEATE 10 MG PO TABS: 10.0000 mg | ORAL_TABLET | Freq: Four times a day (QID) | ORAL | 1 refills | Status: DC | PRN

## 2017-03-14 NOTE — Patient Instructions (Signed)
Carboplatin injection What is this medicine? CARBOPLATIN (KAR boe pla tin) is a chemotherapy drug. It targets fast dividing cells, like cancer cells, and causes these cells to die. This medicine is used to treat ovarian cancer and many other cancers. This medicine may be used for other purposes; ask your health care provider or pharmacist if you have questions. COMMON BRAND NAME(S): Paraplatin What should I tell my health care provider before I take this medicine? They need to know if you have any of these conditions: -blood disorders -hearing problems -kidney disease -recent or ongoing radiation therapy -an unusual or allergic reaction to carboplatin, cisplatin, other chemotherapy, other medicines, foods, dyes, or preservatives -pregnant or trying to get pregnant -breast-feeding How should I use this medicine? This drug is usually given as an infusion into a vein. It is administered in a hospital or clinic by a specially trained health care professional. Talk to your pediatrician regarding the use of this medicine in children. Special care may be needed. Overdosage: If you think you have taken too much of this medicine contact a poison control center or emergency room at once. NOTE: This medicine is only for you. Do not share this medicine with others. What if I miss a dose? It is important not to miss a dose. Call your doctor or health care professional if you are unable to keep an appointment. What may interact with this medicine? -medicines for seizures -medicines to increase blood counts like filgrastim, pegfilgrastim, sargramostim -some antibiotics like amikacin, gentamicin, neomycin, streptomycin, tobramycin -vaccines Talk to your doctor or health care professional before taking any of these medicines: -acetaminophen -aspirin -ibuprofen -ketoprofen -naproxen This list may not describe all possible interactions. Give your health care provider a list of all the medicines, herbs,  non-prescription drugs, or dietary supplements you use. Also tell them if you smoke, drink alcohol, or use illegal drugs. Some items may interact with your medicine. What should I watch for while using this medicine? Your condition will be monitored carefully while you are receiving this medicine. You will need important blood work done while you are taking this medicine. This drug may make you feel generally unwell. This is not uncommon, as chemotherapy can affect healthy cells as well as cancer cells. Report any side effects. Continue your course of treatment even though you feel ill unless your doctor tells you to stop. In some cases, you may be given additional medicines to help with side effects. Follow all directions for their use. Call your doctor or health care professional for advice if you get a fever, chills or sore throat, or other symptoms of a cold or flu. Do not treat yourself. This drug decreases your body's ability to fight infections. Try to avoid being around people who are sick. This medicine may increase your risk to bruise or bleed. Call your doctor or health care professional if you notice any unusual bleeding. Be careful brushing and flossing your teeth or using a toothpick because you may get an infection or bleed more easily. If you have any dental work done, tell your dentist you are receiving this medicine. Avoid taking products that contain aspirin, acetaminophen, ibuprofen, naproxen, or ketoprofen unless instructed by your doctor. These medicines may hide a fever. Do not become pregnant while taking this medicine. Women should inform their doctor if they wish to become pregnant or think they might be pregnant. There is a potential for serious side effects to an unborn child. Talk to your health care professional or  pharmacist for more information. Do not breast-feed an infant while taking this medicine. What side effects may I notice from receiving this medicine? Side effects  that you should report to your doctor or health care professional as soon as possible: -allergic reactions like skin rash, itching or hives, swelling of the face, lips, or tongue -signs of infection - fever or chills, cough, sore throat, pain or difficulty passing urine -signs of decreased platelets or bleeding - bruising, pinpoint red spots on the skin, black, tarry stools, nosebleeds -signs of decreased red blood cells - unusually weak or tired, fainting spells, lightheadedness -breathing problems -changes in hearing -changes in vision -chest pain -high blood pressure -low blood counts - This drug may decrease the number of white blood cells, red blood cells and platelets. You may be at increased risk for infections and bleeding. -nausea and vomiting -pain, swelling, redness or irritation at the injection site -pain, tingling, numbness in the hands or feet -problems with balance, talking, walking -trouble passing urine or change in the amount of urine Side effects that usually do not require medical attention (report to your doctor or health care professional if they continue or are bothersome): -hair loss -loss of appetite -metallic taste in the mouth or changes in taste This list may not describe all possible side effects. Call your doctor for medical advice about side effects. You may report side effects to FDA at 1-800-FDA-1088. Where should I keep my medicine? This drug is given in a hospital or clinic and will not be stored at home. NOTE: This sheet is a summary. It may not cover all possible information. If you have questions about this medicine, talk to your doctor, pharmacist, or health care provider.  2018 Elsevier/Gold Standard (2007-05-01 14:38:05) Pemetrexed injection What is this medicine? PEMETREXED (PEM e TREX ed) is a chemotherapy drug used to treat lung cancers like non-small cell lung cancer and mesothelioma. It may also be used to treat other cancers. This medicine  may be used for other purposes; ask your health care provider or pharmacist if you have questions. COMMON BRAND NAME(S): Alimta What should I tell my health care provider before I take this medicine? They need to know if you have any of these conditions: -infection (especially a virus infection such as chickenpox, cold sores, or herpes) -kidney disease -low blood counts, like low white cell, platelet, or red cell counts -lung or breathing disease, like asthma -radiation therapy -an unusual or allergic reaction to pemetrexed, other medicines, foods, dyes, or preservative -pregnant or trying to get pregnant -breast-feeding How should I use this medicine? This drug is given as an infusion into a vein. It is administered in a hospital or clinic by a specially trained health care professional. Talk to your pediatrician regarding the use of this medicine in children. Special care may be needed. Overdosage: If you think you have taken too much of this medicine contact a poison control center or emergency room at once. NOTE: This medicine is only for you. Do not share this medicine with others. What if I miss a dose? It is important not to miss your dose. Call your doctor or health care professional if you are unable to keep an appointment. What may interact with this medicine? This medicine may interact with the following medications: -Ibuprofen This list may not describe all possible interactions. Give your health care provider a list of all the medicines, herbs, non-prescription drugs, or dietary supplements you use. Also tell them if you smoke,  drink alcohol, or use illegal drugs. Some items may interact with your medicine. What should I watch for while using this medicine? Visit your doctor for checks on your progress. This drug may make you feel generally unwell. This is not uncommon, as chemotherapy can affect healthy cells as well as cancer cells. Report any side effects. Continue your course  of treatment even though you feel ill unless your doctor tells you to stop. In some cases, you may be given additional medicines to help with side effects. Follow all directions for their use. Call your doctor or health care professional for advice if you get a fever, chills or sore throat, or other symptoms of a cold or flu. Do not treat yourself. This drug decreases your body's ability to fight infections. Try to avoid being around people who are sick. This medicine may increase your risk to bruise or bleed. Call your doctor or health care professional if you notice any unusual bleeding. Be careful brushing and flossing your teeth or using a toothpick because you may get an infection or bleed more easily. If you have any dental work done, tell your dentist you are receiving this medicine. Avoid taking products that contain aspirin, acetaminophen, ibuprofen, naproxen, or ketoprofen unless instructed by your doctor. These medicines may hide a fever. Call your doctor or health care professional if you get diarrhea or mouth sores. Do not treat yourself. To protect your kidneys, drink water or other fluids as directed while you are taking this medicine. Do not become pregnant while taking this medicine or for 6 months after stopping it. Women should inform their doctor if they wish to become pregnant or think they might be pregnant. Men should not father a child while taking this medicine and for 3 months after stopping it. This may interfere with the ability to father a child. You should talk to your doctor or health care professional if you are concerned about your fertility. There is a potential for serious side effects to an unborn child. Talk to your health care professional or pharmacist for more information. Do not breast-feed an infant while taking this medicine or for 1 week after stopping it. What side effects may I notice from receiving this medicine? Side effects that you should report to your  doctor or health care professional as soon as possible: -allergic reactions like skin rash, itching or hives, swelling of the face, lips, or tongue -breathing problems -redness, blistering, peeling or loosening of the skin, including inside the mouth -signs and symptoms of bleeding such as bloody or black, tarry stools; red or dark-brown urine; spitting up blood or brown material that looks like coffee grounds; red spots on the skin; unusual bruising or bleeding from the eye, gums, or nose -signs and symptoms of infection like fever or chills; cough; sore throat; pain or trouble passing urine -signs and symptoms of kidney injury like trouble passing urine or change in the amount of urine -signs and symptoms of liver injury like dark yellow or brown urine; general ill feeling or flu-like symptoms; light-colored stools; loss of appetite; nausea; right upper belly pain; unusually weak or tired; yellowing of the eyes or skin Side effects that usually do not require medical attention (report to your doctor or health care professional if they continue or are bothersome): -constipation -dizziness -mouth sores -nausea, vomiting -pain, tingling, numbness in the hands or feet -unusually weak or tired This list may not describe all possible side effects. Call your doctor for medical  advice about side effects. You may report side effects to FDA at 1-800-FDA-1088. Where should I keep my medicine? This drug is given in a hospital or clinic and will not be stored at home. NOTE: This sheet is a summary. It may not cover all possible information. If you have questions about this medicine, talk to your doctor, pharmacist, or health care provider.  2018 Elsevier/Gold Standard (2015-11-24 18:51:46) Pembrolizumab injection What is this medicine? PEMBROLIZUMAB (pem broe liz ue mab) is a monoclonal antibody. It is used to treat melanoma, head and neck cancer, Hodgkin lymphoma, non-small cell lung cancer, urothelial  cancer, stomach cancer, and cancers that have a certain genetic condition. This medicine may be used for other purposes; ask your health care provider or pharmacist if you have questions. COMMON BRAND NAME(S): Keytruda What should I tell my health care provider before I take this medicine? They need to know if you have any of these conditions: -diabetes -immune system problems -inflammatory bowel disease -liver disease -lung or breathing disease -lupus -organ transplant -an unusual or allergic reaction to pembrolizumab, other medicines, foods, dyes, or preservatives -pregnant or trying to get pregnant -breast-feeding How should I use this medicine? This medicine is for infusion into a vein. It is given by a health care professional in a hospital or clinic setting. A special MedGuide will be given to you before each treatment. Be sure to read this information carefully each time. Talk to your pediatrician regarding the use of this medicine in children. While this drug may be prescribed for selected conditions, precautions do apply. Overdosage: If you think you have taken too much of this medicine contact a poison control center or emergency room at once. NOTE: This medicine is only for you. Do not share this medicine with others. What if I miss a dose? It is important not to miss your dose. Call your doctor or health care professional if you are unable to keep an appointment. What may interact with this medicine? Interactions have not been studied. Give your health care provider a list of all the medicines, herbs, non-prescription drugs, or dietary supplements you use. Also tell them if you smoke, drink alcohol, or use illegal drugs. Some items may interact with your medicine. This list may not describe all possible interactions. Give your health care provider a list of all the medicines, herbs, non-prescription drugs, or dietary supplements you use. Also tell them if you smoke, drink  alcohol, or use illegal drugs. Some items may interact with your medicine. What should I watch for while using this medicine? Your condition will be monitored carefully while you are receiving this medicine. You may need blood work done while you are taking this medicine. Do not become pregnant while taking this medicine or for 4 months after stopping it. Women should inform their doctor if they wish to become pregnant or think they might be pregnant. There is a potential for serious side effects to an unborn child. Talk to your health care professional or pharmacist for more information. Do not breast-feed an infant while taking this medicine or for 4 months after the last dose. What side effects may I notice from receiving this medicine? Side effects that you should report to your doctor or health care professional as soon as possible: -allergic reactions like skin rash, itching or hives, swelling of the face, lips, or tongue -bloody or black, tarry -breathing problems -changes in vision -chest pain -chills -constipation -cough -dizziness or feeling faint or lightheaded -fast or  irregular heartbeat -fever -flushing -hair loss -low blood counts - this medicine may decrease the number of white blood cells, red blood cells and platelets. You may be at increased risk for infections and bleeding. -muscle pain -muscle weakness -persistent headache -signs and symptoms of high blood sugar such as dizziness; dry mouth; dry skin; fruity breath; nausea; stomach pain; increased hunger or thirst; increased urination -signs and symptoms of kidney injury like trouble passing urine or change in the amount of urine -signs and symptoms of liver injury like dark urine, light-colored stools, loss of appetite, nausea, right upper belly pain, yellowing of the eyes or skin -stomach pain -sweating -weight loss Side effects that usually do not require medical attention (report to your doctor or health care  professional if they continue or are bothersome): -decreased appetite -diarrhea -tiredness This list may not describe all possible side effects. Call your doctor for medical advice about side effects. You may report side effects to FDA at 1-800-FDA-1088. Where should I keep my medicine? This drug is given in a hospital or clinic and will not be stored at home. NOTE: This sheet is a summary. It may not cover all possible information. If you have questions about this medicine, talk to your doctor, pharmacist, or health care provider.  2018 Elsevier/Gold Standard (2015-11-03 12:29:36)

## 2017-03-15 ENCOUNTER — Telehealth: Payer: Self-pay

## 2017-03-15 NOTE — Telephone Encounter (Signed)
Spoke with pt's wife re: first time keytruda/alimta/carbo yesterday. Jacqlyn Larsen states that pt had "a good night and is having a good day." Reports pt was feeling well enough to go into work today, but he's "just piddling and checking emails." Denies nausea/fatigue/bowel changes/alterations to appetite. Aware of how to contact on call MD and reviews how to take anti-emetics with pt's spouse. dph

## 2017-03-20 ENCOUNTER — Telehealth: Payer: Self-pay | Admitting: *Deleted

## 2017-03-20 NOTE — Telephone Encounter (Signed)
Patient's wife, [name], notifying the office that patient has been very weak since getting his chemo last week. Over the weekend he became fatigued and weak. He doesn't want to get out of bed, and showering required much encouragement. She wants to know if there is anything that can be done.  Patient is 6 days post Carbo/Alimta/Keytruda and 3 days post the cessation of daily steroids. His symptoms are likely related to treatment, the discontinuation of steroids and his entering nadir. Explained to the wife the importance of good hydration and nutrition. The patient should attempt to get out of bed and move through out the house every 2 hours for 10 minutes.   Wife understands instructions. She will encourage intake and call the office back if patient presents with any further problems.

## 2017-03-22 ENCOUNTER — Other Ambulatory Visit: Payer: Self-pay | Admitting: *Deleted

## 2017-03-22 MED ORDER — OXANDROLONE 10 MG PO TABS: 10.0000 mg | ORAL_TABLET | Freq: Every day | ORAL | 3 refills | Status: AC

## 2017-03-22 MED FILL — OXANDROLONE 10 MG TABLET: 10 | 30 days supply | Qty: 30 | Fill #0

## 2017-03-24 ENCOUNTER — Emergency Department (HOSPITAL_COMMUNITY): Payer: Medicare Other

## 2017-03-24 ENCOUNTER — Other Ambulatory Visit: Payer: Self-pay

## 2017-03-24 ENCOUNTER — Inpatient Hospital Stay (HOSPITAL_COMMUNITY)
Admission: EM | Admit: 2017-03-24 | Discharge: 2017-04-02 | DRG: 871 | Disposition: A | Discharge status: Skilled Nursing Facility | Payer: Medicare Other | Attending: Internal Medicine | Admitting: Internal Medicine

## 2017-03-24 ENCOUNTER — Other Ambulatory Visit (HOSPITAL_COMMUNITY): Payer: Self-pay

## 2017-03-24 ENCOUNTER — Telehealth: Payer: Self-pay | Admitting: *Deleted

## 2017-03-24 ENCOUNTER — Encounter (HOSPITAL_COMMUNITY): Payer: Self-pay

## 2017-03-24 DIAGNOSIS — J9601 Acute respiratory failure with hypoxia: Secondary | ICD-10-CM | POA: Diagnosis present

## 2017-03-24 DIAGNOSIS — Z7901 Long term (current) use of anticoagulants: Secondary | ICD-10-CM

## 2017-03-24 DIAGNOSIS — C3490 Malignant neoplasm of unspecified part of unspecified bronchus or lung: Secondary | ICD-10-CM | POA: Diagnosis not present

## 2017-03-24 DIAGNOSIS — J189 Pneumonia, unspecified organism: Secondary | ICD-10-CM

## 2017-03-24 DIAGNOSIS — E876 Hypokalemia: Secondary | ICD-10-CM | POA: Diagnosis present

## 2017-03-24 DIAGNOSIS — Z87891 Personal history of nicotine dependence: Secondary | ICD-10-CM | POA: Diagnosis not present

## 2017-03-24 DIAGNOSIS — I4891 Unspecified atrial fibrillation: Secondary | ICD-10-CM | POA: Diagnosis present

## 2017-03-24 DIAGNOSIS — Z8509 Personal history of malignant neoplasm of other digestive organs: Secondary | ICD-10-CM | POA: Diagnosis not present

## 2017-03-24 DIAGNOSIS — S0990XA Unspecified injury of head, initial encounter: Secondary | ICD-10-CM | POA: Diagnosis not present

## 2017-03-24 DIAGNOSIS — I10 Essential (primary) hypertension: Secondary | ICD-10-CM | POA: Diagnosis present

## 2017-03-24 DIAGNOSIS — E160 Drug-induced hypoglycemia without coma: Secondary | ICD-10-CM | POA: Diagnosis not present

## 2017-03-24 DIAGNOSIS — R627 Adult failure to thrive: Secondary | ICD-10-CM | POA: Diagnosis present

## 2017-03-24 DIAGNOSIS — Z79890 Hormone replacement therapy (postmenopausal): Secondary | ICD-10-CM | POA: Diagnosis not present

## 2017-03-24 DIAGNOSIS — E860 Dehydration: Secondary | ICD-10-CM | POA: Diagnosis present

## 2017-03-24 DIAGNOSIS — Z681 Body mass index (BMI) 19 or less, adult: Secondary | ICD-10-CM | POA: Diagnosis not present

## 2017-03-24 DIAGNOSIS — Z66 Do not resuscitate: Secondary | ICD-10-CM | POA: Diagnosis present

## 2017-03-24 DIAGNOSIS — J188 Other pneumonia, unspecified organism: Secondary | ICD-10-CM | POA: Diagnosis not present

## 2017-03-24 DIAGNOSIS — Z515 Encounter for palliative care: Secondary | ICD-10-CM | POA: Diagnosis not present

## 2017-03-24 DIAGNOSIS — R739 Hyperglycemia, unspecified: Secondary | ICD-10-CM

## 2017-03-24 DIAGNOSIS — D6959 Other secondary thrombocytopenia: Secondary | ICD-10-CM | POA: Diagnosis present

## 2017-03-24 DIAGNOSIS — A021 Salmonella sepsis: Secondary | ICD-10-CM | POA: Diagnosis not present

## 2017-03-24 DIAGNOSIS — Z794 Long term (current) use of insulin: Secondary | ICD-10-CM | POA: Diagnosis not present

## 2017-03-24 DIAGNOSIS — E1165 Type 2 diabetes mellitus with hyperglycemia: Secondary | ICD-10-CM | POA: Diagnosis present

## 2017-03-24 DIAGNOSIS — T451X5A Adverse effect of antineoplastic and immunosuppressive drugs, initial encounter: Secondary | ICD-10-CM | POA: Diagnosis present

## 2017-03-24 DIAGNOSIS — T383X5A Adverse effect of insulin and oral hypoglycemic [antidiabetic] drugs, initial encounter: Secondary | ICD-10-CM | POA: Diagnosis not present

## 2017-03-24 DIAGNOSIS — R531 Weakness: Secondary | ICD-10-CM

## 2017-03-24 DIAGNOSIS — I4819 Other persistent atrial fibrillation: Secondary | ICD-10-CM

## 2017-03-24 DIAGNOSIS — R64 Cachexia: Secondary | ICD-10-CM | POA: Diagnosis present

## 2017-03-24 DIAGNOSIS — E039 Hypothyroidism, unspecified: Secondary | ICD-10-CM | POA: Diagnosis present

## 2017-03-24 DIAGNOSIS — Z79899 Other long term (current) drug therapy: Secondary | ICD-10-CM | POA: Diagnosis not present

## 2017-03-24 DIAGNOSIS — Z888 Allergy status to other drugs, medicaments and biological substances status: Secondary | ICD-10-CM | POA: Diagnosis not present

## 2017-03-24 DIAGNOSIS — C3430 Malignant neoplasm of lower lobe, unspecified bronchus or lung: Secondary | ICD-10-CM | POA: Diagnosis not present

## 2017-03-24 DIAGNOSIS — A419 Sepsis, unspecified organism: Secondary | ICD-10-CM | POA: Diagnosis present

## 2017-03-24 DIAGNOSIS — E11649 Type 2 diabetes mellitus with hypoglycemia without coma: Secondary | ICD-10-CM | POA: Diagnosis not present

## 2017-03-24 DIAGNOSIS — Z7952 Long term (current) use of systemic steroids: Secondary | ICD-10-CM | POA: Diagnosis not present

## 2017-03-24 DIAGNOSIS — E43 Unspecified severe protein-calorie malnutrition: Secondary | ICD-10-CM | POA: Diagnosis present

## 2017-03-24 DIAGNOSIS — C7951 Secondary malignant neoplasm of bone: Secondary | ICD-10-CM | POA: Diagnosis present

## 2017-03-24 DIAGNOSIS — C782 Secondary malignant neoplasm of pleura: Secondary | ICD-10-CM | POA: Diagnosis present

## 2017-03-24 DIAGNOSIS — W0110XA Fall on same level from slipping, tripping and stumbling with subsequent striking against unspecified object, initial encounter: Secondary | ICD-10-CM | POA: Diagnosis present

## 2017-03-24 DIAGNOSIS — I2699 Other pulmonary embolism without acute cor pulmonale: Secondary | ICD-10-CM | POA: Diagnosis present

## 2017-03-24 DIAGNOSIS — D72819 Decreased white blood cell count, unspecified: Secondary | ICD-10-CM | POA: Diagnosis not present

## 2017-03-24 DIAGNOSIS — D701 Agranulocytosis secondary to cancer chemotherapy: Secondary | ICD-10-CM | POA: Diagnosis present

## 2017-03-24 DIAGNOSIS — C3492 Malignant neoplasm of unspecified part of left bronchus or lung: Secondary | ICD-10-CM | POA: Diagnosis present

## 2017-03-24 DIAGNOSIS — W19XXXA Unspecified fall, initial encounter: Secondary | ICD-10-CM

## 2017-03-24 DIAGNOSIS — Z7189 Other specified counseling: Secondary | ICD-10-CM | POA: Diagnosis not present

## 2017-03-24 DIAGNOSIS — E162 Hypoglycemia, unspecified: Secondary | ICD-10-CM | POA: Diagnosis not present

## 2017-03-24 DIAGNOSIS — D696 Thrombocytopenia, unspecified: Secondary | ICD-10-CM | POA: Diagnosis not present

## 2017-03-24 LAB — URINALYSIS, ROUTINE W REFLEX MICROSCOPIC
BACTERIA UA: NONE SEEN
Bilirubin Urine: NEGATIVE
Ketones, ur: 20 mg/dL — AB
LEUKOCYTES UA: NEGATIVE
NITRITE: NEGATIVE
PH: 5 (ref 5.0–8.0)
Protein, ur: 30 mg/dL — AB
SPECIFIC GRAVITY, URINE: 1.017 (ref 1.005–1.030)

## 2017-03-24 LAB — HEPATIC FUNCTION PANEL
ALBUMIN: 3.5 g/dL (ref 3.5–5.0)
ALK PHOS: 95 U/L (ref 38–126)
ALT: 25 U/L (ref 17–63)
AST: 16 U/L (ref 15–41)
Bilirubin, Direct: 0.2 mg/dL (ref 0.1–0.5)
Indirect Bilirubin: 0.7 mg/dL (ref 0.3–0.9)
TOTAL PROTEIN: 6.4 g/dL — AB (ref 6.5–8.1)
Total Bilirubin: 0.9 mg/dL (ref 0.3–1.2)

## 2017-03-24 LAB — GLUCOSE, CAPILLARY: Glucose-Capillary: 250 mg/dL — ABNORMAL HIGH (ref 65–99)

## 2017-03-24 LAB — CBC
HEMATOCRIT: 40 % (ref 39.0–52.0)
Hemoglobin: 13.9 g/dL (ref 13.0–17.0)
MCH: 31.7 pg (ref 26.0–34.0)
MCHC: 34.8 g/dL (ref 30.0–36.0)
MCV: 91.3 fL (ref 78.0–100.0)
PLATELETS: 28 10*3/uL — AB (ref 150–400)
RBC: 4.38 MIL/uL (ref 4.22–5.81)
RDW: 13.6 % (ref 11.5–15.5)
WBC: 1.5 10*3/uL — AB (ref 4.0–10.5)

## 2017-03-24 LAB — DIFFERENTIAL
BASOS ABS: 0 10*3/uL (ref 0.0–0.1)
Basophils Relative: 0 %
Eosinophils Absolute: 0 10*3/uL (ref 0.0–0.7)
Eosinophils Relative: 1 %
LYMPHS ABS: 0.4 10*3/uL — AB (ref 0.7–4.0)
LYMPHS PCT: 26 %
Monocytes Absolute: 0.2 10*3/uL (ref 0.1–1.0)
Monocytes Relative: 14 %
NEUTROS ABS: 1 10*3/uL — AB (ref 1.7–7.7)
NEUTROS PCT: 59 %

## 2017-03-24 LAB — LIPASE, BLOOD: Lipase: 16 U/L (ref 11–51)

## 2017-03-24 LAB — I-STAT TROPONIN, ED: Troponin i, poc: 0.02 ng/mL (ref 0.00–0.08)

## 2017-03-24 LAB — BASIC METABOLIC PANEL
Anion gap: 13 (ref 5–15)
BUN: 23 mg/dL — ABNORMAL HIGH (ref 6–20)
CALCIUM: 9.1 mg/dL (ref 8.9–10.3)
CO2: 27 mmol/L (ref 22–32)
Chloride: 98 mmol/L — ABNORMAL LOW (ref 101–111)
Creatinine, Ser: 0.98 mg/dL (ref 0.61–1.24)
GFR calc Af Amer: >60 mL/min (ref >60)
Glucose, Bld: 352 mg/dL — ABNORMAL HIGH (ref 65–99)
POTASSIUM: 3.8 mmol/L (ref 3.5–5.1)
SODIUM: 138 mmol/L (ref 135–145)

## 2017-03-24 LAB — I-STAT CG4 LACTIC ACID, ED: LACTIC ACID, VENOUS: 1.02 mmol/L (ref 0.5–1.9)

## 2017-03-24 MED ORDER — VANCOMYCIN HCL IN DEXTROSE 750-5 MG/150ML-% IV SOLN
750.0000 mg | INTRAVENOUS | Status: DC
  Administered 2017-03-25 – 2017-03-27 (×3): 750 mg via INTRAVENOUS
  Filled 2017-03-24 (×3): qty 150

## 2017-03-24 MED ORDER — SODIUM CHLORIDE 0.9 % IV SOLN
INTRAVENOUS | Status: AC
  Administered 2017-03-24 – 2017-03-25 (×2): via INTRAVENOUS

## 2017-03-24 MED ORDER — ONDANSETRON HCL 4 MG/2ML IJ SOLN
4.0000 mg | Freq: Three times a day (TID) | INTRAMUSCULAR | Status: DC | PRN
  Administered 2017-03-24 – 2017-03-31 (×4): 4 mg via INTRAVENOUS
  Filled 2017-03-24 (×5): qty 2

## 2017-03-24 MED ORDER — DILTIAZEM LOAD VIA INFUSION: 10.0000 mg | Freq: Once | INTRAVENOUS | Status: DC

## 2017-03-24 MED ORDER — SODIUM CHLORIDE 0.9 % IV SOLN
1.0000 g | Freq: Two times a day (BID) | INTRAVENOUS | Status: DC
  Administered 2017-03-25 – 2017-03-28 (×7): 1 g via INTRAVENOUS
  Filled 2017-03-24 (×9): qty 1

## 2017-03-24 MED ORDER — INSULIN ASPART 100 UNIT/ML ~~LOC~~ SOLN
0.0000 [IU] | Freq: Three times a day (TID) | SUBCUTANEOUS | Status: DC
  Administered 2017-03-25: 1 [IU] via SUBCUTANEOUS
  Administered 2017-03-25 (×2): 3 [IU] via SUBCUTANEOUS
  Administered 2017-03-26: 2 [IU] via SUBCUTANEOUS
  Administered 2017-03-27: 1 [IU] via SUBCUTANEOUS
  Administered 2017-03-27: 3 [IU] via SUBCUTANEOUS
  Administered 2017-03-27 – 2017-03-28 (×2): 1 [IU] via SUBCUTANEOUS
  Administered 2017-03-28: 7 [IU] via SUBCUTANEOUS
  Administered 2017-03-28: 1 [IU] via SUBCUTANEOUS
  Administered 2017-03-29: 2 [IU] via SUBCUTANEOUS
  Administered 2017-03-29: 1 [IU] via SUBCUTANEOUS
  Administered 2017-03-29: 7 [IU] via SUBCUTANEOUS
  Administered 2017-03-30: 2 [IU] via SUBCUTANEOUS

## 2017-03-24 MED ORDER — SODIUM CHLORIDE 0.9 % IV SOLN
Freq: Once | INTRAVENOUS | Status: AC
  Administered 2017-03-24: 18:00:00 via INTRAVENOUS

## 2017-03-24 MED ORDER — MORPHINE SULFATE (PF) 4 MG/ML IV SOLN
4.0000 mg | Freq: Once | INTRAVENOUS | Status: AC
  Administered 2017-03-24: 4 mg via INTRAVENOUS
  Filled 2017-03-24: qty 1

## 2017-03-24 MED ORDER — MORPHINE SULFATE (PF) 4 MG/ML IV SOLN
2.0000 mg | INTRAVENOUS | Status: DC | PRN
  Administered 2017-03-24 – 2017-03-27 (×3): 2 mg via INTRAVENOUS
  Filled 2017-03-24 (×4): qty 1

## 2017-03-24 MED ORDER — LORAZEPAM 0.5 MG PO TABS: 0.5000 mg | ORAL_TABLET | Freq: Four times a day (QID) | ORAL | Status: DC | PRN

## 2017-03-24 MED ORDER — MORPHINE SULFATE (PF) 2 MG/ML IV SOLN
2.0000 mg | INTRAVENOUS | Status: DC | PRN
  Administered 2017-03-24: 2 mg via INTRAVENOUS
  Filled 2017-03-24: qty 1

## 2017-03-24 MED ORDER — INSULIN GLARGINE 100 UNIT/ML ~~LOC~~ SOLN
10.0000 [IU] | Freq: Every day | SUBCUTANEOUS | Status: DC
  Administered 2017-03-24 – 2017-03-30 (×5): 10 [IU] via SUBCUTANEOUS
  Filled 2017-03-24 (×7): qty 0.1

## 2017-03-24 MED ORDER — SODIUM CHLORIDE 0.9 % IV BOLUS (SEPSIS)
1000.0000 mL | Freq: Once | INTRAVENOUS | Status: AC
  Administered 2017-03-24: 1000 mL via INTRAVENOUS

## 2017-03-24 MED ORDER — METOPROLOL TARTRATE 5 MG/5ML IV SOLN
5.0000 mg | Freq: Once | INTRAVENOUS | Status: AC | PRN
  Administered 2017-03-25: 5 mg via INTRAVENOUS
  Filled 2017-03-24: qty 5

## 2017-03-24 MED ORDER — DILTIAZEM HCL ER 240 MG PO CP24
240.0000 mg | ORAL_CAPSULE | Freq: Every day | ORAL | Status: DC
  Administered 2017-03-24: 240 mg via ORAL
  Filled 2017-03-24 (×2): qty 1

## 2017-03-24 MED ORDER — IOPAMIDOL (ISOVUE-300) INJECTION 61%
INTRAVENOUS | Status: AC
  Filled 2017-03-24: qty 30

## 2017-03-24 MED ORDER — IPRATROPIUM-ALBUTEROL 0.5-2.5 (3) MG/3ML IN SOLN: 3.0000 mL | Freq: Four times a day (QID) | RESPIRATORY_TRACT | Status: DC | PRN

## 2017-03-24 MED ORDER — VANCOMYCIN HCL IN DEXTROSE 1-5 GM/200ML-% IV SOLN
1000.0000 mg | Freq: Once | INTRAVENOUS | Status: DC
  Filled 2017-03-24 (×2): qty 200

## 2017-03-24 MED ORDER — LEVOTHYROXINE SODIUM 100 MCG PO TABS
100.0000 ug | ORAL_TABLET | Freq: Every day | ORAL | Status: DC
  Administered 2017-03-25 – 2017-04-02 (×9): 100 ug via ORAL
  Filled 2017-03-24 (×9): qty 1

## 2017-03-24 MED ORDER — ONDANSETRON HCL 4 MG/2ML IJ SOLN
4.0000 mg | Freq: Once | INTRAMUSCULAR | Status: AC
Start: 2017-03-24 — End: 2017-03-24
  Administered 2017-03-24: 4 mg via INTRAVENOUS
  Filled 2017-03-24: qty 2

## 2017-03-24 MED ORDER — DILTIAZEM HCL 25 MG/5ML IV SOLN
10.0000 mg | Freq: Once | INTRAVENOUS | Status: AC
Start: 2017-03-24 — End: 2017-03-24
  Administered 2017-03-24: 10 mg via INTRAVENOUS
  Filled 2017-03-24: qty 5

## 2017-03-24 NOTE — Progress Notes (Signed)
Pharmacy Antibiotic Note  [name] is a 84 y.o. male admitted on 03/24/2017 with HCAP.  Pharmacy has been consulted for Vancomycin and Cefepime dosing.  Plan:  Vancomycin 1000 mg IV now, then 750 mg IV q24 hr (est AUC 520 based on SCr 1.0; TBW < IBW)  Measure vancomycin AUC at steady state as indicated  Cefepime 1 g IV q12 hr   Height: 5' 7.5" (171.5 cm) Weight: 118 lb (53.5 kg) IBW/kg (Calculated) : 67.25  Temp (24hrs), Avg:97.9 F (36.6 C), Min:97.9 F (36.6 C), Max:97.9 F (36.6 C)  Recent Labs  Lab 03/24/17 1418 03/24/17 1509  WBC 1.5*  --   CREATININE 0.98  --   LATICACIDVEN  --  1.02    Estimated Creatinine Clearance: 44.7 mL/min (by C-G formula based on SCr of 0.98 mg/dL).    Allergies  Allergen Reactions  . Promethazine Other (See Comments)    Makes hyper    Thank you for allowing pharmacy to be a part of this patient's care.  Reuel Boom, PharmD, BCPS [phone] 03/24/2017, 5:46 PM

## 2017-03-24 NOTE — ED Triage Notes (Addendum)
Pt BIB EMS from home- pt stage 4 lung CA pt, c/o increasing weakness and decreased appetite over the past 5 days since last chemo tx 10 days ago.  Pt ambulatory with assistance per EMS. Denies chest pain, shortness of breath. Pt's wife states pt fell getting out of bed yesterday morning, denies injury.  Pt is a diabetic, [facility] was [facility]. Pt does take insulin, last insulin was last night around 6:15pm, 3u fast acting insulin. Denies taking insulin or any other home medications today.

## 2017-03-24 NOTE — ED Notes (Signed)
Pt refuses blood culture x2.

## 2017-03-24 NOTE — H&P (Signed)
[facility] History and Physical  KWAMANE WHACK PXT:[id number] DOB: 07-16-1933 DOA: 03/24/2017  Referring physician:  PCP: Lavone Orn, MD  Specialists:   Chief Complaint: weakness. FTT  HPI: JAQUES MINEER is a 84 y.o. male with PMH of HTN, Hypothyroidism, Stage Iv lung cancer (mets pleural/bone), h/o pancreatic ca, recent chemotherapy 9 days ago presented with worsening abdominal pains, nausea, cough and generalized weakness. Yesterday, he tried to get out of bed but unable to stand then fell on the ground striking his head. He reports progressive weakness, he is unable to do his ADL. He reports cough, dyspnea, pleur tic chest pain. In the ED, he refused further imagine studies. He wanted to be comfortable, DNR. Patient is very cachetic with severe malnutrition. He reports some loose stool but no diarrhea. CXR showed dense consolidation versus atelectasis in the left lower lobe. I have d/w patient and his family at length. He declined further aggressive care, decline imaging studies. He understands that he is is in critical condition. He wants to try antibiotics for pneumonia and cardizem for a fib. agreed with palliative care consultation     Review of Systems: The patient denies anorexia, fever, weight loss,, vision loss, decreased hearing, hoarseness, chest pain, syncope, dyspnea on exertion, peripheral edema, balance deficits, hemoptysis, abdominal pain, melena, hematochezia, severe indigestion/heartburn, hematuria, incontinence, genital sores, muscle weakness, suspicious skin lesions, transient blindness, difficulty walking, depression, unusual weight change, abnormal bleeding, enlarged lymph nodes, angioedema, and breast masses.    Past Medical History:  Diagnosis Date  . Adenocarcinoma of lung, stage 4, right ([place]) 03/03/2017  . Atrial fibrillation ([place])   . 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 Counseling regarding goals of care 03/03/2017  . Diabetes mellitus   .  H/O echocardiogram 2003,2009,2010, 2012  . Hypertension   . Hypothyroidism   . Iron deficiency anemia, unspecified 01/27/2013  . Lung cancer ([place]) dx'd 02/2009   surg only  . Lung cancer, lower lobe ([place]) 01/27/2013  . Mass of left lung   . Metastatic lung carcinoma, left ([place]) 03/03/2017  . pancreatic ca dx'd 2004   oral chemo/xrt comp  . Wears glasses    Past Surgical History:  Procedure Laterality Date  . CHOLECYSTECTOMY    . COLONOSCOPY    . HERNIA REPAIR    . MULTIPLE TOOTH EXTRACTIONS    . TONSILLECTOMY    . VIDEO BRONCHOSCOPY N/A 02/16/2017   Procedure: VIDEO BRONCHOSCOPY;  Surgeon: Melrose Nakayama, MD;  Location: [place] [facility];  Service: Thoracic;  Laterality: N/A;  . VIDEO BRONCHOSCOPY WITH ENDOBRONCHIAL ULTRASOUND N/A 10/20/2014   Procedure: VIDEO BRONCHOSCOPY WITH ENDOBRONCHIAL ULTRASOUND;  Surgeon: Melrose Nakayama, MD;  Location: [place];  Service: Thoracic;  Laterality: N/A;   Social History:  reports that he quit smoking about 59 years ago. His smoking use included cigarettes. He started smoking about 66 years ago. He has a 14.00 pack-year smoking history. he has never used smokeless tobacco. He reports that he drinks alcohol. He reports that he does not use drugs. Home;  where does patient live--home, ALF, SNF? and with whom if at home? No; Can patient participate in ADLs?  Allergies  Allergen Reactions  . Promethazine Other (See Comments)    Makes hyper    Family History  Problem Relation Age of Onset  . Emphysema Father   . Lung cancer Brother     (be sure to complete)  Prior to Admission medications   Medication Sig Start Date End Date  Taking? Authorizing Provider  atorvastatin (LIPITOR) 10 MG tablet Take 10 mg by mouth 2 (two) times a week. Monday & Friday. 09/22/11  Yes [provider]  dexamethasone (DECADRON) 4 MG tablet Take 1 tab two times a day the day before Alimta chemo. Take 2 tabs two times a day starting the day after chemo for 3 days.  03/14/17  Yes [name], MD  diltiazem (CARDIZEM) 60 MG tablet Take 60 mg by mouth 3 (three) times daily as needed (for heart palpitations).    Yes [provider]  diltiazem (DILACOR XR) 240 MG 24 hr capsule Take 240 mg by mouth daily.   Yes [provider]  folic acid (FOLVITE) 1 MG tablet Take 1 tablet daily. Take daily starting 5-7 days before Alimta chemotherapy. Continue until 21 days after Alimta completed. 03/14/17  Yes [name], MD  glimepiride (AMARYL) 2 MG tablet Take 2 tablets (4 mg total) by mouth daily before breakfast. 03/03/17  Yes Ennever, Rudell Cobb, MD  insulin aspart (FIASP) 100 UNIT/ML injection Inject 0-10 Units into the skin 3 (three) times daily before meals. PER SLIDING SCALE BEFORE MEALS   Yes [provider]  Insulin Glargine (BASAGLAR KWIKPEN) 100 UNIT/ML SOPN Inject 10 Units into the skin at bedtime.   Yes [provider]  levothyroxine (SYNTHROID, LEVOTHROID) 100 MCG tablet Take 100 mcg by mouth daily before breakfast.  12/20/13  Yes [provider]  metFORMIN (GLUCOPHAGE-XR) 500 MG 24 hr tablet Take 500-1,000 mg by mouth 2 (two) times daily. Take 2 tablets (1000 mg) in the morning, and 1 tablet (500 mg) in the evening.   Yes [provider]  ondansetron (ZOFRAN) 8 MG tablet Take two times a day starting the day after chemo for 3 days. Then take two times a day as needed for nausea or vomiting. 03/14/17  Yes [name], MD  oxandrolone (OXANDRIN) 10 MG tablet Take 1 tablet (10 mg total) by mouth daily after breakfast. 03/22/17  Yes Ennever, Rudell Cobb, MD  rivaroxaban (XARELTO) 10 MG TABS tablet Take 1 tablet (10 mg total) by mouth daily with supper. 03/03/17  Yes [name], MD  zolpidem (AMBIEN) 5 MG tablet Take 5 mg by mouth at bedtime as needed for sleep.  01/24/13  Yes [provider]  LORazepam (ATIVAN) 0.5 MG tablet Take 1 tablet (0.5 mg total) by mouth every 6 (six) hours as needed (Nausea or  vomiting). Patient not taking: Reported on 03/24/2017 03/14/17   [name], MD  prochlorperazine (COMPAZINE) 10 MG tablet Take 1 tablet (10 mg total) by mouth every 6 (six) hours as needed (Nausea or vomiting). Patient not taking: Reported on 03/24/2017 03/14/17   [name], MD  prochlorperazine (COMPAZINE) 25 MG suppository Place 1 suppository (25 mg total) rectally every 12 (twelve) hours as needed for nausea. Patient not taking: Reported on 03/24/2017 03/14/17   [name], MD   Physical Exam: Vitals:   03/24/17 1600 03/24/17 1630  BP: (!) 149/107 (!) 156/113  Pulse:  (!) 130  Resp: 16 12  Temp:    SpO2:  96%     General:  Alert. Looks sick   Eyes: eom-i  ENT: no ulcers   Neck: supple, no jvd  Cardiovascular: s1,s2 tachycardia   Respiratory: poor ventilation, rales LLL  Abdomen: soft, nt  Skin: dermatitis   Musculoskeletal: no leg edema   Psychiatric: no hallucinations   Neurologic: CN 2-12 intact. Motor  Intact  Labs on Admission:  Basic Metabolic Panel: Recent Labs  Lab 03/24/17 1418  NA 138  K 3.8  CL 98*  CO2 27  GLUCOSE 352*  BUN 23*  CREATININE 0.98  CALCIUM 9.1   Liver Function Tests: Recent Labs  Lab 03/24/17 1418  AST 16  ALT 25  ALKPHOS 95  BILITOT 0.9  PROT 6.4*  ALBUMIN 3.5   Recent Labs  Lab 03/24/17 1418  LIPASE 16   No results for input(s): AMMONIA in the last 168 hours. CBC: Recent Labs  Lab 03/24/17 1418  WBC 1.5*  NEUTROABS 1.0*  HGB 13.9  HCT 40.0  MCV 91.3  PLT 28*   Cardiac Enzymes: No results for input(s): CKTOTAL, CKMB, CKMBINDEX, TROPONINI in the last 168 hours.  BNP (last 3 results) No results for input(s): BNP in the last 8760 hours.  ProBNP (last 3 results) No results for input(s): PROBNP in the last 8760 hours.  CBG: No results for input(s): GLUCAP in the last 168 hours.  Radiological Exams on Admission: Dg Chest Portable 1 View  Result Date: 03/24/2017 CLINICAL DATA:  Shortness  of breath.  Stage IV lung cancer. EXAM: PORTABLE CHEST 1 VIEW COMPARISON:  Chest x-ray dated February 16, 2017. FINDINGS: The heart size and mediastinal contours are within normal limits. Normal pulmonary vascularity. Dense consolidation in the left lower lobe with volume loss in the left hemithorax. Probable small left pleural effusion. The right lung is clear. No pneumothorax. No acute osseous abnormality. IMPRESSION: Dense consolidation versus atelectasis in the left lower lobe. Small left pleural effusion. Electronically Signed   By: Titus Dubin M.D.   On: 03/24/2017 16:32    EKG: Independently reviewed.   Assessment/Plan Active Problems:   Sepsis ([place])   84 y.o. male with PMH of HTN, Hypothyroidism, Stage Iv lung cancer (mets pleural/bone), h/o pancreatic ca, recent chemotherapy 9 days ago presented with worsening abdominal pains, nausea, cough and generalized weakness.  A fib RVR. Will try iv cardizem bolus, if persistent then iv cardizem drip. D/w patient, his family: hold anticoagulation due to thrombocytopenia, risk of bleeding   Lung cancer, possible post obstructive pneumonia. He wants to try iv antibiotics. But declined cultures.   FTT, advanced cancer. Thrombocytopenia, neutropenia post chemo. D/w patient, his family. He wants to be DNR. Declined all imaging studies or interventions. However, he wants to try some antibiotics for pneuamonia, and Cardizem for heart rate control   Prognosis is poor with advanced cancer, related complication, pulmonary embolism, infections. Consulted palliative care for hospice discussions.    Oncology ;  if consultant consulted, please document name and whether formally or informally consulted  Code Status: DNR (must indicate code status--if unknown or must be presumed, indicate so) Family Communication: d/w patient, his family  (indicate person spoken with, if applicable, with phone number if by telephone) Disposition Plan: pend further eval   (indicate anticipated LOS)  Time spent: >45 minutes   Kinnie Feil [facility] Pager [phone]  If 7PM-7AM, please contact night-coverage [email or web address] Password [facility] 03/24/2017, 5:23 PM

## 2017-03-24 NOTE — ED Notes (Signed)
Bed: RJ18 Expected date:  Expected time:  Means of arrival:  Comments: Room 13: 84 yo CA pt, gen weakness

## 2017-03-24 NOTE — ED Notes (Signed)
Date and time results received: 03/24/17 3:28 PM   Test: plt Critical Value: 28,000  Name of Provider Notified: Darl Householder MD, Lahoma Rocker PA  Orders Received? Or Actions Taken?: MD and PA made aware, waiting for orders

## 2017-03-24 NOTE — Telephone Encounter (Signed)
Patient's daughter, [name], reporting to the office that patient's condition continues to decline. He was avoiding some foods due to rising blood sugars, but saw his PCP yesterday and was started on insulin. Today he is refusing to eat or drink because he feels so poorly. He is extremely weak. He states his bones hurt and he's nauseous. He has developed what the daughter describes as a hematoma on his abdomen.   Reviewed all symptoms with Dr Marin Olp. Patient's daughter is advised to bring the patient to the ED for further assessment. Daughter understood. She also mentioned hospice. Explained to the daughter to have him assessed at the hospital and if appropriate, hospice would be ordered for patient. She understood and will bring him to [facility] ED for assessment.

## 2017-03-24 NOTE — ED Provider Notes (Signed)
[facility] DEPT Provider Note   CSN: [id number] Arrival date & time: 03/24/17  1307     History   Chief Complaint Chief Complaint  Patient presents with  . Weakness    HPI [name] is a 84 y.o. male.  HPI  [name] is a 84 y.o. malewith hx of stage 4 lung ca, DM, Afib on xarelto, HTN, hx of pancreatic ca, presents to ED with complaint of pain in his abdomen, nausea, generalized weakness.  Patient had first round of chemotherapy 9 days ago.  Patient with increased weakness over the last several days.  Yesterday tried to get out of the bed, and unable to stand up, fell to the ground striking his forehead.  He believes he may have lost consciousness for "a second."  He has been nonambulatory since yesterday.  He has not taking any of his medications today, and refused taking his Xarelto over the last several days.  Patient is not eating or drinking.  Generalized decline in his health over the last week.  Per EMS, CBG greater than 400, tachycardic, in atrial fibrillation. Pt wishes to be DNR   Past Medical History:  Diagnosis Date  . Adenocarcinoma of lung, stage 4, right ([place]) 03/03/2017  . Atrial fibrillation ([place])   . 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 Counseling regarding goals of care 03/03/2017  . Diabetes mellitus   . H/O echocardiogram 2003,2009,2010, 2012  . Hypertension   . Hypothyroidism   . Iron deficiency anemia, unspecified 01/27/2013  . Lung cancer ([place]) dx'd 02/2009   surg only  . Lung cancer, lower lobe ([place]) 01/27/2013  . Mass of left lung   . Metastatic lung carcinoma, left ([place]) 03/03/2017  . pancreatic ca dx'd 2004   oral chemo/xrt comp  . Wears glasses     Patient Active Problem List   Diagnosis Date Noted  . Metastatic lung carcinoma, left ([place]) 03/03/2017  . Adenocarcinoma of lung, stage 4, right ([place]) 03/03/2017  . Counseling regarding goals of care 03/03/2017  . Unintentional weight loss  12/13/2016  . Wheezing on auscultation 11/02/2015  . Mitral valve insufficiency 11/02/2015  . Murmur 09/24/2015  . Dizziness 09/24/2015  . Lung cancer, lower lobe ([place]) 01/27/2013  . 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 Iron deficiency anemia due to chronic blood loss 01/27/2013  . Atrial fibrillation ([place]) 05/26/2012  . Long term current use of anticoagulant therapy 05/26/2012    Past Surgical History:  Procedure Laterality Date  . CHOLECYSTECTOMY    . COLONOSCOPY    . HERNIA REPAIR    . MULTIPLE TOOTH EXTRACTIONS    . TONSILLECTOMY    . VIDEO BRONCHOSCOPY N/A 02/16/2017   Procedure: VIDEO BRONCHOSCOPY;  Surgeon: Melrose Nakayama, MD;  Location: [place];  Service: Thoracic;  Laterality: N/A;  . VIDEO BRONCHOSCOPY WITH ENDOBRONCHIAL ULTRASOUND N/A 10/20/2014   Procedure: VIDEO BRONCHOSCOPY WITH ENDOBRONCHIAL ULTRASOUND;  Surgeon: Melrose Nakayama, MD;  Location: [place];  Service: Thoracic;  Laterality: N/A;       Home Medications    Prior to Admission medications   Medication Sig Start Date End Date Taking? Authorizing Provider  atorvastatin (LIPITOR) 10 MG tablet Take 10 mg by mouth 2 (two) times a week. Monday & Friday. 09/22/11  Yes [provider]  dexamethasone (DECADRON) 4 MG tablet Take 1 tab two times a day the day before Alimta chemo. Take 2 tabs two times a day starting  the day after chemo for 3 days. 03/14/17  Yes [name], MD  diltiazem (CARDIZEM) 60 MG tablet Take 60 mg by mouth 3 (three) times daily as needed (for heart palpitations).    Yes [provider]  diltiazem (DILACOR XR) 240 MG 24 hr capsule Take 240 mg by mouth daily.   Yes [provider]  folic acid (FOLVITE) 1 MG tablet Take 1 tablet daily. Take daily starting 5-7 days before Alimta chemotherapy. Continue until 21 days after Alimta completed. 03/14/17  Yes [name], MD  glimepiride (AMARYL) 2 MG tablet Take 2 tablets (4 mg total) by mouth daily before  breakfast. 03/03/17  Yes Ennever, Rudell Cobb, MD  insulin aspart (FIASP) 100 UNIT/ML injection Inject 0-10 Units into the skin 3 (three) times daily before meals. PER SLIDING SCALE BEFORE MEALS   Yes [provider]  Insulin Glargine (BASAGLAR KWIKPEN) 100 UNIT/ML SOPN Inject 10 Units into the skin at bedtime.   Yes [provider]  levothyroxine (SYNTHROID, LEVOTHROID) 100 MCG tablet Take 100 mcg by mouth daily before breakfast.  12/20/13  Yes [provider]  metFORMIN (GLUCOPHAGE-XR) 500 MG 24 hr tablet Take 500-1,000 mg by mouth 2 (two) times daily. Take 2 tablets (1000 mg) in the morning, and 1 tablet (500 mg) in the evening.   Yes [provider]  ondansetron (ZOFRAN) 8 MG tablet Take two times a day starting the day after chemo for 3 days. Then take two times a day as needed for nausea or vomiting. 03/14/17  Yes [name], MD  oxandrolone (OXANDRIN) 10 MG tablet Take 1 tablet (10 mg total) by mouth daily after breakfast. 03/22/17  Yes Ennever, Rudell Cobb, MD  rivaroxaban (XARELTO) 10 MG TABS tablet Take 1 tablet (10 mg total) by mouth daily with supper. 03/03/17  Yes [name], MD  zolpidem (AMBIEN) 5 MG tablet Take 5 mg by mouth at bedtime as needed for sleep.  01/24/13  Yes [provider]  LORazepam (ATIVAN) 0.5 MG tablet Take 1 tablet (0.5 mg total) by mouth every 6 (six) hours as needed (Nausea or vomiting). Patient not taking: Reported on 03/24/2017 03/14/17   [name], MD  prochlorperazine (COMPAZINE) 10 MG tablet Take 1 tablet (10 mg total) by mouth every 6 (six) hours as needed (Nausea or vomiting). Patient not taking: Reported on 03/24/2017 03/14/17   [name], MD  prochlorperazine (COMPAZINE) 25 MG suppository Place 1 suppository (25 mg total) rectally every 12 (twelve) hours as needed for nausea. Patient not taking: Reported on 03/24/2017 03/14/17   [name], MD    Family History Family History  Problem Relation Age  of Onset  . Emphysema Father   . Lung cancer Brother     Social History Social History   Tobacco Use  . Smoking status: Former Smoker    Packs/day: 2.00    Years: 7.00    Pack years: 14.00    Types: Cigarettes    Start date: 09/30/1950    Last attempt to quit: 04/29/1957    Years since quitting: 59.9  . Smokeless tobacco: Never Used  . Tobacco comment: quit 54 years ago  Substance Use Topics  . Alcohol use: Yes    Alcohol/week: 0.0 oz    Comment:  " one beer per month "  . Drug use: No     Allergies   Promethazine   Review of Systems Review of Systems  Constitutional: Negative for chills  and fever.  Respiratory: Negative for cough, chest tightness and shortness of breath.   Cardiovascular: Negative for chest pain, palpitations and leg swelling.  Gastrointestinal: Positive for abdominal pain and nausea. Negative for abdominal distention, diarrhea and vomiting.  Musculoskeletal: Negative for arthralgias, myalgias, neck pain and neck stiffness.  Skin: Negative for rash.  Allergic/Immunologic: Negative for immunocompromised state.  Neurological: Positive for dizziness, syncope, weakness and light-headedness. Negative for numbness and headaches.  All other systems reviewed and are negative.    Physical Exam Updated Vital Signs BP (!) 158/117   Pulse (!) 124   Temp 97.9 F (36.6 C) (Oral)   Resp (!) 25   Ht 5' 7.5" (1.715 m)   Wt 53.5 kg (118 lb)   SpO2 94%   BMI 18.21 kg/m   Physical Exam  Constitutional: He is oriented to person, place, and time. He appears well-developed and well-nourished.  Appears to be generally weak  HENT:  Head: Normocephalic.  Contusion to the left forehead.  Oral mucosa dry  Eyes: Conjunctivae and EOM are normal. Pupils are equal, round, and reactive to light.  Neck: Normal range of motion. Neck supple.  No midline tenderness  Cardiovascular:  Tachycardic, irregular  Pulmonary/Chest: Effort normal. No respiratory distress. He has  wheezes. He has rales.  Abdominal: Soft. Bowel sounds are normal. He exhibits no distension. There is tenderness. There is guarding.  Diffuse tenderness, worse in the upper abdomen.  There is bruising to the left abdomen around the injection site.  Musculoskeletal: He exhibits no edema.  Neurological: He is alert and oriented to person, place, and time. Coordination normal.  Generalized weakness  Skin: Skin is warm and dry.  Nursing note and vitals reviewed.    ED Treatments / Results  Labs (all labs ordered are listed, but only abnormal results are displayed) Labs Reviewed  BASIC METABOLIC PANEL - Abnormal; Notable for the following components:      Result Value   Chloride 98 (*)    Glucose, Bld 352 (*)    BUN 23 (*)    All other components within normal limits  CBC - Abnormal; Notable for the following components:   WBC 1.5 (*)    All other components within normal limits  CULTURE, BLOOD (ROUTINE X 2)  CULTURE, BLOOD (ROUTINE X 2)  URINALYSIS, ROUTINE W REFLEX MICROSCOPIC  HEPATIC FUNCTION PANEL  LIPASE, BLOOD  I-STAT TROPONIN, ED  I-STAT CG4 LACTIC ACID, ED  CBG MONITORING, ED    EKG ED ECG REPORT   Date: 03/24/2017  Rate: 122  Rhythm: atrial fibrillation  QRS Axis: normal  Intervals: normal and Borderline prolonged QT  ST/T Wave abnormalities: nonspecific ST/T changes  Conduction Disutrbances:none  Narrative Interpretation:   Old EKG Reviewed: changes noted Faster heart rate  I have personally reviewed the EKG tracing and agree with the computerized printout as noted.  Radiology Dg Chest Portable 1 View  Result Date: 03/24/2017 CLINICAL DATA:  Shortness of breath.  Stage IV lung cancer. EXAM: PORTABLE CHEST 1 VIEW COMPARISON:  Chest x-ray dated February 16, 2017. FINDINGS: The heart size and mediastinal contours are within normal limits. Normal pulmonary vascularity. Dense consolidation in the left lower lobe with volume loss in the left hemithorax. Probable  small left pleural effusion. The right lung is clear. No pneumothorax. No acute osseous abnormality. IMPRESSION: Dense consolidation versus atelectasis in the left lower lobe. Small left pleural effusion. Electronically Signed   By: Titus Dubin M.D.   On: 03/24/2017 16:32  Procedures Procedures (including critical care time)  Medications Ordered in ED Medications  ondansetron (ZOFRAN) injection 4 mg (not administered)  diltiazem (CARDIZEM) 1 mg/mL load via infusion 10 mg (not administered)  sodium chloride 0.9 % bolus 1,000 mL (1,000 mLs Intravenous New Bag/Given 03/24/17 1505)  morphine 4 MG/ML injection 4 mg (4 mg Intravenous Given 03/24/17 1506)     Initial Impression / Assessment and Plan / ED Course  I have reviewed the triage vital signs and the nursing notes.  Pertinent labs & imaging results that were available during my care of the patient were reviewed by me and considered in my medical decision making (see chart for details).     Patient with generalized weakness, nausea, abdominal pain, decline since last chemotherapy which was 9 days ago, unable to walk since yesterday.  Patient appears to be dry.  Diffuse tenderness over the abdomen, patient is currently nauseated.  Already ordered some blood work upfront, will add LFTs and lipase.  Will start with IV fluids and bolus Cardizem.  Patient has not had his diltiazem today.  We will continue to monitor closely.  Will get rectal temperature.  At this time his tachycardia, with atrial fibrillation, however does not otherwise qualify for SIRS criteria  4:37 PM This patient has received 1 L of fluids, has not received Cardizem or any more fluids so far.  RN informed, she is aware, will get that administered as soon as possible.  Patient refused any CT scans while in department, he understands the risks of not having CT of his head, chest, abdomen and pelvis.  He is alert and oriented x3 at this time.  He would like to have no  extensive testing or treatment at this time.  He would like to be just made comfortable.  He is okay however with the Cardizem and drip, to slow his heart rate at this time.  I spoke with admitting doctor, will admit for hydration and symptom control.  Vitals:   03/24/17 1500 03/24/17 1530 03/24/17 1600 03/24/17 1630  BP: (!) 158/117 (!) 165/114 (!) 149/107 (!) 156/113  Pulse: (!) 124 (!) 125  (!) 130  Resp: (!) 25 14 16 12   Temp:      TempSrc:      SpO2: 94% 95%  96%  Weight:      Height:         Final Clinical Impressions(s) / ED Diagnoses   Final diagnoses:  Weakness  Dehydration  Fall, initial encounter  Hyperglycemia  Atrial fibrillation with RVR [facility])    ED Discharge Orders    None       Jeannett Senior, PA-C 03/24/17 1727    Drenda Freeze, MD 03/24/17 2122

## 2017-03-24 NOTE — ED Notes (Signed)
ED TO INPATIENT HANDOFF REPORT  Name/Age/Gender [name] 84 y.o. male  Code Status    Code Status Orders  (From admission, onward)        Start     Ordered   03/24/17 1753  Do not attempt resuscitation (DNR)  Continuous    Question Answer Comment  In the event of cardiac or respiratory ARREST Do not call a "code blue"   In the event of cardiac or respiratory ARREST Do not perform Intubation, CPR, defibrillation or ACLS   In the event of cardiac or respiratory ARREST Use medication by any route, position, wound care, and other measures to relive pain and suffering. May use oxygen, suction and manual treatment of airway obstruction as needed for comfort.      03/24/17 1755    Code Status History    Date Active Date Inactive Code Status Order ID Comments User Context   03/24/2017 15:38 03/24/2017 17:55 DNR [id number]  Drenda Freeze, MD ED    Advance Directive Documentation     Most Recent Value  Type of Advance Directive  Healthcare Power of Attorney, Living will  Pre-existing out of facility DNR order (yellow form or pink MOST form)  No data  "MOST" Form in Place?  No data      Home/SNF/Other Rehab  Chief Complaint Weakness  Level of Care/Admitting Diagnosis ED Disposition    ED Disposition Condition Comment   Admit  Hospital Area: [facility] [[id number]]  Level of Care: Stepdown [14]  Admit to SDU based on following criteria: Respiratory Distress:  Frequent assessment and/or intervention to maintain adequate ventilation/respiration, pulmonary toilet, and respiratory treatment.  Admit to SDU based on following criteria: Hemodynamic compromise or significant risk of instability:  Patient requiring short term acute titration and management of vasoactive drips, and invasive monitoring (i.e., CVP and Arterial line).  Diagnosis: Sepsis [facility]) [[id number]]  Admitting Physician: Kinnie Feil [[id number]]  Attending Physician: Kinnie Feil  [[id number]]  Estimated length of stay: past midnight tomorrow  Certification:: I certify this patient will need inpatient services for at least 2 midnights  PT Class (Do Not Modify): Inpatient [101]  PT Acc Code (Do Not Modify): Private [1]       Medical History Past Medical History:  Diagnosis Date  . Adenocarcinoma of lung, stage 4, right ([place]) 03/03/2017  . Atrial fibrillation ([place])   . 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 Counseling regarding goals of care 03/03/2017  . Diabetes mellitus   . H/O echocardiogram 2003,2009,2010, 2012  . Hypertension   . Hypothyroidism   . Iron deficiency anemia, unspecified 01/27/2013  . Lung cancer ([place]) dx'd 02/2009   surg only  . Lung cancer, lower lobe ([place]) 01/27/2013  . Mass of left lung   . Metastatic lung carcinoma, left ([place]) 03/03/2017  . pancreatic ca dx'd 2004   oral chemo/xrt comp  . Wears glasses     Allergies Allergies  Allergen Reactions  . Promethazine Other (See Comments)    Makes hyper    IV Location/Drains/Wounds Patient Lines/Drains/Airways Status   Active Line/Drains/Airways    Name:   Placement date:   Placement time:   Site:   Days:   Peripheral IV 03/24/17 Left Antecubital   03/24/17    1329    Antecubital   less than 1   Incision (Closed) 02/16/17 N/A Other (Comment)   02/16/17    1144     36  Labs/Imaging Results for orders placed or performed during the hospital encounter of 03/24/17 (from the past 48 hour(s))  Basic metabolic panel     Status: Abnormal   Collection Time: 03/24/17  2:18 PM  Result Value Ref Range   Sodium 138 135 - 145 mmol/L   Potassium 3.8 3.5 - 5.1 mmol/L   Chloride 98 (L) 101 - 111 mmol/L   CO2 27 22 - 32 mmol/L   Glucose, Bld 352 (H) 65 - 99 mg/dL   BUN 23 (H) 6 - 20 mg/dL   Creatinine, Ser 0.98 0.61 - 1.24 mg/dL   Calcium 9.1 8.9 - 10.3 mg/dL   GFR calc non Af Amer >60 >60 mL/min   GFR calc Af Amer >60 >60 mL/min    Comment: (NOTE) The eGFR has been  calculated using the CKD EPI equation. This calculation has not been validated in all clinical situations. eGFR's persistently <60 mL/min signify possible Chronic Kidney Disease.    Anion gap 13 5 - 15    Comment: Performed at [facility], [place] [address] [place], [place] [address]  CBC     Status: Abnormal   Collection Time: 03/24/17  2:18 PM  Result Value Ref Range   WBC 1.5 (L) 4.0 - 10.5 K/uL   RBC 4.38 4.22 - 5.81 MIL/uL   Hemoglobin 13.9 13.0 - 17.0 g/dL   HCT 40.0 39.0 - 52.0 %   MCV 91.3 78.0 - 100.0 fL   MCH 31.7 26.0 - 34.0 pg   MCHC 34.8 30.0 - 36.0 g/dL   RDW 13.6 11.5 - 15.5 %   Platelets 28 (LL) 150 - 400 K/uL    Comment: REPEATED TO VERIFY SPECIMEN CHECKED FOR CLOTS PLATELET COUNT CONFIRMED BY SMEAR CRITICAL RESULT CALLED TO, READ BACK BY AND VERIFIED WITH: BOLDEN, K AT 1527 ON 03/24/17 BY MOHAMED, A Performed at [facility], [place] [address] [place], [place] [address]   Hepatic function panel     Status: Abnormal   Collection Time: 03/24/17  2:18 PM  Result Value Ref Range   Total Protein 6.4 (L) 6.5 - 8.1 g/dL   Albumin 3.5 3.5 - 5.0 g/dL   AST 16 15 - 41 U/L   ALT 25 17 - 63 U/L   Alkaline Phosphatase 95 38 - 126 U/L   Total Bilirubin 0.9 0.3 - 1.2 mg/dL   Bilirubin, Direct 0.2 0.1 - 0.5 mg/dL   Indirect Bilirubin 0.7 0.3 - 0.9 mg/dL    Comment: Performed at [facility], [place] [address], [place], [place] [address]  Lipase, blood     Status: None   Collection Time: 03/24/17  2:18 PM  Result Value Ref Range   Lipase 16 11 - 51 U/L    Comment: Performed at [facility], [place] [address] [place], [place] [address]  Differential     Status: Abnormal   Collection Time: 03/24/17  2:18 PM  Result Value Ref Range   Neutrophils Relative % 59 %   Neutro Abs 1.0 (L) 1.7 - 7.7 K/uL   Lymphocytes Relative 26 %   Lymphs Abs 0.4 (L) 0.7 - 4.0 K/uL   Monocytes Relative 14 %   Monocytes  Absolute 0.2 0.1 - 1.0 K/uL   Eosinophils Relative 1 %   Eosinophils Absolute 0.0 0.0 - 0.7 K/uL   Basophils Relative 0 %   Basophils Absolute 0.0 0.0 - 0.1 K/uL    Comment: Performed at [facility], [place] [address] [place], [place] [address]  I-stat troponin, ED     Status: None   Collection Time: 03/24/17  2:30 PM  Result Value Ref Range   Troponin i, poc 0.02 0.00 - 0.08 ng/mL   Comment 3            Comment: Due to the release kinetics of cTnI, a negative result within the first hours of the onset of symptoms does not rule out myocardial infarction with certainty. If myocardial infarction is still suspected, repeat the test at appropriate intervals.   I-Stat CG4 Lactic Acid, ED     Status: None   Collection Time: 03/24/17  3:09 PM  Result Value Ref Range   Lactic Acid, Venous 1.02 0.5 - 1.9 mmol/L   Dg Chest Portable 1 View  Result Date: 03/24/2017 CLINICAL DATA:  Shortness of breath.  Stage IV lung cancer. EXAM: PORTABLE CHEST 1 VIEW COMPARISON:  Chest x-ray dated February 16, 2017. FINDINGS: The heart size and mediastinal contours are within normal limits. Normal pulmonary vascularity. Dense consolidation in the left lower lobe with volume loss in the left hemithorax. Probable small left pleural effusion. The right lung is clear. No pneumothorax. No acute osseous abnormality. IMPRESSION: Dense consolidation versus atelectasis in the left lower lobe. Small left pleural effusion. Electronically Signed   By: Titus Dubin M.D.   On: 03/24/2017 16:32    Pending Labs Unresulted Labs (From admission, onward)   Start     Ordered   03/25/17 0500  Creatinine, serum  Every 48 hours,   R     03/24/17 1744   03/25/17 7322  Basic metabolic panel  Tomorrow morning,   R     03/24/17 1755   03/25/17 0500  CBC  Tomorrow morning,   R     03/24/17 1755   03/24/17 1518  Blood culture (routine x 2)  BLOOD CULTURE X 2,   STAT     03/24/17 1517   03/24/17 1346  Urinalysis,  Routine w reflex microscopic  Once,   STAT     03/24/17 1345      Vitals/Pain Today's Vitals   03/24/17 1630 03/24/17 1642 03/24/17 1700 03/24/17 1730  BP: (!) 156/113  (!) 147/108 (!) 156/113  Pulse: (!) 130     Resp: 12  (!) 21 15  Temp:      TempSrc:      SpO2: 96%     Weight:      Height:      PainSc:  9       Isolation Precautions No active isolations  Medications Medications  iopamidol (ISOVUE-300) 61 % injection (not administered)  morphine 2 MG/ML injection 2-4 mg (2 mg Intravenous Given 03/24/17 1758)  ondansetron (ZOFRAN) injection 4 mg (4 mg Intravenous Given 03/24/17 1757)  ceFEPIme (MAXIPIME) 1 g in sodium chloride 0.9 % 100 mL IVPB (not administered)  vancomycin (VANCOCIN) IVPB 1000 mg/200 mL premix (not administered)    Followed by  vancomycin (VANCOCIN) IVPB 750 mg/150 ml premix (not administered)  LORazepam (ATIVAN) tablet 0.5 mg (not administered)  diltiazem (DILACOR XR) 24 hr capsule 240 mg (not administered)  BASAGLAR KWIKPEN KwikPen 10 Units (not administered)  levothyroxine (SYNTHROID, LEVOTHROID) tablet 100 mcg (not administered)  insulin aspart (novoLOG) injection 0-9 Units (not administered)  0.9 %  sodium chloride infusion (not administered)  ipratropium-albuterol (DUONEB) 0.5-2.5 (3) MG/3ML nebulizer solution 3 mL (not administered)  sodium chloride 0.9 % bolus 1,000 mL (0 mLs Intravenous  Stopped 03/24/17 1614)  morphine 4 MG/ML injection 4 mg (4 mg Intravenous Given 03/24/17 1506)  ondansetron (ZOFRAN) injection 4 mg (4 mg Intravenous Given 03/24/17 1522)  diltiazem (CARDIZEM) injection 10 mg (10 mg Intravenous Given 03/24/17 1738)  sodium chloride 0.9 % bolus 1,000 mL (0 mLs Intravenous Stopped 03/24/17 1737)  0.9 %  sodium chloride infusion ( Intravenous New Bag/Given 03/24/17 1737)    Mobility Walks with assistance

## 2017-03-24 NOTE — ED Notes (Signed)
Waiting for cardizem to be tubed up by pharmacy

## 2017-03-25 ENCOUNTER — Other Ambulatory Visit: Payer: Self-pay

## 2017-03-25 DIAGNOSIS — W19XXXA Unspecified fall, initial encounter: Secondary | ICD-10-CM

## 2017-03-25 DIAGNOSIS — C7951 Secondary malignant neoplasm of bone: Secondary | ICD-10-CM

## 2017-03-25 DIAGNOSIS — S0990XA Unspecified injury of head, initial encounter: Secondary | ICD-10-CM

## 2017-03-25 DIAGNOSIS — Z66 Do not resuscitate: Secondary | ICD-10-CM

## 2017-03-25 DIAGNOSIS — C782 Secondary malignant neoplasm of pleura: Secondary | ICD-10-CM

## 2017-03-25 LAB — BASIC METABOLIC PANEL
ANION GAP: 9 (ref 5–15)
BUN: 17 mg/dL (ref 6–20)
CALCIUM: 8.2 mg/dL — AB (ref 8.9–10.3)
CO2: 27 mmol/L (ref 22–32)
Chloride: 103 mmol/L (ref 101–111)
Creatinine, Ser: 0.85 mg/dL (ref 0.61–1.24)
GFR calc Af Amer: >60 mL/min (ref >60)
GFR calc non Af Amer: >60 mL/min (ref >60)
GLUCOSE: 267 mg/dL — AB (ref 65–99)
Potassium: 3.4 mmol/L — ABNORMAL LOW (ref 3.5–5.1)
Sodium: 139 mmol/L (ref 135–145)

## 2017-03-25 LAB — CBC
HCT: 35.2 % — ABNORMAL LOW (ref 39.0–52.0)
Hemoglobin: 11.9 g/dL — ABNORMAL LOW (ref 13.0–17.0)
MCH: 31.4 pg (ref 26.0–34.0)
MCHC: 33.8 g/dL (ref 30.0–36.0)
MCV: 92.9 fL (ref 78.0–100.0)
PLATELETS: 22 10*3/uL — AB (ref 150–400)
RBC: 3.79 MIL/uL — ABNORMAL LOW (ref 4.22–5.81)
RDW: 13.7 % (ref 11.5–15.5)
WBC: 2 10*3/uL — AB (ref 4.0–10.5)

## 2017-03-25 LAB — GLUCOSE, CAPILLARY
GLUCOSE-CAPILLARY: 242 mg/dL — AB (ref 65–99)
GLUCOSE-CAPILLARY: 222 mg/dL — AB (ref 65–99)
GLUCOSE-CAPILLARY: 148 mg/dL — AB (ref 65–99)
Glucose-Capillary: 107 mg/dL — ABNORMAL HIGH (ref 65–99)
Glucose-Capillary: 145 mg/dL — ABNORMAL HIGH (ref 65–99)

## 2017-03-25 LAB — MRSA PCR SCREENING: MRSA BY PCR: NEGATIVE

## 2017-03-25 MED ORDER — METOPROLOL TARTRATE 5 MG/5ML IV SOLN
5.0000 mg | INTRAVENOUS | Status: AC | PRN
  Administered 2017-03-29 – 2017-03-31 (×2): 5 mg via INTRAVENOUS
  Filled 2017-03-25 (×3): qty 5

## 2017-03-25 MED ORDER — DILTIAZEM HCL ER COATED BEADS 120 MG PO CP24
240.0000 mg | ORAL_CAPSULE | Freq: Every day | ORAL | Status: DC
  Administered 2017-03-25 – 2017-04-02 (×9): 240 mg via ORAL
  Filled 2017-03-25 (×9): qty 2

## 2017-03-25 NOTE — Consult Note (Signed)
HEMATOLOGY-ONCOLOGY PROGRESS NOTE  SUBJECTIVE: Metastatic adenocarcinoma the lung with lymph node pleural and bone metastases status post 1 cycle of chemotherapy with carboplatin, Alimta and pembrolizumab Previous history of adenocarcinoma of ampulla of Vater Patient is admitted with right lower quadrant abdominal pain as well as a fall with head injury. He was apparently unconscious for a brief period.  OBJECTIVE: REVIEW OF SYSTEMS:   Constitutional: Severe generalized fatigue and weakness, bruise on the head Eyes: Denies blurriness of vision Ears, nose, mouth, throat, and face: Denies mucositis or sore throat Respiratory: Denies cough, dyspnea or wheezes Cardiovascular: Denies palpitation, chest discomfort Gastrointestinal:  Denies nausea, heartburn or change in bowel habits Skin: Denies abnormal skin rashes Lymphatics: Patient had a large palpable lymph node in his right groin which has improved Neurological severe generalized weaknesses Behavioral/Psych: Mood is stable, no new changes  Extremities: No lower extremity edema All other systems were reviewed with the patient and are negative.  PHYSICAL EXAMINATION: ECOG PERFORMANCE STATUS: 3 - Symptomatic, >50% confined to bed  Vitals:   03/25/17 0400 03/25/17 0500  BP: (!) 95/57 (!) 144/65  Pulse: 76 72  Resp: (!) 8 12  Temp: (!) 97 F (36.1 C)   SpO2: 96% 94%   Filed Weights   03/24/17 1334  Weight: 118 lb (53.5 kg)    GENERAL:alert, no distress and comfortable SKIN: skin color, texture, turgor are normal, no rashes or significant lesions EYES: normal, Conjunctiva are pink and non-injected, sclera clear OROPHARYNX:no exudate, no erythema and lips, buccal mucosa, and tongue normal  NECK: supple, thyroid normal size, non-tender, without nodularity LYMPH:  no palpable lymphadenopathy in the cervical, axillary or inguinal LUNGS: clear to auscultation and percussion with normal breathing effort HEART:  Irregular ABDOMEN:abdomen soft, non-tender and normal bowel sounds Musculoskeletal:no cyanosis of digits and no clubbing  NEURO: alert & oriented x 3 with fluent speech, no focal motor/sensory deficits  LABORATORY DATA:  I have reviewed the data as listed CMP Latest Ref Rng & Units 03/24/2017 03/14/2017 03/03/2017  Glucose 65 - 99 mg/dL 352(H) 310(H) 364(H)  BUN 6 - 20 mg/dL 23(H) 14 15  Creatinine 0.61 - 1.24 mg/dL 0.98 1.20 1.00  Sodium 135 - 145 mmol/L 138 136 141  Potassium 3.5 - 5.1 mmol/L 3.8 3.7 4.5  Chloride 101 - 111 mmol/L 98(L) 96(L) 101  CO2 22 - 32 mmol/L 27 31 30   Calcium 8.9 - 10.3 mg/dL 9.1 9.5 9.4  Total Protein 6.5 - 8.1 g/dL 6.4(L) 6.9 6.8  Total Bilirubin 0.3 - 1.2 mg/dL 0.9 1.0 1.0  Alkaline Phos 38 - 126 U/L 95 115(H) 104(H)  AST 15 - 41 U/L 16 24 20   ALT 17 - 63 U/L 25 22 18     Lab Results  Component Value Date   WBC 1.5 (L) 03/24/2017   HGB 13.9 03/24/2017   HCT 40.0 03/24/2017   MCV 91.3 03/24/2017   PLT 28 (LL) 03/24/2017   NEUTROABS 1.0 (L) 03/24/2017    ASSESSMENT AND PLAN: 1.  Metastatic adenocarcinoma of the lung with lymph node pleural and bone metastases: Status post first cycle of chemotherapy with carboplatin and Alimta and pembrolizumab Patient informed me that he is not planning on receiving any further chemotherapy based upon the first treatment. He does not even want to do any workup for the head injury or his abdominal pain. He wishes to be kept comfortable. He would like to stay in the hospital for 1 more day until his pain gets under better control. 2.  Will consult to hospice care 3.  Patient can be moved out of the intensive care unit. 4.  DNR CC I will inform Dr. Marin Olp of the patient's decision.

## 2017-03-25 NOTE — Progress Notes (Signed)
RN notified that patients HR dropped into the 40's did not sustain there and rebounded into the 60's after a few seconds x2 episodes, pt. Asymptomatic throughout.  PO Cardizem held at this time and Dr. Doyle Askew updated on patients condition, received no additional orders at this times.  Will continue to monitor.

## 2017-03-25 NOTE — Progress Notes (Signed)
Patient ID: [name], male   DOB: [birth year], 83 y.o.   MRN: [id number]    PROGRESS NOTE    [name]  QIW:[id number] DOB: 02-17-1933 DOA: 03/24/2017  PCP: Lavone Orn, MD   Brief Narrative:  Pt is 84 yo male with HTN, hypothyroidism, stage IV lung cancer, pancreatic ca, recent chemo 9 days PTA, presented with abd pain, nausea and cough, generalized weakness. Pt reports associated dyspnea with exertion and difficulty performing ADL. In ED, pt has refused further imaging studies, asked to focus on comfort and confirmed DNR status. Imaging studies confirmed PNA and pt was started on ABX. Pt was OK with abx regimen and asked for palliative care consultation.   Assessment & Plan:   A fib RVR.  - HR is now down to 40-50's - will hold Diltiazem for today - keep on monitor in SDU for now - no AC due to thrombocytopenia   Acute res[piratory failure with hypoxia  - in pt with known stage IV lung cancer, possible post obstructive pneumonia - will continue current regimen with vancomycin and cefepime - keep on oxygen via [place] - pt has declined blood cultures   FTT, severe PCM - liberalize diet  Thrombocytopenia - chemotherapy induced   DVT prophylaxis: SCD's Code Status: DNR Family Communication: Patient and family at bedside  Disposition Plan: to be determined   Consultants:   PCT  Procedures:   None  Antimicrobials:   Vancomycin 02/15 -->  Cefepime 02/15 -->  Subjective: Pt reports feeling better.  Objective: Vitals:   03/25/17 1917 03/25/17 1920 03/25/17 1921 03/25/17 1955  BP:      Pulse: (!) 122 (!) 120 (!) 116   Resp: 19 15 20    Temp:    97.6 F (36.4 C)  TempSrc:    Oral  SpO2: 97% 99% 99%   Weight:      Height:        Intake/Output Summary (Last 24 hours) at 03/25/2017 2024 Last data filed at 03/25/2017 1800 Gross per 24 hour  Intake 2353.33 ml  Output 325 ml  Net 2028.33 ml   Filed Weights   03/24/17 1334  Weight: 53.5 kg (118  lb)    Examination:  General exam: Appears calm and comfortable  Respiratory system: rhonchi at bases with mild wheezing  Cardiovascular system: bradycardia, No JVD, murmurs, rubs, gallops or clicks. No pedal edema. Gastrointestinal system: Abdomen is nondistended, soft and nontender. No organomegaly or masses felt. Normal bowel sounds heard. Central nervous system: Alert and oriented. No focal neurological deficits.  Data Reviewed: I have personally reviewed following labs and imaging studies  CBC: Recent Labs  Lab 03/24/17 1418 03/25/17 0538  WBC 1.5* 2.0*  NEUTROABS 1.0*  --   HGB 13.9 11.9*  HCT 40.0 35.2*  MCV 91.3 92.9  PLT 28* 22*   Basic Metabolic Panel: Recent Labs  Lab 03/24/17 1418 03/25/17 0538  NA 138 139  K 3.8 3.4*  CL 98* 103  CO2 27 27  GLUCOSE 352* 267*  BUN 23* 17  CREATININE 0.98 0.85  CALCIUM 9.1 8.2*   Liver Function Tests: Recent Labs  Lab 03/24/17 1418  AST 16  ALT 25  ALKPHOS 95  BILITOT 0.9  PROT 6.4*  ALBUMIN 3.5   Recent Labs  Lab 03/24/17 1418  LIPASE 16   CBG: Recent Labs  Lab 03/24/17 2204 03/25/17 0729 03/25/17 1316 03/25/17 1534  GLUCAP 250* 242* 222* 148*   Urine analysis:    Component  Value Date/Time   COLORURINE YELLOW 03/24/2017 1820   APPEARANCEUR CLEAR 03/24/2017 1820   LABSPEC 1.017 03/24/2017 1820   PHURINE 5.0 03/24/2017 1820   GLUCOSEU >=500 (A) 03/24/2017 1820   HGBUR SMALL (A) 03/24/2017 1820   BILIRUBINUR NEGATIVE 03/24/2017 1820   KETONESUR 20 (A) 03/24/2017 1820   PROTEINUR 30 (A) 03/24/2017 1820   UROBILINOGEN 0.2 03/03/2008 1022   NITRITE NEGATIVE 03/24/2017 1820   LEUKOCYTESUR NEGATIVE 03/24/2017 1820   Recent Results (from the past 240 hour(s))  Blood culture (routine x 2)     Status: None (Preliminary result)   Collection Time: 03/24/17  8:40 PM  Result Value Ref Range Status   Specimen Description   Final    BLOOD RIGHT ARM Performed at [facility], [place]  [address] [place], [place] [address]    Special Requests   Final    BOTTLES DRAWN AEROBIC AND ANAEROBIC Blood Culture adequate volume Performed at [facility] [address], [place], [place] [address]    Culture   Final    NO GROWTH < 24 HOURS Performed at [facility] [address], [place], [place] [address]    Report Status PENDING  Incomplete  Blood culture (routine x 2)     Status: None (Preliminary result)   Collection Time: 03/24/17  8:57 PM  Result Value Ref Range Status   Specimen Description   Final    BLOOD LEFT HAND Performed at [facility] [address], [place], [place] [address]    Special Requests   Final    BOTTLES DRAWN AEROBIC ONLY Blood Culture adequate volume Performed at [facility] [address], [place], [place] [address]    Culture   Final    NO GROWTH < 24 HOURS Performed at [facility] [address], [place], [place] [address]    Report Status PENDING  Incomplete  MRSA PCR Screening     Status: None   Collection Time: 03/24/17 10:48 PM  Result Value Ref Range Status   MRSA by PCR NEGATIVE NEGATIVE Final    Comment:        The GeneXpert MRSA Assay (FDA approved for NASAL specimens only), is one component of a comprehensive MRSA colonization surveillance program. It is not intended to diagnose MRSA infection nor to guide or monitor treatment for MRSA infections. Performed at [facility], [place] [address], [place], [place] [address]       Radiology Studies: Dg Chest Portable 1 View  Result Date: 03/24/2017 CLINICAL DATA:  Shortness of breath.  Stage IV lung cancer. EXAM: PORTABLE CHEST 1 VIEW COMPARISON:  Chest x-ray dated February 16, 2017. FINDINGS: The heart size and mediastinal contours are within normal limits. Normal pulmonary vascularity. Dense consolidation in the left lower lobe with volume loss in the left hemithorax. Probable small left  pleural effusion. The right lung is clear. No pneumothorax. No acute osseous abnormality. IMPRESSION: Dense consolidation versus atelectasis in the left lower lobe. Small left pleural effusion. Electronically Signed   By: Titus Dubin M.D.   On: 03/24/2017 16:32    Scheduled Meds: . diltiazem  240 mg Oral Daily  . insulin aspart  0-9 Units Subcutaneous TID WC  . insulin glargine  10 Units Subcutaneous QHS  . levothyroxine  100 mcg Oral QAC breakfast   Continuous Infusions: . ceFEPime (MAXIPIME) IV Stopped (03/25/17 1818)  . vancomycin     Followed by  .  vancomycin       LOS: 1 day   Time spent: 25 minutes   Faye Ramsay, MD [facility] Pager [phone]  If 7PM-7AM, please contact night-coverage [email or web address] Password [facility] 03/25/2017, 8:24 PM

## 2017-03-26 DIAGNOSIS — C3490 Malignant neoplasm of unspecified part of unspecified bronchus or lung: Secondary | ICD-10-CM

## 2017-03-26 DIAGNOSIS — Z515 Encounter for palliative care: Secondary | ICD-10-CM

## 2017-03-26 DIAGNOSIS — Z7189 Other specified counseling: Secondary | ICD-10-CM

## 2017-03-26 LAB — GLUCOSE, CAPILLARY
GLUCOSE-CAPILLARY: 68 mg/dL (ref 65–99)
GLUCOSE-CAPILLARY: 66 mg/dL (ref 65–99)
GLUCOSE-CAPILLARY: 97 mg/dL (ref 65–99)
Glucose-Capillary: 69 mg/dL (ref 65–99)
Glucose-Capillary: 155 mg/dL — ABNORMAL HIGH (ref 65–99)
Glucose-Capillary: 74 mg/dL (ref 65–99)
Glucose-Capillary: 142 mg/dL — ABNORMAL HIGH (ref 65–99)

## 2017-03-26 LAB — RAPID HIV SCREEN (HIV 1/2 AB+AG)
HIV 1/2 ANTIBODIES: NONREACTIVE
HIV-1 P24 Antigen - HIV24: NONREACTIVE

## 2017-03-26 MED ORDER — GLUCOSE 40 % PO GEL
ORAL | Status: AC
  Administered 2017-03-26: 37.5 g
  Filled 2017-03-26: qty 1

## 2017-03-26 NOTE — Plan of Care (Signed)
  Progressing Clinical Measurements: Ability to maintain clinical measurements within normal limits will improve 03/26/2017 1845 - Progressing by Naomie Dean, RN Pain Managment: General experience of comfort will improve 03/26/2017 1845 - Progressing by Naomie Dean, RN

## 2017-03-26 NOTE — Progress Notes (Signed)
Patient ID: [name], male   DOB: 12/04/1933, 84 y.o.   MRN: [id number]    PROGRESS NOTE    [name]  YWV:[id number] DOB: Jun 09, 1933 DOA: 03/24/2017  PCP: Lavone Orn, MD   Brief Narrative:  Pt is 84 yo male with HTN, hypothyroidism, stage IV lung cancer, pancreatic ca, recent chemo 9 days PTA, presented with abd pain, nausea and cough, generalized weakness. Pt reports associated dyspnea with exertion and difficulty performing ADL. In ED, pt has refused further imaging studies, asked to focus on comfort and confirmed DNR status. Imaging studies confirmed PNA and pt was started on ABX. Pt was OK with abx regimen and asked for palliative care consultation.   Assessment & Plan:   A fib RVR.  - HR is now down to 100's this AM - continue home medical regimen with Diltiazem - no AC due to thrombocytopenia   Acute res[piratory failure with hypoxia  - in pt with known stage IV lung cancer, possible post obstructive pneumonia - will continue current regimen with vancomycin and cefepime - keep on oxygen via [place] - reassess clinical status in AM  Hypokalemia - repeat BMP in AM  FTT, severe PCM - liberalize diet  Thrombocytopenia - chemotherapy induced - will check CBC in AM   DVT prophylaxis: SCD's Code Status: DNR Family Communication: pt at bedside  Disposition Plan: to be determined   Consultants:   PCT  Procedures:   None  Antimicrobials:   Vancomycin 02/15 -->  Cefepime 02/15 -->  Subjective: Pt reports feeling better.   Objective: Vitals:   03/26/17 1436 03/26/17 1500 03/26/17 1600 03/26/17 1700  BP:  105/63 121/83 122/73  Pulse: 62 63 62 73  Resp: 11 11 16 14   Temp:      TempSrc:      SpO2: 100% 99% 100% 99%  Weight:      Height:        Intake/Output Summary (Last 24 hours) at 03/26/2017 1726 Last data filed at 03/26/2017 1300 Gross per 24 hour  Intake 830 ml  Output 875 ml  Net -45 ml   Filed Weights   03/24/17 1334  Weight:  53.5 kg (118 lb)    Examination: Constitutional: Appears well-developed and well-nourished. No distress.  CVS: RRR, S1/S2 +, no murmurs, no gallops, no carotid bruit.  Pulmonary: rhonchi at bases with no wheezing and no tachypnea  Abdominal: Soft. BS +,  no distension, tenderness, rebound or guarding.  Musculoskeletal: Normal range of motion. No edema and no tenderness.  Psychiatric: Normal mood and affect. Behavior, judgment, thought content normal.   Data Reviewed: I have personally reviewed following labs and imaging studies  CBC: Recent Labs  Lab 03/24/17 1418 03/25/17 0538  WBC 1.5* 2.0*  NEUTROABS 1.0*  --   HGB 13.9 11.9*  HCT 40.0 35.2*  MCV 91.3 92.9  PLT 28* 22*   Basic Metabolic Panel: Recent Labs  Lab 03/24/17 1418 03/25/17 0538  NA 138 139  K 3.8 3.4*  CL 98* 103  CO2 27 27  GLUCOSE 352* 267*  BUN 23* 17  CREATININE 0.98 0.85  CALCIUM 9.1 8.2*   Liver Function Tests: Recent Labs  Lab 03/24/17 1418  AST 16  ALT 25  ALKPHOS 95  BILITOT 0.9  PROT 6.4*  ALBUMIN 3.5   Recent Labs  Lab 03/24/17 1418  LIPASE 16   CBG: Recent Labs  Lab 03/26/17 0810 03/26/17 0835 03/26/17 0901 03/26/17 1159 03/26/17 1605  GLUCAP 68 66  97 155* 74   Urine analysis:    Component Value Date/Time   COLORURINE YELLOW 03/24/2017 1820   APPEARANCEUR CLEAR 03/24/2017 1820   LABSPEC 1.017 03/24/2017 1820   PHURINE 5.0 03/24/2017 1820   GLUCOSEU >=500 (A) 03/24/2017 1820   HGBUR SMALL (A) 03/24/2017 1820   BILIRUBINUR NEGATIVE 03/24/2017 1820   KETONESUR 20 (A) 03/24/2017 1820   PROTEINUR 30 (A) 03/24/2017 1820   UROBILINOGEN 0.2 03/03/2008 1022   NITRITE NEGATIVE 03/24/2017 1820   LEUKOCYTESUR NEGATIVE 03/24/2017 1820   Recent Results (from the past 240 hour(s))  Blood culture (routine x 2)     Status: None (Preliminary result)   Collection Time: 03/24/17  8:40 PM  Result Value Ref Range Status   Specimen Description   Final    BLOOD RIGHT  ARM Performed at [facility], [place] [address], [place], [place] [address]    Special Requests   Final    BOTTLES DRAWN AEROBIC AND ANAEROBIC Blood Culture adequate volume Performed at [facility] [address] [place], [place] [address]    Culture   Final    NO GROWTH 2 DAYS Performed at [facility] [address], [place], [place] [address]    Report Status PENDING  Incomplete  Blood culture (routine x 2)     Status: None (Preliminary result)   Collection Time: 03/24/17  8:57 PM  Result Value Ref Range Status   Specimen Description   Final    BLOOD LEFT HAND Performed at [facility] [address] [place], [place] [address]    Special Requests   Final    BOTTLES DRAWN AEROBIC ONLY Blood Culture adequate volume Performed at [facility] [address] [place], [place] [address]    Culture   Final    NO GROWTH 2 DAYS Performed at [facility] [address], [place], [place] [address]    Report Status PENDING  Incomplete  MRSA PCR Screening     Status: None   Collection Time: 03/24/17 10:48 PM  Result Value Ref Range Status   MRSA by PCR NEGATIVE NEGATIVE Final    Comment:        The GeneXpert MRSA Assay (FDA approved for NASAL specimens only), is one component of a comprehensive MRSA colonization surveillance program. It is not intended to diagnose MRSA infection nor to guide or monitor treatment for MRSA infections. Performed at [facility], [place] [address] [place],  [address]       Radiology Studies: No results found.  Scheduled Meds: . diltiazem  240 mg Oral Daily  . insulin aspart  0-9 Units Subcutaneous TID WC  . insulin glargine  10 Units Subcutaneous QHS  . levothyroxine  100 mcg Oral QAC breakfast   Continuous Infusions: . ceFEPime (MAXIPIME) IV Stopped (03/26/17 0539)  . vancomycin     Followed by  . vancomycin Stopped  (03/25/17 2300)     LOS: 2 days   Time spent: 25 minutes   Faye Ramsay, MD [facility] Pager [phone]  If 7PM-7AM, please contact night-coverage [email or web address] Password [facility] 03/26/2017, 5:26 PM

## 2017-03-26 NOTE — Consult Note (Signed)
Consultation Note Date: 03/26/2017   Patient Name: [name]  DOB: [birth year]  MRN: [id number]  Age / Sex: 84 y.o., male  PCP: Lavone Orn, MD Referring Physician: Theodis Blaze, MD  Reason for Consultation: Establishing goals of care  HPI/Patient Profile: 84 y.o. male  with past medical history of attention, hypothyroidism, stage IV lung cancer, prior pancreatic cancer admitted on 03/24/2017 with A. fib with RVR and respiratory failure likely secondary to pneumonia.  On arrival to ED he had agreed to antibiotics and the rate control but did not want further aggressive interventions.  Palliative consulted for goals of care.  Clinical Assessment and Goals of Care: I met today with [name], his 2 daughters, and his wife.   We discussed clinical course as well as wishes moving forward in regard to advanced directives.  Concepts specific to code status and rehospitalization discussed.  We discussed difference between a aggressive medical intervention path and a palliative, comfort focused care path.  Values and goals of care important to patient and family were attempted to be elicited.  Concept of Hospice and Palliative Care were discussed  Questions and concerns addressed.   PMT will continue to support holistically.   SUMMARY OF RECOMMENDATIONS   -Family reports that patient was set on not pursuing further chemotherapy or interventions yesterday.  Today, however, he is feeling better after receiving some supportive care and wants to continue with current therapies. -He is now stating that he is not sure what he wants to do regarding his cancer treatments moving forward.  He would like to discuss this further with Dr. Marin Olp prior to making any long-term decisions. -Recommend continuation of current interventions and evaluation by Dr. Marin Olp.  He does not want to escalate care if it appears he  is not going to get well enough to return to his own home.  Code Status/Advance Care Planning: DNR   Prognosis:   Unable to determine  Discharge Planning: To Be Determined      Primary Diagnoses: Present on Admission: . Sepsis ([place])   I have reviewed the medical record, interviewed the patient and family, and examined the patient. The following aspects are pertinent.  Past Medical History:  Diagnosis Date  . Adenocarcinoma of lung, stage 4, right ([place]) 03/03/2017  . Atrial fibrillation ([place])   . 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 Counseling regarding goals of care 03/03/2017  . Diabetes mellitus   . H/O echocardiogram 2003,2009,2010, 2012  . Hypertension   . Hypothyroidism   . Iron deficiency anemia, unspecified 01/27/2013  . Lung cancer ([place]) dx'd 02/2009   surg only  . Lung cancer, lower lobe ([place]) 01/27/2013  . Mass of left lung   . Metastatic lung carcinoma, left ([place]) 03/03/2017  . pancreatic ca dx'd 2004   oral chemo/xrt comp  . Wears glasses    Social History   Socioeconomic History  . Marital status: Married    Spouse name: None  . Number of children: None  . Years  of education: None  . Highest education level: None  Social Needs  . Financial resource strain: None  . Food insecurity - worry: None  . Food insecurity - inability: None  . Transportation needs - medical: None  . Transportation needs - non-medical: None  Occupational History  . None  Tobacco Use  . Smoking status: Former Smoker    Packs/day: 2.00    Years: 7.00    Pack years: 14.00    Types: Cigarettes    Start date: 09/30/1950    Last attempt to quit: 04/29/1957    Years since quitting: 59.9  . Smokeless tobacco: Never Used  . Tobacco comment: quit 54 years ago  Substance and Sexual Activity  . Alcohol use: Yes    Alcohol/week: 0.0 oz    Comment:  " one beer per month "  . Drug use: No  . Sexual activity: None  Other Topics Concern  . None  Social History Narrative    . None   Family History  Problem Relation Age of Onset  . Emphysema Father   . Lung cancer Brother    Scheduled Meds: . diltiazem  240 mg Oral Daily  . insulin aspart  0-9 Units Subcutaneous TID WC  . insulin glargine  10 Units Subcutaneous QHS  . levothyroxine  100 mcg Oral QAC breakfast   Continuous Infusions: . ceFEPime (MAXIPIME) IV Stopped (03/26/17 0539)  . vancomycin     Followed by  . vancomycin Stopped (03/25/17 2300)   PRN Meds:.ipratropium-albuterol, metoprolol tartrate, morphine injection, ondansetron (ZOFRAN) IV Medications Prior to Admission:  Prior to Admission medications   Medication Sig Start Date End Date Taking? Authorizing Provider  atorvastatin (LIPITOR) 10 MG tablet Take 10 mg by mouth 2 (two) times a week. Monday & Friday. 09/22/11  Yes [provider]  dexamethasone (DECADRON) 4 MG tablet Take 1 tab two times a day the day before Alimta chemo. Take 2 tabs two times a day starting the day after chemo for 3 days. 03/14/17  Yes [name], MD  diltiazem (CARDIZEM) 60 MG tablet Take 60 mg by mouth 3 (three) times daily as needed (for heart palpitations).    Yes [provider]  diltiazem (DILACOR XR) 240 MG 24 hr capsule Take 240 mg by mouth daily.   Yes [provider]  folic acid (FOLVITE) 1 MG tablet Take 1 tablet daily. Take daily starting 5-7 days before Alimta chemotherapy. Continue until 21 days after Alimta completed. 03/14/17  Yes [name], MD  glimepiride (AMARYL) 2 MG tablet Take 2 tablets (4 mg total) by mouth daily before breakfast. 03/03/17  Yes Ennever, Rudell Cobb, MD  insulin aspart (FIASP) 100 UNIT/ML injection Inject 0-10 Units into the skin 3 (three) times daily before meals. PER SLIDING SCALE BEFORE MEALS   Yes [provider]  Insulin Glargine (BASAGLAR KWIKPEN) 100 UNIT/ML SOPN Inject 10 Units into the skin at bedtime.   Yes [provider]  levothyroxine (SYNTHROID, LEVOTHROID) 100 MCG  tablet Take 100 mcg by mouth daily before breakfast.  12/20/13  Yes [provider]  metFORMIN (GLUCOPHAGE-XR) 500 MG 24 hr tablet Take 500-1,000 mg by mouth 2 (two) times daily. Take 2 tablets (1000 mg) in the morning, and 1 tablet (500 mg) in the evening.   Yes [provider]  ondansetron (ZOFRAN) 8 MG tablet Take two times a day starting the day after chemo for 3 days. Then take two times a day as needed for nausea  or vomiting. 03/14/17  Yes [name], MD  oxandrolone (OXANDRIN) 10 MG tablet Take 1 tablet (10 mg total) by mouth daily after breakfast. 03/22/17  Yes Ennever, Rudell Cobb, MD  rivaroxaban (XARELTO) 10 MG TABS tablet Take 1 tablet (10 mg total) by mouth daily with supper. 03/03/17  Yes [name], MD  zolpidem (AMBIEN) 5 MG tablet Take 5 mg by mouth at bedtime as needed for sleep.  01/24/13  Yes [provider]  LORazepam (ATIVAN) 0.5 MG tablet Take 1 tablet (0.5 mg total) by mouth every 6 (six) hours as needed (Nausea or vomiting). Patient not taking: Reported on 03/24/2017 03/14/17   [name], MD  prochlorperazine (COMPAZINE) 10 MG tablet Take 1 tablet (10 mg total) by mouth every 6 (six) hours as needed (Nausea or vomiting). Patient not taking: Reported on 03/24/2017 03/14/17   [name], MD  prochlorperazine (COMPAZINE) 25 MG suppository Place 1 suppository (25 mg total) rectally every 12 (twelve) hours as needed for nausea. Patient not taking: Reported on 03/24/2017 03/14/17   [name], MD   Allergies  Allergen Reactions  . Promethazine Other (See Comments)    Makes hyper   Review of Systems  Constitutional: Positive for activity change and fatigue.  Respiratory: Positive for shortness of breath.   Psychiatric/Behavioral: Positive for sleep disturbance.   Physical Exam  General: Alert, awake, in no acute distress.  HEENT: No bruits, no goiter, no JVD Heart: Regular rate and rhythm. No murmur appreciated. Lungs: Good air  movement, Rhonchi predominantly in bases Abdomen: Soft, nontender, nondistended, positive bowel sounds.  Ext: No significant edema Skin: Warm and dry Neuro: Grossly intact, nonfocal.   Vital Signs: BP 122/73   Pulse 73   Temp 98.1 F (36.7 C) (Oral)   Resp 14   Ht 5' 7.5" (1.715 m)   Wt 53.5 kg (118 lb)   SpO2 99%   BMI 18.21 kg/m  Pain Assessment: No/denies pain   Pain Score: Asleep   SpO2: SpO2: 99 % O2 Device:SpO2: 99 % O2 Flow Rate: .   IO: Intake/output summary:   Intake/Output Summary (Last 24 hours) at 03/26/2017 1738 Last data filed at 03/26/2017 1300 Gross per 24 hour  Intake 830 ml  Output 875 ml  Net -45 ml    LBM:   Baseline Weight: Weight: 53.5 kg (118 lb) Most recent weight: Weight: 53.5 kg (118 lb)     Palliative Assessment/Data:     Time In: 1100 Time Out: 1215 Time Total: 75 Greater than 50%  of this time was spent counseling and coordinating care related to the above assessment and plan.  Signed by: Micheline Rough, MD   Please contact Palliative Medicine Team phone at [phone] for questions and concerns.  For individual provider: See Shea Evans

## 2017-03-26 NOTE — Progress Notes (Signed)
Hypoglycemic Event  CBG: 69 at 0807H  Treatment: 4 oz apple juice  Symptoms: None  Follow-up CBG: Time: 0835H  CBG Result:66  Follow-up treatment: dextrose 40% oral gel  Follow-up CBG: Time: 0901H  CBG Result: 97  Possible Reasons for Event: Decrease PO intake.

## 2017-03-26 NOTE — Progress Notes (Signed)
Nutrition Brief Note  Pt identified as at nutrition risk on the Malnutrition Screen Tool.  Chart reviewed. Pt now transitioning to comfort care per oncology note on 2/16. No nutrition interventions warranted at this time.   Clayton Bibles, MS, RD, [place] Dietitian Pager: [phone] After Hours Pager: [phone]

## 2017-03-27 DIAGNOSIS — D72819 Decreased white blood cell count, unspecified: Secondary | ICD-10-CM

## 2017-03-27 DIAGNOSIS — C3492 Malignant neoplasm of unspecified part of left bronchus or lung: Secondary | ICD-10-CM

## 2017-03-27 DIAGNOSIS — A021 Salmonella sepsis: Secondary | ICD-10-CM

## 2017-03-27 DIAGNOSIS — E1165 Type 2 diabetes mellitus with hyperglycemia: Secondary | ICD-10-CM

## 2017-03-27 DIAGNOSIS — J188 Other pneumonia, unspecified organism: Secondary | ICD-10-CM

## 2017-03-27 LAB — BASIC METABOLIC PANEL
Anion gap: 7 (ref 5–15)
BUN: 10 mg/dL (ref 6–20)
CALCIUM: 8 mg/dL — AB (ref 8.9–10.3)
CO2: 30 mmol/L (ref 22–32)
Chloride: 100 mmol/L — ABNORMAL LOW (ref 101–111)
Creatinine, Ser: 0.86 mg/dL (ref 0.61–1.24)
Glucose, Bld: 79 mg/dL (ref 65–99)
POTASSIUM: 2.7 mmol/L — AB (ref 3.5–5.1)
SODIUM: 137 mmol/L (ref 135–145)

## 2017-03-27 LAB — CBC
HCT: 33.8 % — ABNORMAL LOW (ref 39.0–52.0)
Hemoglobin: 11.3 g/dL — ABNORMAL LOW (ref 13.0–17.0)
MCH: 31 pg (ref 26.0–34.0)
MCHC: 33.4 g/dL (ref 30.0–36.0)
MCV: 92.6 fL (ref 78.0–100.0)
PLATELETS: 30 10*3/uL — AB (ref 150–400)
RBC: 3.65 MIL/uL — AB (ref 4.22–5.81)
RDW: 13.7 % (ref 11.5–15.5)
WBC: 2.8 10*3/uL — ABNORMAL LOW (ref 4.0–10.5)

## 2017-03-27 LAB — GLUCOSE, CAPILLARY
GLUCOSE-CAPILLARY: 138 mg/dL — AB (ref 65–99)
GLUCOSE-CAPILLARY: 250 mg/dL — AB (ref 65–99)
GLUCOSE-CAPILLARY: 159 mg/dL — AB (ref 65–99)
Glucose-Capillary: 124 mg/dL — ABNORMAL HIGH (ref 65–99)

## 2017-03-27 LAB — MAGNESIUM: Magnesium: 1.5 mg/dL — ABNORMAL LOW (ref 1.7–2.4)

## 2017-03-27 LAB — HCV AB W REFLEX TO QUANT PCR: HCV Ab: <0.1 s/co ratio (ref 0.0–0.9)

## 2017-03-27 LAB — HCV INTERPRETATION

## 2017-03-27 LAB — HEPATITIS B SURFACE ANTIGEN: HEP B S AG: NEGATIVE

## 2017-03-27 MED ORDER — POTASSIUM CHLORIDE 10 MEQ/100ML IV SOLN
10.0000 meq | INTRAVENOUS | Status: DC
  Administered 2017-03-27 (×4): 10 meq via INTRAVENOUS
  Filled 2017-03-27 (×3): qty 100

## 2017-03-27 MED ORDER — OXYCODONE HCL 5 MG PO TABS
5.0000 mg | ORAL_TABLET | ORAL | Status: DC | PRN
  Administered 2017-03-27: 5 mg via ORAL
  Filled 2017-03-27: qty 1

## 2017-03-27 MED ORDER — MAGNESIUM SULFATE 2 GM/50ML IV SOLN
2.0000 g | Freq: Once | INTRAVENOUS | Status: AC
  Administered 2017-03-28: 2 g via INTRAVENOUS
  Filled 2017-03-27: qty 50

## 2017-03-27 MED ORDER — POTASSIUM CHLORIDE CRYS ER 20 MEQ PO TBCR
40.0000 meq | EXTENDED_RELEASE_TABLET | Freq: Two times a day (BID) | ORAL | Status: AC
  Administered 2017-03-27 (×2): 40 meq via ORAL
  Filled 2017-03-27 (×2): qty 2

## 2017-03-27 NOTE — Progress Notes (Signed)
I very much appreciate everybody's help.  I especially appreciate Dr. Geralyn Flash input.  I totally agree with everybody's assessment.  We gave him dose reduced treatment for his metastatic lung cancer.  He has no actionable mutations that we can target.  From "day 1" I have put quality of life as the top priority.  [name] has always been in agreement of this.  He really took chemotherapy just to try to please his wife.  His daughter is with him today.  She is in agreement with no further chemotherapy.  I know that he has been somewhat "lax" with his diabetes at home.  His blood sugars have been quite high.  Looks like he may have a pneumonia in the left lower lung.  He does have some leukopenia.  This is improving.  It is all about weight loss from my perspective.  He lost quite a bit of weight before we can start him on treatment.  He actually feels pretty good right now.  He is incredibly impressed with the care that he is gotten from the staff in the ICU.  He really does not does not want to leave the ICU.  I am unsure if is really gotten out of bed.  He is not complained of any pain.  He does not have any shortness of breath.  There is no bleeding.  He does have the atrial fibrillation.  Again, as an outpatient, he really was not that worried about the atrial fibrillation.  On his physical exam, his temperature is 98.2.  Pulse 91.  Blood pressure 119/81.  Oxygen saturation is 98%.  His lungs sound pretty good.  Oral exam does not show any mucositis.  There is no thrush.  His cardiac exam is tachycardic and irregular consistent with the atrial fibrillation.  Abdomen is soft.  [name] has metastatic adenocarcinoma of the lung.  This came back after 6 years.  He had a stage I tumor.  Again, there is no actionable mutations that we can target with an oral drug.  Clearly, our goal now is comfort.  Quality of life is what we really need.  Is hard to say what his prognosis is.   I told him from the start that his prognosis will be based on his weight.  If he loses more weight, then I think his prognosis probably will be about a month or so.  If we can get him to gain weight, then I think he will do better.  Hopefully, just the 1 cycle of treatment was enough to try to slow his malignancy down.  May be, he will be moved out of the ICU soon.  I think his diet can clearly be much more liberal.  I told his daughter to bring in whatever food he would like.  To me, I really do not mind if his blood sugar is in the 200 range.

## 2017-03-27 NOTE — Progress Notes (Signed)
Daily Progress Note   Patient Name: [name]       Date: 03/27/2017 DOB: [birth year]  Age: 84 y.o. MRN#: [id number] Attending Physician: Theodis Blaze, MD Primary Care Physician: Lavone Orn, MD Admit Date: 03/24/2017  Reason for Consultation/Follow-up: Establishing goals of care, Non pain symptom management, Pain control and Psychosocial/spiritual support  Subjective: Patient's chart reviewed and discussed with bedside RN.    Patient sitting up in bed enjoying time with his family (wife and daughter) being at bedside.  He currently denies any pain or discomfort. States "he feels better today." Verbalizes that he spoke with Dr. Marin Olp this morning and the plan is not to proceed with any further chemotherapy/immunotherapy treatments at this time but to focus on weight gain and nutrition. Wife and daughter verbalized understanding as well. When discussing were does his care go from here he states " I don't want to talk about those discussions. We will just let whatever happens, happen when it happens." Family is concerned about him lying in bed over the past few days and becoming more deconditioned as well as him receiving IV morphine. He states his goal for therapy would be "to make it to the bathroom on his own and back."   Length of Stay: 3  Current Medications: Scheduled Meds:  . diltiazem  240 mg Oral Daily  . insulin aspart  0-9 Units Subcutaneous TID WC  . insulin glargine  10 Units Subcutaneous QHS  . levothyroxine  100 mcg Oral QAC breakfast  . potassium chloride  40 mEq Oral BID    Continuous Infusions: . ceFEPime (MAXIPIME) IV Stopped (03/27/17 0709)  . vancomycin     Followed by  . vancomycin 750 mg (03/26/17 2100)    PRN Meds: ipratropium-albuterol, metoprolol  tartrate, morphine injection, ondansetron (ZOFRAN) IV  Physical Exam  Constitutional: He is oriented to person, place, and time. He is cooperative. He appears ill.  Pulmonary/Chest: Effort normal.  Musculoskeletal:  Generalized weakness   Neurological: He is alert and oriented to person, place, and time.             Vital Signs: BP (!) 135/102 (BP Location: Right Arm)   Pulse (!) 137   Temp 98.3 F (36.8 C) (Oral)   Resp 20   Ht 5' 7.5" (1.715 m)   Wt  53.5 kg (118 lb)   SpO2 99%   BMI 18.21 kg/m  SpO2: SpO2: 99 % O2 Device: O2 Device: Not Delivered O2 Flow Rate:    Intake/output summary:   Intake/Output Summary (Last 24 hours) at 03/27/2017 1642 Last data filed at 03/27/2017 1141 Gross per 24 hour  Intake 850 ml  Output 525 ml  Net 325 ml   LBM: Last BM Date: (PTA) Baseline Weight: Weight: 53.5 kg (118 lb) Most recent weight: Weight: 53.5 kg (118 lb)       Palliative Assessment/Data: PPS 40%    Flowsheet Rows     Most Recent Value  Intake Tab  Referral Department  Hospitalist  Unit at Time of Referral  Med/Surg Unit  Palliative Care Primary Diagnosis  Cancer  Date Notified  03/25/17  Palliative Care Type  New Palliative care  Reason for referral  Counsel Regarding Hospice  Date of Admission  03/24/17  Date first seen by Palliative Care  03/26/17  # of days Palliative referral response time  1 Day(s)  # of days IP prior to Palliative referral  1  Clinical Assessment  Psychosocial & Spiritual Assessment  Palliative Care Outcomes      Patient Active Problem List   Diagnosis Date Noted  . Sepsis ([place]) 03/24/2017  . Metastatic lung carcinoma, left ([place]) 03/03/2017  . Adenocarcinoma of lung, stage 4, right ([place]) 03/03/2017  . Counseling regarding goals of care 03/03/2017  . Unintentional weight loss 12/13/2016  . Wheezing on auscultation 11/02/2015  . Mitral valve insufficiency 11/02/2015  . Murmur 09/24/2015  . Dizziness 09/24/2015  . Lung cancer,  lower lobe ([place]) 01/27/2013  . 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 Iron deficiency anemia due to chronic blood loss 01/27/2013  . Atrial fibrillation ([place]) 05/26/2012  . Long term current use of anticoagulant therapy 05/26/2012    Palliative Care Assessment & Plan   Patient Profile: 84 y.o. male  with past medical history of attention, hypothyroidism, stage IV lung cancer, prior pancreatic cancer admitted on 03/24/2017 with A. fib with RVR and respiratory failure likely secondary to pneumonia. Imaging studies confirmed PNA. He agreed to antibiotics and rate control but did not want further aggressive interventions.   Assessment:  A-fib RVR  Acute respiratory failure with hypoxia   Hypokalemia   FTT  Thrombocytopenia   Stage IV lung cancer  Hx of pancreatic cancer   Pneumonia   Recommendations/Plan:  DNR/DI  Will d/c IV morphine (PMT will evaluate over next 24 hrs and if no pain relief with oral medication patient and family aware that morphine can be reordered. At this time he feels he would like to be on oral medication and something not quite as strong. 2mg  IV morphine received over the past 24 hrs.)  Oxycodone 5mg  PO PRN for pain control (as he states this is what has worked for him in the past)  PT consult to evaluate need for outpatient rehab (patient and family concerned about him deconditioning despite not having chemo treatments. Patient's goal is to be able to walk to the bathroom)  Recommend continuation of current interventions and evaluation by Dr. Marin Olp.  Seems family is not certain were they are to go from here as far as health decline or condition in future since he will not be restarting treatment.   Palliative team will continue to follow with providers and support patient and family during this hospitalization.   Goals of Care and Additional Recommendations:  Limitations on Scope  of Treatment: Full Scope Treatment  Code Status:    Code  Status Orders  (From admission, onward)        Start     Ordered   03/24/17 1753  Do not attempt resuscitation (DNR)  Continuous    Question Answer Comment  In the event of cardiac or respiratory ARREST Do not call a "code blue"   In the event of cardiac or respiratory ARREST Do not perform Intubation, CPR, defibrillation or ACLS   In the event of cardiac or respiratory ARREST Use medication by any route, position, wound care, and other measures to relive pain and suffering. May use oxygen, suction and manual treatment of airway obstruction as needed for comfort.      03/24/17 1755    Code Status History    Date Active Date Inactive Code Status Order ID Comments User Context   03/24/2017 15:38 03/24/2017 17:55 DNR [id number]  Drenda Freeze, MD ED    Advance Directive Documentation     Most Recent Value  Type of Advance Directive  Healthcare Power of Attorney  Pre-existing out of facility DNR order (yellow form or pink MOST form)  No data  "MOST" Form in Place?  No data       Prognosis:   Unable to determine-Anticipate poor prognosis in future given malignancy and discontinuation of further treatments.   Discharge Planning:  To Be Determined  Care plan was discussed with patient, family, and bedside RN.   Thank you for allowing the Palliative Medicine Team to assist in the care of this patient.   Total Time 45 min.  Prolonged Time Billed  No      Greater than 50%  of this time was spent counseling and coordinating care related to the above assessment and plan.  Alda Lea, AGNP-C Palliative Medicine Team  Phone: [phone] Fax: [phone]  Please contact Palliative Medicine Team phone at [phone] for questions and concerns.

## 2017-03-27 NOTE — Progress Notes (Signed)
Patient ID: [name], male   DOB: May 06, 1933, 84 y.o.   MRN: [id number]    PROGRESS NOTE  SACHIN FERENCZ  IWL:[id number] DOB: 1933/03/17 DOA: 03/24/2017  PCP: Lavone Orn, MD   Brief Narrative:  Pt is 84 yo male with HTN, hypothyroidism, stage IV lung cancer, pancreatic ca, recent chemo 9 days PTA, presented with abd pain, nausea and cough, generalized weakness. Pt reports associated dyspnea with exertion and difficulty performing ADL. In ED, pt has refused further imaging studies, asked to focus on comfort and confirmed DNR status. Imaging studies confirmed PNA and pt was started on ABX. Pt was OK with abx regimen and asked for palliative care consultation.   Assessment & Plan:   A fib RVR.  - HR still in 120-140's but pt reports no chest pain and no dyspnea  - continue home medical regimen with Diltiazem - no AC due to thrombocytopenia  - monitor on tele   Acute res[piratory failure with hypoxia  - in pt with known stage IV lung cancer, possible post obstructive pneumonia - has been on vanc and cefepime and is clinically improving - keep on oxygen via [place] - oxygen saturations are stable this AM  Hypokalemia - still low, will continue to supplement, will also check Mg level - BMP In AM  FTT, severe PCM - liberalize diet  Thrombocytopenia - chemotherapy induced - improving  - repeat CBC in AM   DVT prophylaxis: SCD's Code Status: DNR Family Communication: pt and daughter at bedside  Disposition Plan: to be determined, can move out of SDU if HR better controlled   Consultants:   PCT  Procedures:   None  Antimicrobials:   Vancomycin 02/15 -->  Cefepime 02/15 -->  Subjective: Pt reports feeling better.   Objective: Vitals:   03/27/17 0600 03/27/17 0700 03/27/17 0800 03/27/17 0802  BP:    (!) 138/94  Pulse: 99 (!) 123 (!) 127 (!) 132  Resp: 15 13 11 14   Temp:   (!) 97.1 F (36.2 C)   TempSrc:   Oral   SpO2: 97% 99% 98% 99%  Weight:        Height:        Intake/Output Summary (Last 24 hours) at 03/27/2017 1213 Last data filed at 03/27/2017 0800 Gross per 24 hour  Intake 690 ml  Output 750 ml  Net -60 ml   Filed Weights   03/24/17 1334  Weight: 53.5 kg (118 lb)   Physical Exam  Constitutional: Appears well-developed and well-nourished. No distress.  CVS: IRRR, no gallops, no carotid bruit.  Pulmonary: Effort and breath sounds normal, rhonchi at bases still noted  Abdominal: Soft. BS +,  no distension, tenderness, rebound or guarding.  Musculoskeletal: Normal range of motion. No edema and no tenderness.  Neuro: Alert. Normal reflexes, muscle tone coordination. No cranial nerve deficit. Psychiatric: Normal mood and affect. Behavior, judgment, thought content normal.   Data Reviewed: I have personally reviewed following labs and imaging studies  CBC: Recent Labs  Lab 03/24/17 1418 03/25/17 0538 03/27/17 0519  WBC 1.5* 2.0* 2.8*  NEUTROABS 1.0*  --   --   HGB 13.9 11.9* 11.3*  HCT 40.0 35.2* 33.8*  MCV 91.3 92.9 92.6  PLT 28* 22* 30*   Basic Metabolic Panel: Recent Labs  Lab 03/24/17 1418 03/25/17 0538 03/27/17 0519  NA 138 139 137  K 3.8 3.4* 2.7*  CL 98* 103 100*  CO2 27 27 30   GLUCOSE 352* 267* 79  BUN  23* 17 10  CREATININE 0.98 0.85 0.86  CALCIUM 9.1 8.2* 8.0*   Liver Function Tests: Recent Labs  Lab 03/24/17 1418  AST 16  ALT 25  ALKPHOS 95  BILITOT 0.9  PROT 6.4*  ALBUMIN 3.5   Recent Labs  Lab 03/24/17 1418  LIPASE 16   CBG: Recent Labs  Lab 03/26/17 0835 03/26/17 0901 03/26/17 1159 03/26/17 1605 03/26/17 2108  GLUCAP 66 97 155* 74 142*   Urine analysis:    Component Value Date/Time   COLORURINE YELLOW 03/24/2017 1820   APPEARANCEUR CLEAR 03/24/2017 1820   LABSPEC 1.017 03/24/2017 1820   PHURINE 5.0 03/24/2017 1820   GLUCOSEU >=500 (A) 03/24/2017 1820   HGBUR SMALL (A) 03/24/2017 1820   BILIRUBINUR NEGATIVE 03/24/2017 1820   KETONESUR 20 (A) 03/24/2017 1820    PROTEINUR 30 (A) 03/24/2017 1820   UROBILINOGEN 0.2 03/03/2008 1022   NITRITE NEGATIVE 03/24/2017 1820   LEUKOCYTESUR NEGATIVE 03/24/2017 1820   Recent Results (from the past 240 hour(s))  Blood culture (routine x 2)     Status: None (Preliminary result)   Collection Time: 03/24/17  8:40 PM  Result Value Ref Range Status   Specimen Description   Final    BLOOD RIGHT ARM Performed at [facility], [place] [address] [place], [place] [address]    Special Requests   Final    BOTTLES DRAWN AEROBIC AND ANAEROBIC Blood Culture adequate volume Performed at [facility] [address], [place], [place] [address]    Culture   Final    NO GROWTH 2 DAYS Performed at [facility] [address] [place], [place] [address]    Report Status PENDING  Incomplete  Blood culture (routine x 2)     Status: None (Preliminary result)   Collection Time: 03/24/17  8:57 PM  Result Value Ref Range Status   Specimen Description   Final    BLOOD LEFT HAND Performed at [facility] [address], [place], [place] [address]    Special Requests   Final    BOTTLES DRAWN AEROBIC ONLY Blood Culture adequate volume Performed at [facility] [address], [place], [place] [address]    Culture   Final    NO GROWTH 2 DAYS Performed at [facility] [address], [place], [place] [address]    Report Status PENDING  Incomplete  MRSA PCR Screening     Status: None   Collection Time: 03/24/17 10:48 PM  Result Value Ref Range Status   MRSA by PCR NEGATIVE NEGATIVE Final    Comment:        The GeneXpert MRSA Assay (FDA approved for NASAL specimens only), is one component of a comprehensive MRSA colonization surveillance program. It is not intended to diagnose MRSA infection nor to guide or monitor treatment for MRSA infections. Performed at [facility], [place] [address] [place],   [address]     Radiology Studies: No results found.  Scheduled Meds: . diltiazem  240 mg Oral Daily  . insulin aspart  0-9 Units Subcutaneous TID WC  . insulin glargine  10 Units Subcutaneous QHS  . levothyroxine  100 mcg Oral QAC breakfast  . potassium chloride  40 mEq Oral BID   Continuous Infusions: . ceFEPime (MAXIPIME) IV Stopped (03/27/17 0709)  . potassium chloride Stopped (03/27/17 1259)  . vancomycin     Followed by  . vancomycin 750 mg (03/26/17 2100)  LOS: 3 days   Time spent: 25 minutes   Faye Ramsay, MD [facility] Pager [phone]  If 7PM-7AM, please contact night-coverage [email or web address] Password [facility] 03/27/2017, 12:13 PM

## 2017-03-27 NOTE — Progress Notes (Signed)
Hospitalist APP notified of critical K level

## 2017-03-28 ENCOUNTER — Inpatient Hospital Stay (HOSPITAL_COMMUNITY): Payer: Medicare Other

## 2017-03-28 LAB — GLUCOSE, CAPILLARY
GLUCOSE-CAPILLARY: 313 mg/dL — AB (ref 65–99)
GLUCOSE-CAPILLARY: 187 mg/dL — AB (ref 65–99)
GLUCOSE-CAPILLARY: 99 mg/dL (ref 65–99)
Glucose-Capillary: 140 mg/dL — ABNORMAL HIGH (ref 65–99)
Glucose-Capillary: 140 mg/dL — ABNORMAL HIGH (ref 65–99)

## 2017-03-28 LAB — BASIC METABOLIC PANEL
ANION GAP: 8 (ref 5–15)
BUN: 13 mg/dL (ref 6–20)
CHLORIDE: 102 mmol/L (ref 101–111)
CO2: 27 mmol/L (ref 22–32)
Calcium: 7.9 mg/dL — ABNORMAL LOW (ref 8.9–10.3)
Creatinine, Ser: 1.09 mg/dL (ref 0.61–1.24)
Glucose, Bld: 222 mg/dL — ABNORMAL HIGH (ref 65–99)
POTASSIUM: 4.2 mmol/L (ref 3.5–5.1)
SODIUM: 137 mmol/L (ref 135–145)

## 2017-03-28 LAB — CBC
HCT: 30.2 % — ABNORMAL LOW (ref 39.0–52.0)
HEMOGLOBIN: 10.4 g/dL — AB (ref 13.0–17.0)
MCH: 32.2 pg (ref 26.0–34.0)
MCHC: 34.4 g/dL (ref 30.0–36.0)
MCV: 93.5 fL (ref 78.0–100.0)
PLATELETS: 41 10*3/uL — AB (ref 150–400)
RBC: 3.23 MIL/uL — AB (ref 4.22–5.81)
RDW: 14 % (ref 11.5–15.5)
WBC: 3.6 10*3/uL — AB (ref 4.0–10.5)

## 2017-03-28 LAB — MAGNESIUM: MAGNESIUM: 1.6 mg/dL — AB (ref 1.7–2.4)

## 2017-03-28 MED ORDER — AMOXICILLIN-POT CLAVULANATE 875-125 MG PO TABS
1.0000 | ORAL_TABLET | Freq: Two times a day (BID) | ORAL | Status: DC
  Administered 2017-03-28 – 2017-04-02 (×11): 1 via ORAL
  Filled 2017-03-28 (×11): qty 1

## 2017-03-28 MED ORDER — DILTIAZEM HCL 60 MG PO TABS
60.0000 mg | ORAL_TABLET | Freq: Every day | ORAL | Status: DC
  Administered 2017-03-28: 60 mg via ORAL
  Filled 2017-03-28: qty 1

## 2017-03-28 MED ORDER — MORPHINE SULFATE (CONCENTRATE) 10 MG/0.5ML PO SOLN
5.0000 mg | ORAL | Status: DC | PRN
  Administered 2017-03-28 – 2017-03-31 (×10): 5 mg via ORAL
  Administered 2017-03-31: 0.5 mg via ORAL
  Administered 2017-04-01 (×2): 5 mg via ORAL
  Filled 2017-03-28 (×15): qty 0.5

## 2017-03-28 NOTE — Progress Notes (Signed)
[name] is doing a little better.  He sounds better.  He really enjoys having the Foley catheter in place.  I do think that some physical therapy will be very beneficial.  I will see about physical therapy seeing him.  We talked about pain medication.  They prefer morphine.  It seems to help him rest better.  I will try him on some Roxanol.  I think his atrial fibrillation is a real problem.  I really think that if he is to get better functionally, then the atrial fibrillation needs to be under better control.  This morning, his rate was typically in the 120-130 range.  I do not think he would do well with this physically at home.  Of note, his TSH was 3.2 last week.  He does seem to be eating a little bit better.  His labs are improving.  His white cell count is now up to 3.6.  His platelet count is 41,000.  His blood sugars are on the higher side.  Again, I do not think this really is a major issue as his caloric intake is really going to be essential if he is to have a decent quality of life.  His potassium is doing much better.  It is 4.2 this morning.  On his physical exam, his temperature is 97.6.  Pulse is 126.  Blood pressure 144/94.  His lungs sound some decreased at the bases.  Cardiac exam is tachycardic and irregular.  Abdomen is soft.  Bowel sounds are present.  I do think that it be worthwhile getting a chest x-ray on him.  It would be nice to see how this looks.  Again, from my perspective, I think his quality of life will improve very nicely if his atrial fibrillation gets under better control.  I am not sure how this can happen.  I does think that his heart rate needs to be in the 80-90 range for him to be able to function better.  I do appreciate all the wonderful care that he is getting.  He and his granddaughter are very impressed with the wonderful care that he is getting.  Lattie Haw, MD  1 John 1:7

## 2017-03-28 NOTE — Evaluation (Signed)
Physical Therapy Evaluation Patient Details Name: [name] MRN: [id number] DOB: [birth year] Today's Date: 03/28/2017   History of Present Illness   84 y.o. male with PMH of HTN, Hypothyroidism, Stage Iv lung cancer (mets pleural/bone), h/o pancreatic ca, recent chemotherapy 9 days ago presented with worsening abdominal pains, nausea, cough and generalized weakness. Yesterday, he tried to get out of bed but unable to stand then fell on the ground striking his head. He reports progressive weakness, he is unable to do his ADL. He reports cough, dyspnea, pleur tic chest pain  Clinical Impression  Pt admitted with above diagnosis. Pt currently with functional limitations due to the deficits listed below (see PT Problem List). Mod assist for bed mobility and transfers, pt ambulated 5' with RW, distance limited by therapist 2* HR up to 137 while ambulating. HR 115-120 at rest.  Pt will benefit from skilled PT to increase their independence and safety with mobility to allow discharge to the venue listed below.       Follow Up Recommendations SNF;Supervision for mobility/OOB    Equipment Recommendations  Rolling walker with 5" wheels    Recommendations for Other Services       Precautions / Restrictions Precautions Precautions: Fall Precaution Comments: monitor HR, reports 2 falls at a park last summer Restrictions Weight Bearing Restrictions: No      Mobility  Bed Mobility Overal bed mobility: Needs Assistance Bed Mobility: Supine to Sit     Supine to sit: Mod assist     General bed mobility comments: assist to raise trunk and pivot to edge of bed  Transfers Overall transfer level: Needs assistance Equipment used: Rolling walker (2 wheeled) Transfers: Sit to/from Stand Sit to Stand: From elevated surface;Mod assist         General transfer comment: VCs for hand placement, mod A to rise  Ambulation/Gait Ambulation/Gait assistance: Min assist Ambulation Distance  (Feet): 5 Feet Assistive device: Rolling walker (2 wheeled) Gait Pattern/deviations: Step-to pattern;Decreased stride length;Trunk flexed   Gait velocity interpretation: Below normal speed for age/gender General Gait Details: ambulation distance limited by therapist 2* HR up to 137 with activity, HR 120 at rest   Stairs            Wheelchair Mobility    Modified Rankin (Stroke Patients Only)       Balance Overall balance assessment: History of Falls;Needs assistance   Sitting balance-Leahy Scale: Good     Standing balance support: Bilateral upper extremity supported Standing balance-Leahy Scale: Poor Standing balance comment: relies on BUE support                             Pertinent Vitals/Pain Pain Assessment: No/denies pain    Home Living Family/patient expects to be discharged to:: Private residence Living Arrangements: Spouse/significant other Available Help at Discharge: Family;Available 24 hours/day Type of Home: House Home Access: Stairs to enter Entrance Stairs-Rails: Right Entrance Stairs-Number of Steps: 3 Home Layout: Two level;Able to live on main level with bedroom/bathroom Home Equipment: None      Prior Function Level of Independence: Independent               Hand Dominance        Extremity/Trunk Assessment   Upper Extremity Assessment Upper Extremity Assessment: Overall WFL for tasks assessed    Lower Extremity Assessment Lower Extremity Assessment: Overall WFL for tasks assessed    Cervical / Trunk Assessment Cervical / Trunk Assessment:  Kyphotic  Communication   Communication: No difficulties  Cognition Arousal/Alertness: Awake/alert Behavior During Therapy: WFL for tasks assessed/performed Overall Cognitive Status: Within Functional Limits for tasks assessed                                        General Comments      Exercises General Exercises - Lower Extremity Ankle Circles/Pumps:  AROM;Both;10 reps;Supine Quad Sets: AROM;Both;5 reps;Supine   Assessment/Plan    PT Assessment Patient needs continued PT services  PT Problem List Decreased activity tolerance;Decreased balance;Decreased mobility       PT Treatment Interventions Gait training;Therapeutic exercise;Therapeutic activities;DME instruction;Patient/family education    PT Goals (Current goals can be found in the Care Plan section)  Acute Rehab PT Goals Patient Stated Goal: get strong enough to go home PT Goal Formulation: With patient Time For Goal Achievement: 04/11/17 Potential to Achieve Goals: Good    Frequency Min 3X/week   Barriers to discharge        Co-evaluation               AM-PAC PT "6 Clicks" Daily Activity  Outcome Measure Difficulty turning over in bed (including adjusting bedclothes, sheets and blankets)?: A Lot Difficulty moving from lying on back to sitting on the side of the bed? : Unable Difficulty sitting down on and standing up from a chair with arms (e.g., wheelchair, bedside commode, etc,.)?: Unable Help needed moving to and from a bed to chair (including a wheelchair)?: A Lot Help needed walking in hospital room?: A Lot Help needed climbing 3-5 steps with a railing? : Total 6 Click Score: 9    End of Session Equipment Utilized During Treatment: Gait belt Activity Tolerance: Treatment limited secondary to medical complications (Comment)(HR 137 with ambulation) Patient left: in chair;with call bell/phone within reach Nurse Communication: Mobility status PT Visit Diagnosis: History of falling (Z91.81);Difficulty in walking, not elsewhere classified (R26.2)    Time: 1127-1201 PT Time Calculation (min) (ACUTE ONLY): 34 min   Charges:   PT Evaluation $PT Eval Low Complexity: 1 Low PT Treatments $Gait Training: 8-22 mins   PT G Codes:          Philomena Doheny 03/28/2017, 1:11 PM [phone]

## 2017-03-28 NOTE — Progress Notes (Signed)
Daily Progress Note   Patient Name: [name]       Date: 03/28/2017 DOB: May 17, 1933  Age: 84 y.o. MRN#: [id number] Attending Physician: George Blaze, MD Primary Care Physician: George Orn, MD Admit Date: 03/24/2017  Reason for Consultation/Follow-up: Establishing goals of care, Non pain symptom management, Pain control and Psychosocial/spiritual support  Subjective: Patient's chart reviewed and discussed with bedside RN.   Patient is in good spirits this morning. He is sitting up in bed watching tv with his daughter. States he "feels great today and ready to see PT!." Daughter at bedside and verbalized understanding that PT will come and evaluate and make recommendations as far as disposition is concerned. Both patient and daughter is concerned about him returning home right away due to wife's debilitating OCD and bipolar struggles. His daughter is having to return home to [place], but will be back in a week or two to follow up with them and provide support. [name] continues to verbalize his appreciation for all the great staff and care. Denies pain this morning but did report having an agitated night. Daughter states he was slightly disoriented and agitated pulling at lines. He was started on Oxy IR PO yesterday evening and both patient and daughter felt that morphine controlled pain better and did not cause agitation. Appreciate [name] starting him on Roxanol. He is tolerating well as he received first dose this am.   Patient and daughter verbalized understanding of no further chemotherapy treatment and continuing with DNR. Hoping HR will get more controlled and can go to facility for rehab to improve his quality of life for as long as he can.   Length of Stay: 4  Current  Medications: Scheduled Meds:  . diltiazem  240 mg Oral Daily  . insulin aspart  0-9 Units Subcutaneous TID WC  . insulin glargine  10 Units Subcutaneous QHS  . levothyroxine  100 mcg Oral QAC breakfast    Continuous Infusions: . ceFEPime (MAXIPIME) IV 1 g (03/28/17 5027)  . vancomycin     Followed by  . vancomycin Stopped (03/27/17 2200)    PRN Meds: ipratropium-albuterol, metoprolol tartrate, morphine CONCENTRATE, ondansetron (ZOFRAN) IV  Physical Exam   Constitutional: He is oriented to person, place, and time. He is cooperative. He appears ill.  Pulmonary/Chest: Effort normal.  Musculoskeletal:  Generalized weakness      Vital Signs: BP (!) 144/94 (BP Location: Right Arm)   Pulse (!) 119   Temp 97.9 F (36.6 C) (Oral)   Resp (!) 22   Ht 5' 7.5" (1.715 m)   Wt 53.5 kg (118 lb)   SpO2 99%   BMI 18.21 kg/m  SpO2: SpO2: 99 % O2 Device: O2 Device: Not Delivered O2 Flow Rate:    Intake/output summary:   Intake/Output Summary (Last 24 hours) at 03/28/2017 1012 Last data filed at 03/28/2017 0400 Gross per 24 hour  Intake 890 ml  Output 300 ml  Net 590 ml   LBM: Last BM Date: (PTA) Baseline Weight: Weight: 53.5 kg (118 lb) Most recent weight: Weight: 53.5 kg (118 lb)       Palliative Assessment/Data: PPS 40%    Flowsheet Rows     Most Recent Value  Intake Tab  Referral Department  Hospitalist  Unit at Time of Referral  Med/Surg Unit  Palliative Care Primary Diagnosis  Cancer  Date Notified  03/25/17  Palliative Care Type  New Palliative care  Reason for referral  Counsel Regarding Hospice  Date of Admission  03/24/17  Date first seen by Palliative Care  03/26/17  # of days Palliative referral response time  1 Day(s)  # of days IP prior to Palliative referral  1  Clinical Assessment  Psychosocial & Spiritual Assessment  Palliative Care Outcomes      Patient Active Problem List   Diagnosis Date Noted  . Sepsis ([name]) 03/24/2017  . Metastatic lung  carcinoma, left ([name]) 03/03/2017  . Adenocarcinoma of lung, stage 4, right ([name]) 03/03/2017  . Counseling regarding goals of care 03/03/2017  . Unintentional weight loss 12/13/2016  . Wheezing on auscultation 11/02/2015  . Mitral valve insufficiency 11/02/2015  . Murmur 09/24/2015  . Dizziness 09/24/2015  . Lung cancer, lower lobe ([name]) 01/27/2013  . 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 Iron deficiency anemia due to chronic blood loss 01/27/2013  . Atrial fibrillation ([name]) 05/26/2012  . Long term current use of anticoagulant therapy 05/26/2012    Palliative Care Assessment & Plan   Assessment:  A-fib RVR  Acute respiratory failure with hypoxia   Hypokalemia   FTT  Thrombocytopenia   Stage IV lung cancer  Hx of pancreatic cancer   Pneumonia    Recommendations/Plan:  DNR/DI  Roxanol 5mg  PO PRN for pain and shortness of breath (Per [name])  PT consult to evaluate need for outpatient rehab (patient and family concerned about him deconditioning. Patient's goal is to be able to walk to the bathroom and improve quality of life for as long as he can.) I think this is reasonable as this is important to him and his family.   CXR to evaluate pneumonia and stability of malignancy  (Per [name])  Palliative team will continue to follow with providers and support patient and family during this hospitalization  Goals of Care and Additional Recommendations:  Limitations on Scope of Treatment: Full Scope Treatment  Code Status:    Code Status Orders  (From admission, onward)        Start     Ordered   03/24/17 1753  Do not attempt resuscitation (DNR)  Continuous    Question Answer Comment  In the event of cardiac or respiratory ARREST Do not call a "code blue"   In the event of cardiac or respiratory ARREST Do not perform Intubation, CPR, defibrillation  or ACLS   In the event of cardiac or respiratory ARREST Use medication by any route, position,  wound care, and other measures to relive pain and suffering. May use oxygen, suction and manual treatment of airway obstruction as needed for comfort.      03/24/17 1755    Code Status History    Date Active Date Inactive Code Status Order ID Comments User Context   03/24/2017 15:38 03/24/2017 17:55 DNR [id number]  Drenda Freeze, MD ED    Advance Directive Documentation     Most Recent Value  Type of Advance Directive  Healthcare Power of Attorney  Pre-existing out of facility DNR order (yellow form or pink MOST form)  No data  "MOST" Form in Place?  No data       Prognosis:   Unable to determine-Anticipate poor prognosis in future given malignancy and discontinuation of further treatments.   Discharge Planning:  To Be Determined, pending PT evaluation. Would recommend Hospice services whether it be at SNF or in home.   Care plan was discussed with patient and family.   Thank you for allowing the Palliative Medicine Team to assist in the care of this patient.   Total Time 20 min. Prolonged Time Billed  No      Greater than 50%  of this time was spent counseling and coordinating care related to the above assessment and plan.  Alda Lea, AGNP-C Palliative Medicine Team  Phone: [phone] Fax: [phone]  Please contact Palliative Medicine Team phone at [phone] for questions and concerns.

## 2017-03-28 NOTE — Progress Notes (Addendum)
Patient ID: [name], male   DOB: 04/07/1933, 84 y.o.   MRN: [id number]    PROGRESS NOTE  [name]  XVQ:[id number] DOB: 1933-11-06 DOA: 03/24/2017  PCP: Lavone Orn, MD   Brief Narrative:  Pt is 84 yo male with HTN, hypothyroidism, stage IV lung cancer, pancreatic ca, recent chemo 9 days PTA, presented with abd pain, nausea and cough, generalized weakness. Pt reports associated dyspnea with exertion and difficulty performing ADL. In ED, pt has refused further imaging studies, asked to focus on comfort and confirmed DNR status. Imaging studies confirmed PNA and pt was started on ABX. Pt was OK with abx regimen and asked for palliative care consultation.   Assessment & Plan:   A fib RVR.  - HR still not controlled and per granddaughter pt has been more restless at night time - pt has been on home regimen with Cardizem 240 mg QD and says that he occasionally has to take extra dose of Cardizem 60 mg, will try this today to see if it helps - no AC due to thrombocytopenia  - if HR better controlled, suspect we can transfer to tele bed in next 24 hours   Acute res[piratory failure with hypoxia  - in pt with known stage IV lung cancer, possible post obstructive pneumonia - still with rhonchi on exam and per granddaughter pt gets worse at night time - pt has been on Vancomycin and Cefepime since admission, will change to oral Augmentin today  - oxygen saturation stable - again, if HR better controlled, can transfer to tele in next 24 hours   DM type II - continue Insulin lantus 10 U QHS  Hypokalemia - K is now WNL, Mg is still low - will supplement and repeat BMP, Mg in AM  FTT, severe PCM - still not great oral intake but overall improving   Thrombocytopenia - chemotherapy induced - overall improving    DVT prophylaxis: SCD's Code Status: DNR Family Communication: pt and granddaughter at bedside  Disposition Plan: transfer to tele if HR controlled, possible d/c  home vs SNF in 2-3 days   Consultants:   PCT  Procedures:   None  Antimicrobials:   Vancomycin 02/15 --> 02/19    Cefepime 02/15 --> 02/19   Augmentin 02/19 -->  Subjective: Pt reports intermittent dyspnea and cough, better this AM. Ongoing atrial fib with HR in 130's.  Objective: Vitals:   03/28/17 1400 03/28/17 1500 03/28/17 1535 03/28/17 1600  BP: (!) 126/51 (!) 101/52  (!) 96/53  Pulse: (!) 112 65  (!) 51  Resp: (!) 21 (!) 21  12  Temp:   97.7 F (36.5 C)   TempSrc:   Oral   SpO2: 99% 97%  99%  Weight:      Height:        Intake/Output Summary (Last 24 hours) at 03/28/2017 1641 Last data filed at 03/28/2017 0400 Gross per 24 hour  Intake 490 ml  Output 300 ml  Net 190 ml   Filed Weights   03/24/17 1334  Weight: 53.5 kg (118 lb)   Physical Exam  Constitutional: Appears calm, NAD CVS: IRRR, S1/S2 +, no murmurs, no gallops, no carotid bruit.  Pulmonary: Rhonchi at bases  Abdominal: Soft. BS +,  no distension, tenderness, rebound or guarding.  Musculoskeletal: Normal range of motion. No edema and no tenderness.  Neuro: Alert. Normal reflexes, muscle tone coordination. No cranial nerve deficit.  Data Reviewed: I have personally reviewed following labs and imaging  studies  CBC: Recent Labs  Lab 03/24/17 1418 03/25/17 0538 03/27/17 0519 03/28/17 0331  WBC 1.5* 2.0* 2.8* 3.6*  NEUTROABS 1.0*  --   --   --   HGB 13.9 11.9* 11.3* 10.4*  HCT 40.0 35.2* 33.8* 30.2*  MCV 91.3 92.9 92.6 93.5  PLT 28* 22* 30* 41*   Basic Metabolic Panel: Recent Labs  Lab 03/24/17 1418 03/25/17 0538 03/27/17 0519 03/28/17 0331  NA 138 139 137 137  K 3.8 3.4* 2.7* 4.2  CL 98* 103 100* 102  CO2 27 27 30 27   GLUCOSE 352* 267* 79 222*  BUN 23* 17 10 13   CREATININE 0.98 0.85 0.86 1.09  CALCIUM 9.1 8.2* 8.0* 7.9*  MG  --   --  1.5* 1.6*   Liver Function Tests: Recent Labs  Lab 03/24/17 1418  AST 16  ALT 25  ALKPHOS 95  BILITOT 0.9  PROT 6.4*  ALBUMIN 3.5    Recent Labs  Lab 03/24/17 1418  LIPASE 16   CBG: Recent Labs  Lab 03/27/17 1709 03/27/17 2122 03/28/17 0720 03/28/17 1126 03/28/17 1533  GLUCAP 124* 159* 140* 313* 187*   Urine analysis:    Component Value Date/Time   COLORURINE YELLOW 03/24/2017 [place] 03/24/2017 1820   LABSPEC 1.017 03/24/2017 1820   PHURINE 5.0 03/24/2017 1820   GLUCOSEU >=500 (A) 03/24/2017 1820   HGBUR SMALL (A) 03/24/2017 1820   BILIRUBINUR NEGATIVE 03/24/2017 1820   KETONESUR 20 (A) 03/24/2017 1820   PROTEINUR 30 (A) 03/24/2017 1820   UROBILINOGEN 0.2 03/03/2008 1022   NITRITE NEGATIVE 03/24/2017 1820   LEUKOCYTESUR NEGATIVE 03/24/2017 1820   Recent Results (from the past 240 hour(s))  Blood culture (routine x 2)     Status: None (Preliminary result)   Collection Time: 03/24/17  8:40 PM  Result Value Ref Range Status   Specimen Description   Final    BLOOD RIGHT ARM Performed at [facility] [address] [place], [place] [address]    Special Requests   Final    BOTTLES DRAWN AEROBIC AND ANAEROBIC Blood Culture adequate volume Performed at [facility] [address], [place], [place] [address]    Culture   Final    NO GROWTH 4 DAYS Performed at [facility], [place] [address], [place], [place] [address]    Report Status PENDING  Incomplete  Blood culture (routine x 2)     Status: None (Preliminary result)   Collection Time: 03/24/17  8:57 PM  Result Value Ref Range Status   Specimen Description   Final    BLOOD LEFT HAND Performed at [facility] [address], [place], [place] [address]    Special Requests   Final    BOTTLES DRAWN AEROBIC ONLY Blood Culture adequate volume Performed at [facility] [address], [place], [place] [address]    Culture   Final    NO GROWTH 4 DAYS Performed at [facility], [place] [address] [place],  [address]    Report Status  PENDING  Incomplete  MRSA PCR Screening     Status: None   Collection Time: 03/24/17 10:48 PM  Result Value Ref Range Status   MRSA by PCR NEGATIVE NEGATIVE Final    Comment:        The GeneXpert MRSA Assay (FDA approved for NASAL specimens only), is one component of a comprehensive MRSA colonization surveillance program. It is  not intended to diagnose MRSA infection nor to guide or monitor treatment for MRSA infections. Performed at [facility], [place] [address] [place], [place] [address]     Radiology Studies: Dg Chest Port 1 View  Result Date: 03/28/2017 CLINICAL DATA:  Follow-up pneumonia. EXAM: PORTABLE CHEST 1 VIEW COMPARISON:  03/24/2017 FINDINGS: Persistent airspace consolidation within the left lung base is again noted. Chronic consolidative changes and/or atelectasis is again noted and appears unchanged from the previous exam. The left upper lobe appears well aerated as does the right lung. IMPRESSION: 1. No change in aeration to the left lung base compared with prior exam. Electronically Signed   By: Kerby Moors M.D.   On: 03/28/2017 09:47    Scheduled Meds: . amoxicillin-clavulanate  1 tablet Oral Q12H  . diltiazem  240 mg Oral Daily  . insulin aspart  0-9 Units Subcutaneous TID WC  . insulin glargine  10 Units Subcutaneous QHS  . levothyroxine  100 mcg Oral QAC breakfast   Continuous Infusions:   LOS: 4 days   Time spent: 25 minutes   Faye Ramsay, MD [facility] Pager [phone]  If 7PM-7AM, please contact night-coverage [email or web address] Password [facility] 03/28/2017, 4:41 PM

## 2017-03-28 NOTE — Progress Notes (Signed)
Bladder scanned pt and only revealed 181cc. Pt did have a wet pad underneath him. Condom cath changed. Will continue to monitor urine output.

## 2017-03-28 NOTE — Progress Notes (Signed)
Pt diltiazem medications adjusted this afternoon. Pt HR down from 130s to 50s. BP 101/52 (69). Pt with no complaints. Alert and Oriented. MD paged to be made aware. No new orders at this time. Will continue to monitor.

## 2017-03-29 DIAGNOSIS — J189 Pneumonia, unspecified organism: Secondary | ICD-10-CM

## 2017-03-29 DIAGNOSIS — I4891 Unspecified atrial fibrillation: Secondary | ICD-10-CM

## 2017-03-29 DIAGNOSIS — D696 Thrombocytopenia, unspecified: Secondary | ICD-10-CM

## 2017-03-29 LAB — MAGNESIUM: Magnesium: 1.9 mg/dL (ref 1.7–2.4)

## 2017-03-29 LAB — CBC
HCT: 34.9 % — ABNORMAL LOW (ref 39.0–52.0)
Hemoglobin: 11.7 g/dL — ABNORMAL LOW (ref 13.0–17.0)
MCH: 31.4 pg (ref 26.0–34.0)
MCHC: 33.5 g/dL (ref 30.0–36.0)
MCV: 93.6 fL (ref 78.0–100.0)
Platelets: 68 10*3/uL — ABNORMAL LOW (ref 150–400)
RBC: 3.73 MIL/uL — ABNORMAL LOW (ref 4.22–5.81)
RDW: 14.2 % (ref 11.5–15.5)
WBC: 4.3 10*3/uL (ref 4.0–10.5)

## 2017-03-29 LAB — CULTURE, BLOOD (ROUTINE X 2)
Culture: NO GROWTH
Culture: NO GROWTH
SPECIAL REQUESTS: ADEQUATE
Special Requests: ADEQUATE

## 2017-03-29 LAB — GLUCOSE, CAPILLARY
GLUCOSE-CAPILLARY: 145 mg/dL — AB (ref 65–99)
Glucose-Capillary: 124 mg/dL — ABNORMAL HIGH (ref 65–99)
Glucose-Capillary: 313 mg/dL — ABNORMAL HIGH (ref 65–99)
Glucose-Capillary: 156 mg/dL — ABNORMAL HIGH (ref 65–99)

## 2017-03-29 LAB — BASIC METABOLIC PANEL
Anion gap: 8 (ref 5–15)
BUN: 15 mg/dL (ref 6–20)
CALCIUM: 8.4 mg/dL — AB (ref 8.9–10.3)
CO2: 29 mmol/L (ref 22–32)
CREATININE: 0.87 mg/dL (ref 0.61–1.24)
Chloride: 103 mmol/L (ref 101–111)
GFR calc Af Amer: >60 mL/min (ref >60)
GFR calc non Af Amer: >60 mL/min (ref >60)
GLUCOSE: 74 mg/dL (ref 65–99)
Potassium: 4 mmol/L (ref 3.5–5.1)
Sodium: 140 mmol/L (ref 135–145)

## 2017-03-29 MED ORDER — POLYETHYLENE GLYCOL 3350 17 G PO PACK: 17.0000 g | PACK | Freq: Every day | ORAL | Status: DC | PRN

## 2017-03-29 MED ORDER — HYDRALAZINE HCL 20 MG/ML IJ SOLN: 5.0000 mg | Freq: Once | INTRAMUSCULAR | Status: DC | PRN

## 2017-03-29 MED ORDER — SENNA 8.6 MG PO TABS
1.0000 | ORAL_TABLET | Freq: Every day | ORAL | Status: DC
  Administered 2017-03-29 – 2017-04-02 (×5): 8.6 mg via ORAL
  Filled 2017-03-29 (×5): qty 1

## 2017-03-29 NOTE — Clinical Social Work Note (Signed)
Clinical Social Work Assessment  Patient Details  Name: [name] MRN: [id number] Date of Birth: Aug 01, 1933  Date of referral:  03/29/17               Reason for consult:  Facility Placement                Permission sought to share information with:  Family Supports Permission granted to share information::  Yes, Verbal Permission Granted  Name::     daughter [name], wife Psychologist, counselling::     Relationship::     Contact Information:     Housing/Transportation Living arrangements for the past 2 months:  Single Family Home Source of Information:  Patient Patient Interpreter Needed:  None Criminal Activity/Legal Involvement Pertinent to Current Situation/Hospitalization:  No - Comment as needed Significant Relationships:  Adult Children, Friend, Spouse Lives with:  Spouse Do you feel safe going back to the place where you live?  Yes Need for family participation in patient care:  No (Coment)(pt is own decision maker but wants to have input from family)  Care giving concerns:  Pt lives at home with his wife. His daughter [name] lives nearby and is supportive.  Pt has been through treatment for cancer at [facility]- reports that he is "being told there is not much further treatment to offer, but I want to keep doing everything we can, but we'll keep talking about that. I understand what my oncologist is telling me." States he has discussed with palliative medicine team as well and agreed to palliative following post DC. At baseline states he is independent at home, became week just PTA. Currently needing max assist with ambulating and ADLs.    Social Worker assessment / plan:  CSW consulted to assess and assist with SNF placement. Met with pt at bedside- he was alert/oriented, welcoming of CSW involvement. Gathered hx and care needs above. He reports that he wants to go to rehab in order to get strong enough to go back home with his wife. He initially wanted OP PT but after CSW discussed  recommendation for SNF with him, he states he understands and agrees he wants to pursue SNF. Explained referral and insurance authorization request process. He was very understanding and adaptable, stating if insurance was not to authorize his SNF request, "We will work something else out together and with my family." Pt states he is not familiar with SNFs in the area and would want his family to visit them and help him narrow down preferences. No family at bedside currently, but CSW left voicemail for wife.  Obtained PASSR, completed FL2 and made referrals to area SNFs.  Plan: SNF with palliative at DC. Will follow up with pt's family for bed offers. Once facility selected, insurance auth can be initiated.  Employment status:  Retired Nurse, adult PT Recommendations:  [place] / Referral to community resources:  [place]  Patient/Family's Response to care:  Pt highly appreciative- states he "loves being here at [facility]. The food is good, they take good care of me, it's the best place I could want to be"  Patient/Family's Understanding of and Emotional Response to Diagnosis, Current Treatment, and Prognosis:  Pt demonstrates good understanding of his treatment and potential plans. Becomes a bit sad when explaining that he is processing being told he may not have any further curative treatment options for his cancer. Speaks about his family and wife affectionately.  Emotional Assessment Appearance:  Appears stated age Attitude/Demeanor/Rapport:  Engaged Affect (typically observed):  Pleasant, Accepting, Calm Orientation:  Oriented to Self, Oriented to Place, Oriented to  Time, Oriented to Situation Alcohol / Substance use:  Not Applicable Psych involvement (Current and /or in the community):  No (Comment)  Discharge Needs  Concerns to be addressed:  Care Coordination, Decision making concerns, Discharge Planning  Concerns Readmission within the last 30 days:  No Current discharge risk:  Dependent with Mobility Barriers to Discharge:  Continued Medical Work up   [facility], LCSW 03/29/2017, 1:48 PM [phone]

## 2017-03-29 NOTE — Progress Notes (Signed)
[name] continues to improve.  He looks better.  He is feeling better.  I think he may have started a little physical therapy yesterday.  He really has not yet gotten out of bed.  His atrial fibrillation seems to be under better control.  His white cell count and platelets continue to improve.  His white cell count is 4.3.  Platelet count 60,000.  He had a chest x-ray yesterday.  It still shows some consolidation in the left lung base.  Everything else looked okay.  I talked to him again about the future.  I told him that I just did not see that we could treat him again.  The only way that I would consider treating him again as if he made a tremendous improvement in his performance status.  He would have to walk into the office.  He would have to gain weight.  He would have to eat well.  I am really not sure he is able to do all this.  I am glad that he is feeling better.  I am sure a lot of this has to do with his blood counts improving.  He really needs to get a lot of physical therapy.  He is not even able to get out of bed yet and walk to the bathroom.  I try to let him know that further treatment if given right now, would only harm him and really would not benefit his malignancy.  Given his weakened state, chemotherapy, if given, would further weaken him and he would end up back in the hospital.  It would not be inconceivable if chemotherapy, even if this was dose reduced further, could kill him.  I think he understands what I was telling him.  I told him that if he did make the necessary improvements in his performance status, then we can readdress treatment.  I think the chance of [place] retreating him probably is going to be less than 10-15%.  He really is incredibly grateful for the wonderful care that he is getting from the staff in the ICU.  He really loves being in the ICU.  Lattie Haw, MD  [facility] [phone]

## 2017-03-29 NOTE — Progress Notes (Signed)
George Najjar, NP notified of elevated B/P. Currently 152/116, HR controlled Afib 60-75 on telemetry. Awaiting further orders, will continue to monitor.

## 2017-03-29 NOTE — NC FL2 (Signed)
[place] LEVEL OF CARE SCREENING TOOL     IDENTIFICATION  Patient Name: [name] Birthdate: Jun 08, 1933 Sex: male Admission Date (Current Location): 03/24/2017  [facility] and [place] Number:  Herbalist and Address:  [facility],  [place] [address] [place]      Provider Number: [id number]  Attending Physician Name and Address:  Bonnielee Haff, MD  Relative Name and Phone Number:       Current Level of Care: Hospital Recommended Level of Care: [place] Prior Approval Number:    Date Approved/Denied:   PASRR Number: [id number]  Discharge Plan: SNF    Current Diagnoses: Patient Active Problem List   Diagnosis Date Noted  . Sepsis ([place]) 03/24/2017  . Metastatic lung carcinoma, left ([place]) 03/03/2017  . Adenocarcinoma of lung, stage 4, right ([place]) 03/03/2017  . Counseling regarding goals of care 03/03/2017  . Unintentional weight loss 12/13/2016  . Wheezing on auscultation 11/02/2015  . Mitral valve insufficiency 11/02/2015  . Murmur 09/24/2015  . Dizziness 09/24/2015  . Lung cancer, lower lobe ([place]) 01/27/2013  . 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 Iron deficiency anemia due to chronic blood loss 01/27/2013  . Atrial fibrillation ([place]) 05/26/2012  . Long term current use of anticoagulant therapy 05/26/2012    Orientation RESPIRATION BLADDER Height & Weight     Self, Time, Situation, Place  Normal Continent, External catheter Weight: 118 lb (53.5 kg) Height:  5' 7.5" (171.5 cm)  BEHAVIORAL SYMPTOMS/MOOD NEUROLOGICAL BOWEL NUTRITION STATUS      Continent Diet(regular diet, thin fluid consistency)  AMBULATORY STATUS COMMUNICATION OF NEEDS Skin   Extensive Assist Verbally Normal                       Personal Care Assistance Level of Assistance  Bathing, Feeding, Dressing Bathing Assistance: Limited assistance Feeding assistance: Independent Dressing Assistance: Limited  assistance     Functional Limitations Info  Sight, Hearing, Speech Sight Info: Adequate Hearing Info: Adequate Speech Info: Adequate    SPECIAL CARE FACTORS FREQUENCY  PT (By licensed PT), OT (By licensed OT)     PT Frequency: 5x OT Frequency: 5x            Contractures Contractures Info: Not present    Additional Factors Info  Code Status, Allergies Code Status Info: DNR Allergies Info: promethazine           Current Medications (03/29/2017):  This is the current hospital active medication list Current Facility-Administered Medications  Medication Dose Route Frequency Provider Last Rate Last Dose  . amoxicillin-clavulanate (AUGMENTIN) 875-125 MG per tablet 1 tablet  1 tablet Oral Q12H Theodis Blaze, MD   1 tablet at 03/29/17 1042  . diltiazem (CARDIZEM CD) 24 hr capsule 240 mg  240 mg Oral Daily Kinnie Feil, MD   240 mg at 03/29/17 1042  . insulin aspart (novoLOG) injection 0-9 Units  0-9 Units Subcutaneous TID WC Kinnie Feil, MD   7 Units at 03/29/17 1230  . insulin glargine (LANTUS) injection 10 Units  10 Units Subcutaneous QHS Kinnie Feil, MD   Stopped at 03/28/17 2155  . ipratropium-albuterol (DUONEB) 0.5-2.5 (3) MG/3ML nebulizer solution 3 mL  3 mL Nebulization Q6H PRN Buriev, Arie Sabina, MD      . levothyroxine (SYNTHROID, LEVOTHROID) tablet 100 mcg  100 mcg Oral QAC breakfast Kinnie Feil, MD   100 mcg at 03/29/17 0856  .  metoprolol tartrate (LOPRESSOR) injection 5 mg  5 mg Intravenous Q5 min PRN Blount, Scarlette Shorts T, NP   5 mg at 03/29/17 1310  . morphine CONCENTRATE 10 MG/0.5ML oral solution 5 mg  5 mg Oral Q2H PRN [name], MD   5 mg at 03/29/17 1041  . ondansetron (ZOFRAN) injection 4 mg  4 mg Intravenous Q8H PRN Kinnie Feil, MD   4 mg at 03/27/17 0829  . polyethylene glycol (MIRALAX / GLYCOLAX) packet 17 g  17 g Oral Daily PRN Bonnielee Haff, MD      . senna [facility]) tablet 8.6 mg  1 tablet Oral Daily Bonnielee Haff, MD   8.6  mg at 03/29/17 1231     Discharge Medications: Please see discharge summary for a list of discharge medications.  Relevant Imaging Results:  Relevant Lab Results:   Additional Information SS#[id number]. Needs palliative care to follow at facility  [facility], LCSW

## 2017-03-29 NOTE — Progress Notes (Signed)
[facility] PROGRESS NOTE  [name] LOV:[id number] DOB: 10/08/1933 DOA: 03/24/2017  PCP: Lavone Orn, MD  Brief History/Interval Summary: 84 year old Caucasian male with past medical history of stage IV lung cancer, pancreatic cancer, recent chemotherapy 9 days prior to admission, hypothyroidism presented with abdominal pain nausea vomiting generalized weakness.  Imaging studies raised concern for pneumonia.  Patient was started on antibiotics.  He also developed atrial fibrillation with RVR.  Reason for Visit: Atrial fibrillation with RVR  Consultants: Oncology  Procedures: None  Antibiotics:  Vancomycin 02/15 --> 02/19    Cefepime 02/15 --> 02/19   Augmentin 02/19 -->   Subjective/Interval History: Patient appears to be slightly confused.  His daughter is at the bedside.  Patient denies any chest pain.  Some shortness of breath.  Occasional cough.  No nausea or vomiting.  ROS: Denies any headaches.  Objective:  Vital Signs  Vitals:   03/29/17 0600 03/29/17 0800 03/29/17 0900 03/29/17 1000  BP:   (!) 135/95 129/88  Pulse: 88 (!) 118 (!) 105 (!) 117  Resp: 19 (!) 24 18 (!) 31  Temp:  (!) 97.3 F (36.3 C)    TempSrc:  Oral    SpO2: 98% 100% 97% 98%  Weight:      Height:        Intake/Output Summary (Last 24 hours) at 03/29/2017 1105 Last data filed at 03/29/2017 0500 Gross per 24 hour  Intake 120 ml  Output 560 ml  Net -440 ml   Filed Weights   03/24/17 1334  Weight: 53.5 kg (118 lb)    General appearance: alert, cooperative, distracted and no distress Head: Normocephalic, without obvious abnormality, atraumatic Resp: Coarse breath sounds bilaterally.  Few crackles at the bases. Cardio: S1-S2 is irregularly irregular, mildly tachycardic, no S3-S4.  Systolic murmur appreciated over the precordium. GI: soft, non-tender; bowel sounds normal; no masses,  no organomegaly Extremities: extremities normal, atraumatic, no cyanosis or edema Pulses:  2+ and symmetric Neurologic: Awake alert.  Mildly distracted.  Cranial nerves II through XII intact.  Motor strength equal bilateral upper and lower extremities.  Lab Results:  Data Reviewed: I have personally reviewed following labs and imaging studies  CBC: Recent Labs  Lab 03/24/17 1418 03/25/17 0538 03/27/17 0519 03/28/17 0331 03/29/17 0330  WBC 1.5* 2.0* 2.8* 3.6* 4.3  NEUTROABS 1.0*  --   --   --   --   HGB 13.9 11.9* 11.3* 10.4* 11.7*  HCT 40.0 35.2* 33.8* 30.2* 34.9*  MCV 91.3 92.9 92.6 93.5 93.6  PLT 28* 22* 30* 41* 68*    Basic Metabolic Panel: Recent Labs  Lab 03/24/17 1418 03/25/17 0538 03/27/17 0519 03/28/17 0331 03/29/17 0330  NA 138 139 137 137 140  K 3.8 3.4* 2.7* 4.2 4.0  CL 98* 103 100* 102 103  CO2 27 27 30 27 29   GLUCOSE 352* 267* 79 222* 74  BUN 23* 17 10 13 15   CREATININE 0.98 0.85 0.86 1.09 0.87  CALCIUM 9.1 8.2* 8.0* 7.9* 8.4*  MG  --   --  1.5* 1.6* 1.9    GFR: Estimated Creatinine Clearance: 50.4 mL/min (by C-G formula based on SCr of 0.87 mg/dL).  Liver Function Tests: Recent Labs  Lab 03/24/17 1418  AST 16  ALT 25  ALKPHOS 95  BILITOT 0.9  PROT 6.4*  ALBUMIN 3.5    Recent Labs  Lab 03/24/17 1418  LIPASE 16   CBG: Recent Labs  Lab 03/28/17 1126 03/28/17 1533 03/28/17 1659 03/28/17  2128 03/29/17 0734  GLUCAP 313* 187* 140* 99 124*    Recent Results (from the past 240 hour(s))  Blood culture (routine x 2)     Status: None (Preliminary result)   Collection Time: 03/24/17  8:40 PM  Result Value Ref Range Status   Specimen Description   Final    BLOOD RIGHT ARM Performed at [facility] [address] [place], [place] [address]    Special Requests   Final    BOTTLES DRAWN AEROBIC AND ANAEROBIC Blood Culture adequate volume Performed at [facility] [address] [place], [place] [address]    Culture   Final    NO GROWTH 4 DAYS Performed at [facility],  [place] [address] [place], [place] [address]    Report Status PENDING  Incomplete  Blood culture (routine x 2)     Status: None (Preliminary result)   Collection Time: 03/24/17  8:57 PM  Result Value Ref Range Status   Specimen Description   Final    BLOOD LEFT HAND Performed at [facility] [address], [place], [place] [address]    Special Requests   Final    BOTTLES DRAWN AEROBIC ONLY Blood Culture adequate volume Performed at [facility] [address] [place], [place] [address]    Culture   Final    NO GROWTH 4 DAYS Performed at [facility], [place] [address], [place],  [address]    Report Status PENDING  Incomplete  MRSA PCR Screening     Status: None   Collection Time: 03/24/17 10:48 PM  Result Value Ref Range Status   MRSA by PCR NEGATIVE NEGATIVE Final    Comment:        The GeneXpert MRSA Assay (FDA approved for NASAL specimens only), is one component of a comprehensive MRSA colonization surveillance program. It is not intended to diagnose MRSA infection nor to guide or monitor treatment for MRSA infections. Performed at [facility], [place] [address], [place],  [address]       Radiology Studies: Dg Chest Port 1 View  Result Date: 03/28/2017 CLINICAL DATA:  Follow-up pneumonia. EXAM: PORTABLE CHEST 1 VIEW COMPARISON:  03/24/2017 FINDINGS: Persistent airspace consolidation within the left lung base is again noted. Chronic consolidative changes and/or atelectasis is again noted and appears unchanged from the previous exam. The left upper lobe appears well aerated as does the right lung. IMPRESSION: 1. No change in aeration to the left lung base compared with prior exam. Electronically Signed   By: Kerby Moors M.D.   On: 03/28/2017 09:47     Medications:  Scheduled: . amoxicillin-clavulanate  1 tablet Oral Q12H  . diltiazem  240 mg Oral Daily  . insulin aspart  0-9 Units Subcutaneous  TID WC  . insulin glargine  10 Units Subcutaneous QHS  . levothyroxine  100 mcg Oral QAC breakfast   Continuous:  TDV:[id number], metoprolol tartrate, morphine CONCENTRATE, ondansetron (ZOFRAN) IV  Assessment/Plan:  Active Problems:   Sepsis ([place])    Atrial fibrillation with RVR Heart rate is better controlled today compared to yesterday.  Occasionally goes up to 130 but not sustained.  Patient is asymptomatic.  Continue current regimen with Cardizem 240 mg daily. Also take 60 mg of Cardizem as needed for heart rate greater than 120.  He was on anticoagulation with Xarelto at home which was held due to thrombocytopenia.  Platelet counts have improved.  They  continue to improve anticoagulation may be resumed.  Postobstructive pneumonia with acute hypoxic respiratory failure Improving.  Patient was on vancomycin and cefepime initially.  Changed over to oral Augmentin on 2/19.  Respiratory status is stable.  Continue to monitor for now.  Stage IV lung cancer Followed by oncology.  Based on their notes it does not appear like patient is a candidate for any further treatment.  Patient and daughter are aware.  History of diabetes mellitus type 2 Continue insulin regimen.  Monitor CBGs.  Hypokalemia and hypomagnesemia Potassium and magnesium levels have improved.  Continue to monitor.  Severe protein calorie malnutrition Secondary to cancer.  Encourage oral intake.  Thrombocytopenia Likely secondary to chemotherapy.  Platelet counts have been improving.  No evidence for bleeding.  DVT Prophylaxis: SCDs    Code Status: DNR Family Communication: Discussed with the patient and his daughter Disposition Plan: Management as outlined above.  Transfer to telemetry today.  Continue PT.  Might need to go to skilled nursing facility based on the last note.    LOS: 5 days   [facility] Pager [phone] 03/29/2017, 11:05 AM  If 7PM-7AM, please contact  night-coverage at [email or web address], password [facility]

## 2017-03-29 NOTE — Progress Notes (Signed)
Daily Progress Note   Patient Name: [name]       Date: 03/29/2017 DOB: 1933/12/31  Age: 84 y.o. MRN#: [id number] Attending Physician: Bonnielee Haff, MD Primary Care Physician: Lavone Orn, MD Admit Date: 03/24/2017  Reason for Consultation/Follow-up: Establishing goals of care, Non pain symptom management, Pain control and Psychosocial/spiritual support  Subjective: Patient in a good mood today. Sitting up in bed finishing up lunch. He was very pleased with his meal and how good it was. He ate everything on his tray. No family at the bedside. Awaiting to be transferred out of ICU to [facility] floor. Denies pain or discomfort at this time. He reports working with PT on yesterday and afterwards he felt some pain in his ankles. States staff applied heating packs to both ankles and this helped resolved the pain. Understands that he may have to go to SNF for rehab and he is ok with this knowing it will somewhat improve his quality of life. HR seems to be improving on current regimen.   Chart reviewed and discussed with bedside RN.    Length of Stay: 5  Current Medications: Scheduled Meds:  . amoxicillin-clavulanate  1 tablet Oral Q12H  . diltiazem  240 mg Oral Daily  . insulin aspart  0-9 Units Subcutaneous TID WC  . insulin glargine  10 Units Subcutaneous QHS  . levothyroxine  100 mcg Oral QAC breakfast  . senna  1 tablet Oral Daily    Continuous Infusions:   PRN Meds: ipratropium-albuterol, metoprolol tartrate, morphine CONCENTRATE, ondansetron (ZOFRAN) IV, polyethylene glycol  Physical Exam       Constitutional: He isoriented to person, place, and time. Denies pain.  Pulmonary/Chest:Effort normal.  Musculoskeletal: Generalized weakness  Vital Signs: BP 131/74   Pulse  (!) 121   Temp (!) 97.3 F (36.3 C) (Oral)   Resp 20   Ht 5' 7.5" (1.715 m)   Wt 53.5 kg (118 lb)   SpO2 98%   BMI 18.21 kg/m  SpO2: SpO2: 98 % O2 Device: O2 Device: Not Delivered O2 Flow Rate:    Intake/output summary:   Intake/Output Summary (Last 24 hours) at 03/29/2017 1330 Last data filed at 03/29/2017 1145 Gross per 24 hour  Intake 360 ml  Output 760 ml  Net -400 ml   LBM: Last BM Date: (PTA)  Baseline Weight: Weight: 53.5 kg (118 lb) Most recent weight: Weight: 53.5 kg (118 lb)       Palliative Assessment/Data: PPS 40%    Flowsheet Rows     Most Recent Value  Intake Tab  Referral Department  Hospitalist  Unit at Time of Referral  Med/Surg Unit  Palliative Care Primary Diagnosis  Cancer  Date Notified  03/25/17  Palliative Care Type  New Palliative care  Reason for referral  Counsel Regarding Hospice  Date of Admission  03/24/17  Date first seen by Palliative Care  03/26/17  # of days Palliative referral response time  1 Day(s)  # of days IP prior to Palliative referral  1  Clinical Assessment  Psychosocial & Spiritual Assessment  Palliative Care Outcomes      Patient Active Problem List   Diagnosis Date Noted  . Sepsis ([place]) 03/24/2017  . Metastatic lung carcinoma, left ([place]) 03/03/2017  . Adenocarcinoma of lung, stage 4, right ([place]) 03/03/2017  . Counseling regarding goals of care 03/03/2017  . Unintentional weight loss 12/13/2016  . Wheezing on auscultation 11/02/2015  . Mitral valve insufficiency 11/02/2015  . Murmur 09/24/2015  . Dizziness 09/24/2015  . Lung cancer, lower lobe ([place]) 01/27/2013  . 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 Iron deficiency anemia due to chronic blood loss 01/27/2013  . Atrial fibrillation ([place]) 05/26/2012  . Long term current use of anticoagulant therapy 05/26/2012    Palliative Care Assessment & Plan   Patient Profile: 84 y.o.malewith past medical history of attention, hypothyroidism, stage IV lung  cancer, prior pancreatic canceradmitted on 2/15/2019with A. fib with RVR and respiratory failure likely secondary to pneumonia. Imaging studies confirmed PNA. He agreed to antibiotics and rate control but did not want further aggressive interventions  Assessment:  A-fib RVR  Acute respiratory failure with hypoxia   Hypokalemia   FTT  Thrombocytopenia   Stage IV lung cancer  Hx of pancreatic cancer   Pneumonia   Recommendations/Plan:  DNR/DI  Roxanol 5mg  PO PRN for pain and shortness of breath (Per Dr. Marin Olp)  Agree with PT recommendations of SNF/supervision with mobility (patient is aware and accepting of this if needed) Daughter verbalized that mother is unable to care for him at home at this stage due to her own medical conditions. Feels this would be a better option for patient and his safety.   CXR showed no changes compared to previous on 2/15, Respiratory status is stable and he is on oral Augmentin. Per attending will continue to monitor.   Palliative team will continue to follow with providers and support patient and family during this hospitalization as needed.   When patient is discharged would recommend SNF rehab with outpatient Palliative services.   Goals of Care and Additional Recommendations:  Limitations on Scope of Treatment: Full Scope Treatment  Code Status:    Code Status Orders  (From admission, onward)        Start     Ordered   03/24/17 1753  Do not attempt resuscitation (DNR)  Continuous    Question Answer Comment  In the event of cardiac or respiratory ARREST Do not call a "code blue"   In the event of cardiac or respiratory ARREST Do not perform Intubation, CPR, defibrillation or ACLS   In the event of cardiac or respiratory ARREST Use medication by any route, position, wound care, and other measures to relive pain and suffering. May use oxygen, suction and manual treatment of airway obstruction as  needed for comfort.      03/24/17  1755    Code Status History    Date Active Date Inactive Code Status Order ID Comments User Context   03/24/2017 15:38 03/24/2017 17:55 DNR [id number]  Drenda Freeze, MD ED    Advance Directive Documentation     Most Recent Value  Type of Advance Directive  Healthcare Power of Attorney  Pre-existing out of facility DNR order (yellow form or pink MOST form)  No data  "MOST" Form in Place?  No data     Prognosis:   Unable to determine Anticipate poor prognosis in future given malignancy and discontinuation of further treatments.  Discharge Planning:  [facility] for rehab with Palliative care service follow-up  Care plan was discussed with patient and bedside RN.   Thank you for allowing the Palliative Medicine Team to assist in the care of this patient.   Total Time 20 min. Prolonged Time Billed  No      Greater than 50%  of this time was spent counseling and coordinating care related to the above assessment and plan.  Alda Lea, AGNP-C Palliative Medicine Team  Phone: [phone] Fax: [phone]  Please contact Palliative Medicine Team phone at [phone] for questions and concerns.

## 2017-03-30 DIAGNOSIS — C3430 Malignant neoplasm of lower lobe, unspecified bronchus or lung: Secondary | ICD-10-CM

## 2017-03-30 DIAGNOSIS — I10 Essential (primary) hypertension: Secondary | ICD-10-CM

## 2017-03-30 LAB — DIFFERENTIAL
Basophils Absolute: 0 10*3/uL (ref 0.0–0.1)
Basophils Relative: 0 %
EOS ABS: 0.1 10*3/uL (ref 0.0–0.7)
EOS PCT: 2 %
LYMPHS ABS: 0.6 10*3/uL — AB (ref 0.7–4.0)
Lymphocytes Relative: 20 %
MONO ABS: 1 10*3/uL (ref 0.1–1.0)
MONOS PCT: 32 %
Neutro Abs: 1.5 10*3/uL — ABNORMAL LOW (ref 1.7–7.7)
Neutrophils Relative %: 46 %

## 2017-03-30 LAB — BASIC METABOLIC PANEL
Anion gap: 8 (ref 5–15)
BUN: 14 mg/dL (ref 6–20)
CHLORIDE: 99 mmol/L — AB (ref 101–111)
CO2: 30 mmol/L (ref 22–32)
CREATININE: 0.95 mg/dL (ref 0.61–1.24)
Calcium: 8.4 mg/dL — ABNORMAL LOW (ref 8.9–10.3)
GFR calc Af Amer: >60 mL/min (ref >60)
GFR calc non Af Amer: >60 mL/min (ref >60)
GLUCOSE: 132 mg/dL — AB (ref 65–99)
Potassium: 4.9 mmol/L (ref 3.5–5.1)
SODIUM: 137 mmol/L (ref 135–145)

## 2017-03-30 LAB — GLUCOSE, CAPILLARY
GLUCOSE-CAPILLARY: 98 mg/dL (ref 65–99)
GLUCOSE-CAPILLARY: 195 mg/dL — AB (ref 65–99)
Glucose-Capillary: 94 mg/dL (ref 65–99)
Glucose-Capillary: 62 mg/dL — ABNORMAL LOW (ref 65–99)
Glucose-Capillary: 64 mg/dL — ABNORMAL LOW (ref 65–99)

## 2017-03-30 LAB — CBC
HCT: 36.3 % — ABNORMAL LOW (ref 39.0–52.0)
HEMOGLOBIN: 12 g/dL — AB (ref 13.0–17.0)
MCH: 31.7 pg (ref 26.0–34.0)
MCHC: 33.1 g/dL (ref 30.0–36.0)
MCV: 95.8 fL (ref 78.0–100.0)
Platelets: 108 10*3/uL — ABNORMAL LOW (ref 150–400)
RBC: 3.79 MIL/uL — ABNORMAL LOW (ref 4.22–5.81)
RDW: 14.3 % (ref 11.5–15.5)
WBC: 3.1 10*3/uL — ABNORMAL LOW (ref 4.0–10.5)

## 2017-03-30 LAB — HEPATIC FUNCTION PANEL
ALT: 47 U/L (ref 17–63)
AST: 20 U/L (ref 15–41)
Albumin: 2.7 g/dL — ABNORMAL LOW (ref 3.5–5.0)
Alkaline Phosphatase: 241 U/L — ABNORMAL HIGH (ref 38–126)
BILIRUBIN TOTAL: 0.6 mg/dL (ref 0.3–1.2)
Bilirubin, Direct: 0.2 mg/dL (ref 0.1–0.5)
Indirect Bilirubin: 0.4 mg/dL (ref 0.3–0.9)
Total Protein: 5.6 g/dL — ABNORMAL LOW (ref 6.5–8.1)

## 2017-03-30 MED ORDER — GLUCOSE 40 % PO GEL
ORAL | Status: AC
  Administered 2017-03-30: 37.5 g
  Filled 2017-03-30: qty 1

## 2017-03-30 NOTE — Progress Notes (Signed)
[facility] PROGRESS NOTE  [name] HQP:[id number] DOB: 1933/12/18 DOA: 03/24/2017  PCP: Lavone Orn, MD  Brief History/Interval Summary: 84 year old Caucasian male with past medical history of stage IV lung cancer, pancreatic cancer, recent chemotherapy 9 days prior to admission, hypothyroidism presented with abdominal pain nausea vomiting generalized weakness.  Imaging studies raised concern for pneumonia.  Patient was started on antibiotics.  He also developed atrial fibrillation with RVR.  Reason for Visit: Atrial fibrillation with RVR  Consultants: Oncology  Procedures: None  Antibiotics:  Vancomycin 02/15 --> 02/19    Cefepime 02/15 --> 02/19   Augmentin 02/19 -->   Subjective/Interval History: Patient remains distracted.  However he states that he is feeling well.  He denies any chest pain or shortness of breath.    ROS: Denies any headaches.  Objective:  Vital Signs  Vitals:   03/29/17 1957 03/29/17 2116 03/30/17 0449 03/30/17 0853  BP: (!) 152/116 (!) 148/82 119/68 (!) 139/99  Pulse: (!) 48 89 77   Resp: 13  16   Temp: 98.2 F (36.8 C)  98.2 F (36.8 C)   TempSrc: Oral  Oral   SpO2: 93%  97%   Weight:      Height:        Intake/Output Summary (Last 24 hours) at 03/30/2017 0925 Last data filed at 03/30/2017 0449 Gross per 24 hour  Intake 596 ml  Output 600 ml  Net -4 ml   Filed Weights   03/24/17 1334  Weight: 53.5 kg (118 lb)    General appearance: Awake alert.  Distracted.  In no distress. Resp: Coarse breath sounds bilaterally.  Few crackles at the bases but mostly clear to auscultation. Cardio: S1-S2 remains irregularly irregular.  No S3-S4.  Heart rate is better controlled today.  Systolic murmur appreciated over the precordium.   GI: Abdomen soft.  Nontender nondistended.  Bowel sounds present and normal. Extremities: No edema Neurologic: Awake alert.  Mildly distracted.  Cranial nerves II through XII intact.  Motor  strength equal bilateral upper and lower extremities.  Lab Results:  Data Reviewed: I have personally reviewed following labs and imaging studies  CBC: Recent Labs  Lab 03/24/17 1418 03/25/17 0538 03/27/17 0519 03/28/17 0331 03/29/17 0330 03/30/17 0548  WBC 1.5* 2.0* 2.8* 3.6* 4.3 3.1*  NEUTROABS 1.0*  --   --   --   --  1.5*  HGB 13.9 11.9* 11.3* 10.4* 11.7* 12.0*  HCT 40.0 35.2* 33.8* 30.2* 34.9* 36.3*  MCV 91.3 92.9 92.6 93.5 93.6 95.8  PLT 28* 22* 30* 41* 68* 108*    Basic Metabolic Panel: Recent Labs  Lab 03/25/17 0538 03/27/17 0519 03/28/17 0331 03/29/17 0330 03/30/17 0548  NA 139 137 137 140 137  K 3.4* 2.7* 4.2 4.0 4.9  CL 103 100* 102 103 99*  CO2 27 30 27 29 30   GLUCOSE 267* 79 222* 74 132*  BUN 17 10 13 15 14   CREATININE 0.85 0.86 1.09 0.87 0.95  CALCIUM 8.2* 8.0* 7.9* 8.4* 8.4*  MG  --  1.5* 1.6* 1.9  --     GFR: Estimated Creatinine Clearance: 46.1 mL/min (by C-G formula based on SCr of 0.95 mg/dL).  Liver Function Tests: Recent Labs  Lab 03/24/17 1418 03/30/17 0548  AST 16 20  ALT 25 47  ALKPHOS 95 241*  BILITOT 0.9 0.6  PROT 6.4* 5.6*  ALBUMIN 3.5 2.7*    Recent Labs  Lab 03/24/17 1418  LIPASE 16   CBG: Recent Labs  Lab 03/29/17 0734 03/29/17 1223 03/29/17 1642 03/29/17 2002 03/30/17 0753  GLUCAP 124* 313* 156* 145* 98    Recent Results (from the past 240 hour(s))  Blood culture (routine x 2)     Status: None   Collection Time: 03/24/17  8:40 PM  Result Value Ref Range Status   Specimen Description   Final    BLOOD RIGHT ARM Performed at [facility] [address], [place], [place] [address]    Special Requests   Final    BOTTLES DRAWN AEROBIC AND ANAEROBIC Blood Culture adequate volume Performed at [facility] [address] [place], [place] [address]    Culture   Final    NO GROWTH 5 DAYS Performed at [facility], [place] [address], [place], [place] [address]     Report Status 03/29/2017 FINAL  Final  Blood culture (routine x 2)     Status: None   Collection Time: 03/24/17  8:57 PM  Result Value Ref Range Status   Specimen Description   Final    BLOOD LEFT HAND Performed at [facility] [address], [place], [place] [address]    Special Requests   Final    BOTTLES DRAWN AEROBIC ONLY Blood Culture adequate volume Performed at [facility] [address] [place], [place] [address]    Culture   Final    NO GROWTH 5 DAYS Performed at [facility], [place] [address] [place], [place] [address]    Report Status 03/29/2017 FINAL  Final  MRSA PCR Screening     Status: None   Collection Time: 03/24/17 10:48 PM  Result Value Ref Range Status   MRSA by PCR NEGATIVE NEGATIVE Final    Comment:        The GeneXpert MRSA Assay (FDA approved for NASAL specimens only), is one component of a comprehensive MRSA colonization surveillance program. It is not intended to diagnose MRSA infection nor to guide or monitor treatment for MRSA infections. Performed at [facility], [place] [address] [place], [place] [address]       Radiology Studies: Dg Chest Port 1 View  Result Date: 03/28/2017 CLINICAL DATA:  Follow-up pneumonia. EXAM: PORTABLE CHEST 1 VIEW COMPARISON:  03/24/2017 FINDINGS: Persistent airspace consolidation within the left lung base is again noted. Chronic consolidative changes and/or atelectasis is again noted and appears unchanged from the previous exam. The left upper lobe appears well aerated as does the right lung. IMPRESSION: 1. No change in aeration to the left lung base compared with prior exam. Electronically Signed   By: Kerby Moors M.D.   On: 03/28/2017 09:47     Medications:  Scheduled: . amoxicillin-clavulanate  1 tablet Oral Q12H  . diltiazem  240 mg Oral Daily  . insulin aspart  0-9 Units Subcutaneous TID WC  . insulin glargine  10 Units Subcutaneous QHS  .  levothyroxine  100 mcg Oral QAC breakfast  . senna  1 tablet Oral Daily   Continuous:  OIN:OMVEHMCNOBS, ipratropium-albuterol, metoprolol tartrate, morphine CONCENTRATE, ondansetron (ZOFRAN) IV, polyethylene glycol  Assessment/Plan:  Active Problems:   Sepsis (New[place])    Atrial fibrillation with RVR Heart rate is better controlled.  Occasional episodes of bradycardia but mostly in the 80s and 90s.  Continue current regimen for now.  Cardizem 240 mg daily.  At home he also takes 60 mg of Cardizem as needed for heart rate greater than 120.  He was on  anticoagulation with Xarelto at home which was held due to thrombocytopenia.  Platelet counts continue to improve.  Okay to resume Xarelto at discharge.    Postobstructive pneumonia with acute hypoxic respiratory failure Patient continues to improve.  Patient was on vancomycin and cefepime initially.  Changed over to oral Augmentin on 2/19.  Respiratory status is stable.  Continue to monitor for now.  Stage IV lung cancer Followed by oncology.  Based on their notes it does not appear like patient is a candidate for any further treatment.  Patient and daughter are aware.  Palliative medicine has seen the patient.  He will go to skilled nursing facility with palliative medicine to follow.  History of diabetes mellitus type 2 Continue insulin regimen.  Monitor CBGs.  Hypokalemia and hypomagnesemia Potassium and magnesium levels have improved.  Severe protein calorie malnutrition Secondary to cancer.  Encourage oral intake.  Thrombocytopenia Likely secondary to chemotherapy.  Platelet counts have been improving.  No evidence for bleeding.  DVT Prophylaxis: SCDs    Code Status: DNR Family Communication: Discussed with the patient.  No family at bedside today. Disposition Plan: Management as outlined above.  Skilled nursing facility is recommended by physical therapy.  Social worker is following.  Anticipate discharge tomorrow.  Palliative  medicine to follow at skilled nursing facility.    LOS: 6 days   [facility] Pager [phone] 03/30/2017, 9:25 AM  If 7PM-7AM, please contact night-coverage at [email or web address], password [facility]

## 2017-03-30 NOTE — Care Management Important Message (Signed)
Important Message  Patient Details  Name: [name] MRN: [id number] Date of Birth: 21-Dec-1933   Medicare Important Message Given:  Yes    Kerin Salen 03/30/2017, 10:28 [place] Message  Patient Details  Name: [name] MRN: [id number] Date of Birth: [birth year]   Medicare Important Message Given:  Yes    Kerin Salen 03/30/2017, 10:28 AM

## 2017-03-30 NOTE — Progress Notes (Signed)
[name] is now out of the ICU.  He is up on 5 E.  He is having some blood pressure issues.  His heart rate seems to be doing fairly well.  He still is in atrial fibrillation.  It looks like he may ask to go to skilled nursing.  I am unsure if this is truly the case.  He is hungry.  He is having no problems with diarrhea.  Is having no nausea or vomiting.  He denies any cough.  He has no shortness of breath.  There are no labs back from today yet.  On his physical exam, there really is no change.  He will be interesting to see what the discharge plans are for [name].   I am glad to see that he is getting better physically.  He just looks a little bit better.  He sounds stronger.  Currently, his performance status is ECOG 3.  He is not even close to being a candidate for any type of chemotherapy.  [name] 2:13

## 2017-03-31 DIAGNOSIS — T383X5A Adverse effect of insulin and oral hypoglycemic [antidiabetic] drugs, initial encounter: Secondary | ICD-10-CM

## 2017-03-31 DIAGNOSIS — E160 Drug-induced hypoglycemia without coma: Secondary | ICD-10-CM

## 2017-03-31 LAB — GLUCOSE, CAPILLARY
GLUCOSE-CAPILLARY: 103 mg/dL — AB (ref 65–99)
GLUCOSE-CAPILLARY: 142 mg/dL — AB (ref 65–99)
GLUCOSE-CAPILLARY: 59 mg/dL — AB (ref 65–99)
GLUCOSE-CAPILLARY: 59 mg/dL — AB (ref 65–99)
GLUCOSE-CAPILLARY: 75 mg/dL (ref 65–99)
Glucose-Capillary: 51 mg/dL — ABNORMAL LOW (ref 65–99)
Glucose-Capillary: 70 mg/dL (ref 65–99)
Glucose-Capillary: 77 mg/dL (ref 65–99)
Glucose-Capillary: 83 mg/dL (ref 65–99)
Glucose-Capillary: 90 mg/dL (ref 65–99)

## 2017-03-31 LAB — CBC
HEMATOCRIT: 40.4 % (ref 39.0–52.0)
HEMOGLOBIN: 12.9 g/dL — AB (ref 13.0–17.0)
MCH: 30.9 pg (ref 26.0–34.0)
MCHC: 31.9 g/dL (ref 30.0–36.0)
MCV: 96.9 fL (ref 78.0–100.0)
Platelets: 157 10*3/uL (ref 150–400)
RBC: 4.17 MIL/uL — AB (ref 4.22–5.81)
RDW: 14.6 % (ref 11.5–15.5)
WBC: 2.8 10*3/uL — ABNORMAL LOW (ref 4.0–10.5)

## 2017-03-31 MED ORDER — MORPHINE SULFATE (CONCENTRATE) 10 MG/0.5ML PO SOLN: 5.0000 mg | ORAL | 0 refills | Status: AC | PRN

## 2017-03-31 MED ORDER — IPRATROPIUM-ALBUTEROL 0.5-2.5 (3) MG/3ML IN SOLN: 3.0000 mL | Freq: Four times a day (QID) | RESPIRATORY_TRACT | Status: AC | PRN

## 2017-03-31 MED ORDER — AMOXICILLIN-POT CLAVULANATE 875-125 MG PO TABS: 1.0000 | ORAL_TABLET | Freq: Two times a day (BID) | ORAL | Status: AC

## 2017-03-31 MED ORDER — DEXTROSE 50 % IV SOLN
INTRAVENOUS | Status: AC
  Administered 2017-03-31: 50 mL
  Filled 2017-03-31: qty 50

## 2017-03-31 MED ORDER — LORAZEPAM 0.5 MG PO TABS: 0.5000 mg | ORAL_TABLET | Freq: Four times a day (QID) | ORAL | 0 refills | Status: DC | PRN

## 2017-03-31 MED ORDER — DEXTROSE 50 % IV SOLN
INTRAVENOUS | Status: AC
  Administered 2017-03-31: 08:00:00
  Filled 2017-03-31: qty 50

## 2017-03-31 MED ORDER — DEXTROSE 50 % IV SOLN
25.0000 mL | Freq: Once | INTRAVENOUS | Status: AC
  Administered 2017-03-31: 25 mL via INTRAVENOUS

## 2017-03-31 MED ORDER — SENNA 8.6 MG PO TABS: 1.0000 | ORAL_TABLET | Freq: Every day | ORAL | 0 refills | Status: AC

## 2017-03-31 MED ORDER — POLYETHYLENE GLYCOL 3350 17 G PO PACK: 17.0000 g | PACK | Freq: Every day | ORAL | 0 refills | Status: AC | PRN

## 2017-03-31 NOTE — Progress Notes (Signed)
[facility] PROGRESS NOTE  [name] VOZ:[id number] DOB: [birth year] DOA: 03/24/2017  PCP: Lavone Orn, MD  Brief History/Interval Summary: 84 year old Caucasian male with past medical history of stage IV lung cancer, pancreatic cancer, recent chemotherapy 9 days prior to admission, hypothyroidism presented with abdominal pain nausea vomiting generalized weakness.  Imaging studies raised concern for pneumonia.  Patient was started on antibiotics.  He also developed atrial fibrillation with RVR.  Reason for Visit: Atrial fibrillation with RVR. Hypoglycemia  Consultants: Oncology  Procedures: None  Antibiotics:  Vancomycin 02/15 --> 02/19    Cefepime 02/15 --> 02/19   Augmentin 02/19 -->   Subjective/Interval History: Patient slow to respond this morning. Likely due to low glucose levels.   ROS: Denies any chest pain.  Objective:  Vital Signs  Vitals:   03/30/17 1317 03/30/17 2123 03/31/17 0425 03/31/17 0803  BP: 107/70 112/76 (!) 142/79 (!) 166/66  Pulse: 69 63 96   Resp: 16 17 16  (!) 22  Temp: 98.5 F (36.9 C) 99.2 F (37.3 C) 98.2 F (36.8 C) 98.2 F (36.8 C)  TempSrc: Oral Oral Oral   SpO2: 96% 96% 100% 92%  Weight:      Height:        Intake/Output Summary (Last 24 hours) at 03/31/2017 1139 Last data filed at 03/31/2017 0948 Gross per 24 hour  Intake 120 ml  Output 825 ml  Net -705 ml   Filed Weights   03/24/17 1334  Weight: 53.5 kg (118 lb)    General appearance: Slow to respond but arousable. No distress. Resp: Good air entry bilaterally. No wheezing rales or rhonchi.  Cardio: S1S2 irregularly irregular. Tachy this AM.   GI: Abdomen remains soft. Non tender. Extremities: No edema Neurologic: No obvious focal deficits noted.   Lab Results:  Data Reviewed: I have personally reviewed following labs and imaging studies  CBC: Recent Labs  Lab 03/24/17 1418  03/27/17 0519 03/28/17 0331 03/29/17 0330 03/30/17 0548  03/31/17 0546  WBC 1.5*   < > 2.8* 3.6* 4.3 3.1* 2.8*  NEUTROABS 1.0*  --   --   --   --  1.5*  --   HGB 13.9   < > 11.3* 10.4* 11.7* 12.0* 12.9*  HCT 40.0   < > 33.8* 30.2* 34.9* 36.3* 40.4  MCV 91.3   < > 92.6 93.5 93.6 95.8 96.9  PLT 28*   < > 30* 41* 68* 108* 157   < > = values in this interval not displayed.    Basic Metabolic Panel: Recent Labs  Lab 03/25/17 0538 03/27/17 0519 03/28/17 0331 03/29/17 0330 03/30/17 0548  NA 139 137 137 140 137  K 3.4* 2.7* 4.2 4.0 4.9  CL 103 100* 102 103 99*  CO2 27 30 27 29 30   GLUCOSE 267* 79 222* 74 132*  BUN 17 10 13 15 14   CREATININE 0.85 0.86 1.09 0.87 0.95  CALCIUM 8.2* 8.0* 7.9* 8.4* 8.4*  MG  --  1.5* 1.6* 1.9  --     GFR: Estimated Creatinine Clearance: 46.1 mL/min (by C-G formula based on SCr of 0.95 mg/dL).  Liver Function Tests: Recent Labs  Lab 03/24/17 1418 03/30/17 0548  AST 16 20  ALT 25 47  ALKPHOS 95 241*  BILITOT 0.9 0.6  PROT 6.4* 5.6*  ALBUMIN 3.5 2.7*    Recent Labs  Lab 03/24/17 1418  LIPASE 16   CBG: Recent Labs  Lab 03/31/17 0054 03/31/17 0726 03/31/17 0810 03/31/17 0939 03/31/17  [place] 70 77 83    Recent Results (from the past 240 hour(s))  Blood culture (routine x 2)     Status: None   Collection Time: 03/24/17  8:40 PM  Result Value Ref Range Status   Specimen Description   Final    BLOOD RIGHT ARM Performed at [facility] [address], [place], [place] [address]    Special Requests   Final    BOTTLES DRAWN AEROBIC AND ANAEROBIC Blood Culture adequate volume Performed at [facility] [address] [place], [place] [address]    Culture   Final    NO GROWTH 5 DAYS Performed at [facility], [place] [address] [place], [place] [address]    Report Status 03/29/2017 FINAL  Final  Blood culture (routine x 2)     Status: None   Collection Time: 03/24/17  8:57 PM  Result Value Ref Range Status   Specimen  Description   Final    BLOOD LEFT HAND Performed at [facility] [address] [place], [place] [address]    Special Requests   Final    BOTTLES DRAWN AEROBIC ONLY Blood Culture adequate volume Performed at [facility] [address], [place], [place] [address]    Culture   Final    NO GROWTH 5 DAYS Performed at [facility], [place] [address] [place], [place] [address]    Report Status 03/29/2017 FINAL  Final  MRSA PCR Screening     Status: None   Collection Time: 03/24/17 10:48 PM  Result Value Ref Range Status   MRSA by PCR NEGATIVE NEGATIVE Final    Comment:        The GeneXpert MRSA Assay (FDA approved for NASAL specimens only), is one component of a comprehensive MRSA colonization surveillance program. It is not intended to diagnose MRSA infection nor to guide or monitor treatment for MRSA infections. Performed at [facility], [place] [address] [place], [place] [address]       Radiology Studies: No results found.   Medications:  Scheduled: . amoxicillin-clavulanate  1 tablet Oral Q12H  . diltiazem  240 mg Oral Daily  . insulin aspart  0-9 Units Subcutaneous TID WC  . levothyroxine  100 mcg Oral QAC breakfast  . senna  1 tablet Oral Daily   Continuous:  VZD:GLOVFIEPPIR, ipratropium-albuterol, morphine CONCENTRATE, ondansetron (ZOFRAN) IV, polyethylene glycol  Assessment/Plan:  Active Problems:   Sepsis ([place])    Atrial fibrillation with RVR Heart rate noted to be elevated this morning.  Patient also noted to have low blood glucose levels.  He was given as needed dose of metoprolol and was given his Cardizem.  Improved to 90s after a couple hours.  Continue current regimen for now.  Cardizem 240 mg daily.  At home he also takes 60 mg of Cardizem as needed for heart rate greater than 120.  He was on anticoagulation with Xarelto at home which was held due to thrombocytopenia.  Platelet counts  continue to improve.  Okay to resume Xarelto at discharge.    Postobstructive pneumonia with acute hypoxic respiratory failure Patient continues to improve.  Patient was on vancomycin and cefepime initially.  Changed over to oral Augmentin on 2/19.  Respiratory status remains stable.  Continue to monitor for now.  Stage IV lung cancer Followed by oncology.  Based on their notes it does not appear like patient is a candidate  for any further treatment.  Patient and daughter are aware.  Palliative medicine has seen the patient.  He will go to skilled nursing facility with palliative medicine to follow.  History of diabetes mellitus type 2 with episodes of hypoglycemia He is receiving Lantus every night.  His glucose was noted to be low overnight.  Apparently he has had poor oral intake yesterday.  We will discontinue his Lantus.  Despite giving him something to eat and D50, glucose remains low.  Patient to have lunch.  Mental status remains stable.  HbA1c was 9.8 in January.  Hypokalemia and hypomagnesemia Potassium and magnesium levels have improved.  Severe protein calorie malnutrition Secondary to cancer.  Encourage oral intake.  Thrombocytopenia Likely secondary to chemotherapy.  Platelet counts have been improving.  No evidence for bleeding.  DVT Prophylaxis: SCDs    Code Status: DNR Family Communication: Discussed with the patient.  No family at bedside today. Disposition Plan: Management as outlined above.  Will hold off on discharge today due to low blood glucose levels.  He will go to skilled nursing facility at discharge which will hopefully be tomorrow. Palliative medicine to follow at skilled nursing facility.    LOS: 7 days   [facility] Pager [phone] 03/31/2017, 11:39 AM  If 7PM-7AM, please contact night-coverage at [email or web address], password [facility]

## 2017-03-31 NOTE — Progress Notes (Signed)
NT notified this RN of the patient Blood sugar. NT gave apple juice and crackers to eat. When this RN came to check on patient he had barely ate or drank any of the snack. Went ahead and gave D 5% 40ml @0755 . Will recheck blood sugar

## 2017-03-31 NOTE — Progress Notes (Addendum)
Patient HR in the 130's at 0800. Paged MD [place] [facility]. Gave PRN lopressor. Hr still in the 120's and having bigeminy PVC. Gave scheduled Cardizem PO.  MD paged and made aware and at the bedside to see patient.

## 2017-03-31 NOTE — Progress Notes (Signed)
Hypoglycemic Event  CBG: 59  Treatment: D50 IV 25 mL  Symptoms: Hungry  Follow-up CBG: Time:  1428 CBG Result:103  Possible Reasons for Event: Medication regimen   Comments/MD notified: MD aware no new orders at this time. Patient going to be monitored overnight before discharge.     [name] L

## 2017-03-31 NOTE — Progress Notes (Signed)
LCSW following for SNF placement.   Patient has bed at [facility].   Patient not ready for dc today.   LCSW will continue to follow.  Carolin Coy [place]

## 2017-03-31 NOTE — Progress Notes (Signed)
CRITICAL VALUE ALERT  Critical Value:  cbg 62  Date & Time Notied:  03/30/17  Provider Notified: yes  Orders Received/Actions taken: no orders received cbg 142 after gel/d50

## 2017-03-31 NOTE — Progress Notes (Signed)
Hypoglycemic Event  CBG: 51  Treatment: D50 IV 25 mL  Symptoms: Shaky  Follow-up CBG: Time:0810 CBG Result:70  Possible Reasons for Event: inadequate meal intake, medication   Comments/MD notified:MD notified     [name] L

## 2017-03-31 NOTE — Progress Notes (Signed)
Physical Therapy Treatment Patient Details Name: [name] MRN: [id number] DOB: Dec 25, 1933 Today's Date: 03/31/2017    History of Present Illness  84 y.o. male with PMH of HTN, Hypothyroidism, Stage Iv lung cancer (mets pleural/bone), h/o pancreatic ca, recent chemotherapy 9 days ago presented with worsening abdominal pains, nausea, cough and generalized weakness. Yesterday, he tried to get out of bed but unable to stand then fell on the ground striking his head. He reports progressive weakness, he is unable to do his ADL. He reports cough, dyspnea, pleur tic chest pain    PT Comments    Decreased alertness and impaired cognition on today. Deferred OOB mobility for safety reasons. Bed level exercises only this session with varying level of assistance required. Spoke with RN who reported pt  had some issues with blood sugar regulation. Will continue to follow. Continue to recommend SNF.    Follow Up Recommendations  SNF     Equipment Recommendations  (TBD at next venue)    Recommendations for Other Services       Precautions / Restrictions Precautions Precautions: Fall Precaution Comments: monitor HR, reports 2 falls at a park last summer    Mobility  Bed Mobility               General bed mobility comments: NT-pt appeared confused and had trouble following commands. Deferred for safety.   Transfers                    Ambulation/Gait                 Stairs            Wheelchair Mobility    Modified Rankin (Stroke Patients Only)       Balance                                            Cognition     Overall Cognitive Status: Impaired/Different from baseline Area of Impairment: Following commands;Problem solving                       Following Commands: Follows multi-step commands inconsistently     Problem Solving: Slow processing;Requires verbal cues;Requires tactile cues;Decreased initiation         Exercises General Exercises - Upper Extremity Shoulder Flexion: AAROM;PROM;Both;10 reps;Supine Elbow Flexion: AAROM;PROM;Both;10 reps;Supine General Exercises - Lower Extremity Heel Slides: AAROM;Both;10 reps;Supine    General Comments        Pertinent Vitals/Pain Pain Assessment: Faces Faces Pain Scale: Hurts even more Pain Location: pt could not stated. grimaced with movement of LEs during exercises Pain Descriptors / Indicators: Grimacing;Moaning Pain Intervention(s): Limited activity within patient's tolerance    Home Living                      Prior Function            PT Goals (current goals can now be found in the care plan section) Progress towards PT goals: Not progressing toward goals - comment(decreased alertness, impaired cognition this session)    Frequency    Min 2X/week      PT Plan Frequency needs to be updated    Co-evaluation              AM-PAC PT "6 Clicks" Daily Activity  Outcome Measure  Difficulty  turning over in bed (including adjusting bedclothes, sheets and blankets)?: Unable Difficulty moving from lying on back to sitting on the side of the bed? : Unable Difficulty sitting down on and standing up from a chair with arms (e.g., wheelchair, bedside commode, etc,.)?: Unable Help needed moving to and from a bed to chair (including a wheelchair)?: Total Help needed walking in hospital room?: Total Help needed climbing 3-5 steps with a railing? : Total 6 Click Score: 6    End of Session   Activity Tolerance: Other (comment)(varying level of alertness. Also pt appears confused) Patient left: in bed;with call bell/phone within reach;with bed alarm set   PT Visit Diagnosis: History of falling (Z91.81);Difficulty in walking, not elsewhere classified (R26.2)     Time: 1410-3013 PT Time Calculation (min) (ACUTE ONLY): 14 min  Charges:  $Therapeutic Exercise: 8-22 mins                    G Codes:          Weston Anna, MPT Pager: [phone]

## 2017-04-01 DIAGNOSIS — E162 Hypoglycemia, unspecified: Secondary | ICD-10-CM

## 2017-04-01 LAB — GLUCOSE, CAPILLARY
GLUCOSE-CAPILLARY: 46 mg/dL — AB (ref 65–99)
GLUCOSE-CAPILLARY: 91 mg/dL (ref 65–99)
GLUCOSE-CAPILLARY: 328 mg/dL — AB (ref 65–99)
Glucose-Capillary: 71 mg/dL (ref 65–99)
Glucose-Capillary: 276 mg/dL — ABNORMAL HIGH (ref 65–99)
Glucose-Capillary: 293 mg/dL — ABNORMAL HIGH (ref 65–99)
Glucose-Capillary: 372 mg/dL — ABNORMAL HIGH (ref 65–99)

## 2017-04-01 MED ORDER — ENSURE ENLIVE PO LIQD
237.0000 mL | Freq: Three times a day (TID) | ORAL | Status: DC
  Administered 2017-04-01 – 2017-04-02 (×5): 237 mL via ORAL

## 2017-04-01 NOTE — Progress Notes (Signed)
[facility] PROGRESS NOTE  [name] EPP:[id number] DOB: 25-Jul-1933 DOA: 03/24/2017  PCP: Lavone Orn, MD  Brief History/Interval Summary: 84 year old Caucasian male with past medical history of stage IV lung cancer, pancreatic cancer, recent chemotherapy 9 days prior to admission, hypothyroidism presented with abdominal pain nausea vomiting generalized weakness.  Imaging studies raised concern for pneumonia.  Patient was started on antibiotics.  He also developed atrial fibrillation with RVR.  Most of his issues have been stable.  Over the last 24 hours he has been noted to have frequent readings of low glucose levels.  Reason for Visit: Atrial fibrillation with RVR. Hypoglycemia  Consultants: Oncology  Procedures: None  Antibiotics:  Vancomycin 02/15 --> 02/19    Cefepime 02/15 --> 02/19   Augmentin 02/19 -->   Subjective/Interval History: Patient again noted to have hypoglycemia this morning.  He denies any complaints currently.  Noted to be distracted as before.   Objective:  Vital Signs  Vitals:   03/31/17 0803 03/31/17 1416 03/31/17 2107 04/01/17 0602  BP: (!) 166/66 112/62 114/67 110/68  Pulse:  73 95 97  Resp: (!) 22 16 19 19   Temp: 98.2 F (36.8 C) (!) 97.5 F (36.4 C) 97.6 F (36.4 C) 97.6 F (36.4 C)  TempSrc:  Axillary Oral Oral  SpO2: 92% 94% 93% 97%  Weight:      Height:        Intake/Output Summary (Last 24 hours) at 04/01/2017 0930 Last data filed at 04/01/2017 0604 Gross per 24 hour  Intake 480 ml  Output 450 ml  Net 30 ml   Filed Weights   03/24/17 1334  Weight: 53.5 kg (118 lb)    General appearance: Distracted.  In no distress. Resp: Clear to auscultation bilaterally. Cardio: S1-S2 is irregularly irregular.  Normal rate this morning. GI: Abdomen remains soft.  Nontender nondistended.  No masses organomegaly.  Bowel sounds present. Extremities: No edema Neurologic: No obvious focal deficits noted.   Lab  Results:  Data Reviewed: I have personally reviewed following labs and imaging studies  CBC: Recent Labs  Lab 03/27/17 0519 03/28/17 0331 03/29/17 0330 03/30/17 0548 03/31/17 0546  WBC 2.8* 3.6* 4.3 3.1* 2.8*  NEUTROABS  --   --   --  1.5*  --   HGB 11.3* 10.4* 11.7* 12.0* 12.9*  HCT 33.8* 30.2* 34.9* 36.3* 40.4  MCV 92.6 93.5 93.6 95.8 96.9  PLT 30* 41* 68* 108* 606    Basic Metabolic Panel: Recent Labs  Lab 03/27/17 0519 03/28/17 0331 03/29/17 0330 03/30/17 0548  NA 137 137 140 137  K 2.7* 4.2 4.0 4.9  CL 100* 102 103 99*  CO2 30 27 29 30   GLUCOSE 79 222* 74 132*  BUN 10 13 15 14   CREATININE 0.86 1.09 0.87 0.95  CALCIUM 8.0* 7.9* 8.4* 8.4*  MG 1.5* 1.6* 1.9  --     GFR: Estimated Creatinine Clearance: 46.1 mL/min (by C-G formula based on SCr of 0.95 mg/dL).  Liver Function Tests: Recent Labs  Lab 03/30/17 0548  AST 20  ALT 47  ALKPHOS 241*  BILITOT 0.6  PROT 5.6*  ALBUMIN 2.7*    CBG: Recent Labs  Lab 03/31/17 1430 03/31/17 1621 03/31/17 2103 04/01/17 0751 04/01/17 0826  GLUCAP 103* 90 75 46* 71    Recent Results (from the past 240 hour(s))  Blood culture (routine x 2)     Status: None   Collection Time: 03/24/17  8:40 PM  Result Value Ref Range Status  Specimen Description   Final    BLOOD RIGHT ARM Performed at [facility] [address], [place], [place] [address]    Special Requests   Final    BOTTLES DRAWN AEROBIC AND ANAEROBIC Blood Culture adequate volume Performed at [facility] [address], [place], [place] [address]    Culture   Final    NO GROWTH 5 DAYS Performed at [facility], [place] [address], [place], [place] [address]    Report Status 03/29/2017 FINAL  Final  Blood culture (routine x 2)     Status: None   Collection Time: 03/24/17  8:57 PM  Result Value Ref Range Status   Specimen Description   Final    BLOOD LEFT HAND Performed at [facility] [address] [place], [place] [address]    Special Requests   Final    BOTTLES DRAWN AEROBIC ONLY Blood Culture adequate volume Performed at [facility] [address] [place], [place] [address]    Culture   Final    NO GROWTH 5 DAYS Performed at [facility], [place] [address] [place], [place] [address]    Report Status 03/29/2017 FINAL  Final  MRSA PCR Screening     Status: None   Collection Time: 03/24/17 10:48 PM  Result Value Ref Range Status   MRSA by PCR NEGATIVE NEGATIVE Final    Comment:        The GeneXpert MRSA Assay (FDA approved for NASAL specimens only), is one component of a comprehensive MRSA colonization surveillance program. It is not intended to diagnose MRSA infection nor to guide or monitor treatment for MRSA infections. Performed at [facility] [place] [address] [place], [place] [address]       Radiology Studies: No results found.   Medications:  Scheduled: . amoxicillin-clavulanate  1 tablet Oral Q12H  . diltiazem  240 mg Oral Daily  . feeding supplement (ENSURE ENLIVE)  237 mL Oral TID BM  . levothyroxine  100 mcg Oral QAC breakfast  . senna  1 tablet Oral Daily   Continuous:  YYQ:MGNOIBBCWUG, ipratropium-albuterol, morphine CONCENTRATE, ondansetron (ZOFRAN) IV, polyethylene glycol  Assessment/Plan:  Active Problems:   Sepsis ([place])    Atrial fibrillation with RVR Patient is atrial fibrillation is reasonably well controlled.  He is noted to have high heart rate in the early morning hours but heart rate gets controlled after he gets his morning medications.  Continue Cardizem 240 mg daily.  He was on anticoagulation with Xarelto at home which was held due to thrombocytopenia.  Platelet counts continue to improve.  Okay to resume Xarelto at discharge.    Postobstructive pneumonia with acute hypoxic respiratory failure Patient continues to improve.  Patient was on vancomycin and cefepime  initially.  Changed over to oral Augmentin on 2/19.  Respiratory status remains stable.  Continue to monitor.   Stage IV lung cancer Followed by oncology and based on their notes it does not appear like patient is a candidate for any further treatment.  Patient and daughter are aware.  Palliative medicine has seen the patient.  He will go to skilled nursing facility with palliative medicine to follow.  History of diabetes mellitus type 2 with episodes of hypoglycemia Patient with episodes of hypoglycemia over the last 36 hours.  Last dose of Lantus was on Thursday night.  Patient was on steroids as well initially.  Hypoglycemia thought to be due to  combination of insulin, discontinuation of steroids and poor oral intake.  HbA1c was 9.8 in January.  The patient started on Ensure.  Patient encouraged to increase his oral intake.  Discussed with nursing staff.  Discontinue all forms of insulin.  Discussed with his wife in detail.   Hypokalemia and hypomagnesemia Potassium and magnesium levels improved.  Severe protein calorie malnutrition Secondary to cancer.  Encourage oral intake.  Thrombocytopenia Likely secondary to chemotherapy.  Platelet counts have been improving.  No evidence for bleeding.  DVT Prophylaxis: SCDs    Code Status: DNR Family Communication: Discussed with the patient.  Discussed with his wife over the phone. Disposition Plan: Management as outlined above.  If patient does not have any further episodes of hypoglycemia over the next 24 hours he could be discharged to skilled nursing facility tomorrow, 2/24.  Palliative medicine to follow at skilled nursing facility.    LOS: 8 days   [facility] Pager [phone] 04/01/2017, 9:30 AM  If 7PM-7AM, please contact night-coverage at [email or web address], password [facility]

## 2017-04-02 LAB — GLUCOSE, CAPILLARY
Glucose-Capillary: 202 mg/dL — ABNORMAL HIGH (ref 65–99)
Glucose-Capillary: 288 mg/dL — ABNORMAL HIGH (ref 65–99)

## 2017-04-02 MED ORDER — ENSURE ENLIVE PO LIQD: 237.0000 mL | Freq: Three times a day (TID) | ORAL | 12 refills | Status: AC

## 2017-04-02 MED ORDER — INSULIN ASPART 100 UNIT/ML ~~LOC~~ SOLN: SUBCUTANEOUS | 11 refills | Status: AC

## 2017-04-02 MED ORDER — LORAZEPAM 0.5 MG PO TABS: 0.5000 mg | ORAL_TABLET | Freq: Four times a day (QID) | ORAL | 0 refills | Status: DC | PRN

## 2017-04-02 NOTE — Progress Notes (Signed)
Pt leaving at this time with [facility]. Headed to [facility].

## 2017-04-02 NOTE — Progress Notes (Signed)
Writer gave report to "Murray" at [facility].

## 2017-04-02 NOTE — Progress Notes (Signed)
Patient will discharge to [facility]  Anticipated discharge date: 04/02/17 Family notified: Zayed Griffie, spouse Transportation by: [facility]  Nurse to call report to [phone].   CSW signing off.  Estanislado Emms, [place]  Clinical Social Worker

## 2017-04-02 NOTE — Progress Notes (Signed)
Pt leaving this evening with [facility], headed to [facility]. Family aware of transfer.

## 2017-04-02 NOTE — Discharge Summary (Signed)
[facility]  Physician Discharge Summary   Patient ID: [name] MRN: [id number] DOB/AGE: 82-Sep-1935 82 y.o.  Admit date: 03/24/2017 Discharge date: 04/02/2017  PCP: Lavone Orn, MD  DISCHARGE DIAGNOSES:  Active Problems:   Sepsis ([place])   RECOMMENDATIONS FOR OUTPATIENT FOLLOW UP: 1. Please avoid aggressive control of diabetes. 2. Use sliding scale coverage as mentioned under the medication list. 3. Palliative medicine to see patient at skilled nursing facility for further goals of care.   DISCHARGE CONDITION: fair  Diet recommendation: Regular as tolerated.  Encourage oral intake.  Can change to carb modified if his blood sugars remain elevated.  Filed Weights   03/24/17 1334  Weight: 53.5 kg (118 lb)    INITIAL HISTORY: 84 year old Caucasian male with past medical history of stage IV lung cancer, pancreatic cancer, recent chemotherapy 9 days prior to admission, hypothyroidism presented with abdominal pain nausea vomiting generalized weakness.  Imaging studies raised concern for pneumonia.  Patient was started on antibiotics.  He also developed atrial fibrillation with RVR.  Most of his issues have been stable.  Over the last 24 hours he has been noted to have frequent readings of low glucose levels.  Consultations:  Oncology  [place] COURSE:   Atrial fibrillation with RVR Patient is atrial fibrillation is reasonably well controlled.  He is noted to have high heart rate in the early morning hours but heart rate gets controlled after he gets his morning medication.  Continue Cardizem 240 mg daily.  He was on anticoagulation with Xarelto at home which was held due to thrombocytopenia.  Platelet counts continue to improve. Okay to resume Xarelto at discharge.    Postobstructive pneumonia with acute hypoxic respiratory failure Patient continues to improve.  Patient was on vancomycin and cefepime initially.  Changed over to oral  Augmentin on 2/19.  Respiratory status remains stable.   Stage IV lung cancer Followed by oncology and based on their notes it does not appear like patient is a candidate for any further treatment.  Patient and daughter are aware.  Palliative medicine has seen the patient.  He will go to skilled nursing facility with palliative medicine to follow.  History of diabetes mellitus type 2 with episodes of hypoglycemia At the time of admission patient was noted to be on Lantus.  He was getting steroids as part of his chemotherapeutic regimen.  Steroids were discontinued.  He has had poor oral intake in the hospital.  He developed hypoglycemia on 2/21.  His Lantus dose was subsequently discontinued.  He was started on Ensure.  His diet was liberalized.  Finally his blood glucose levels started improving over the last 24 hours.  His CBG's are in the hyperglycemic range but we would not be too aggressive treating this patient's hyperglycemia in order to avoid significant hypoglycemia.  His HbA1c was 9.8 in January.  Continue to monitor CBGs 3 times a day use sliding scale as recommended. Only if blood glucose levels stays consistently greater than 250 should a long-acting agent be considered.     Hypokalemia and hypomagnesemia Potassium and magnesium levels improved.  Severe protein calorie malnutrition Secondary to cancer.  Encourage oral intake. Continue Ensure.  Could substitute with Glucerna if elevated blood glucose levels remain a problem.  Thrombocytopenia Likely secondary to chemotherapy.  Platelet counts have been improving.  No evidence for bleeding.  Okay to resume anticoagulation.  Overall stable.  Okay for discharge to skilled nursing facility today.  Palliative medicine to continue  following the patient at skilled nursing facility for further goals of care and eventual transition to hospice.    PERTINENT LABS:  The results of significant diagnostics from this hospitalization  (including imaging, microbiology, ancillary and laboratory) are listed below for reference.    Microbiology: Recent Results (from the past 240 hour(s))  Blood culture (routine x 2)     Status: None   Collection Time: 03/24/17  8:40 PM  Result Value Ref Range Status   Specimen Description   Final    BLOOD RIGHT ARM Performed at [facility] [address] [place], [place] [address]    Special Requests   Final    BOTTLES DRAWN AEROBIC AND ANAEROBIC Blood Culture adequate volume Performed at [facility] [address] [place] [address] [place], [place] [address]    Culture   Final    NO GROWTH 5 DAYS Performed at [facility], [place] [address] [place], [place] [address]    Report Status 03/29/2017 FINAL  Final  Blood culture (routine x 2)     Status: None   Collection Time: 03/24/17  8:57 PM  Result Value Ref Range Status   Specimen Description   Final    BLOOD LEFT HAND Performed at [facility] [address], [place], [place] [address]    Special Requests   Final    BOTTLES DRAWN AEROBIC ONLY Blood Culture adequate volume Performed at [facility] [address] [place], [place] [address]    Culture   Final    NO GROWTH 5 DAYS Performed at [facility], [place] [address] [place], [place] [address]    Report Status 03/29/2017 FINAL  Final  MRSA PCR Screening     Status: None   Collection Time: 03/24/17 10:48 PM  Result Value Ref Range Status   MRSA by PCR NEGATIVE NEGATIVE Final    Comment:        The GeneXpert MRSA Assay (FDA approved for NASAL specimens only), is one component of a comprehensive MRSA colonization surveillance program. It is not intended to diagnose MRSA infection nor to guide or monitor treatment for MRSA infections. Performed at [facility], [place] [address], [place], [place] [address]      Labs: Basic Metabolic Panel: Recent Labs  Lab 03/27/17 0519  03/28/17 0331 03/29/17 0330 03/30/17 0548  NA 137 137 140 137  K 2.7* 4.2 4.0 4.9  CL 100* 102 103 99*  CO2 30 27 29 30   GLUCOSE 79 222* 74 132*  BUN 10 13 15 14   CREATININE 0.86 1.09 0.87 0.95  CALCIUM 8.0* 7.9* 8.4* 8.4*  MG 1.5* 1.6* 1.9  --    Liver Function Tests: Recent Labs  Lab 03/30/17 0548  AST 20  ALT 47  ALKPHOS 241*  BILITOT 0.6  PROT 5.6*  ALBUMIN 2.7*   CBC: Recent Labs  Lab 03/27/17 0519 03/28/17 0331 03/29/17 0330 03/30/17 0548 03/31/17 0546  WBC 2.8* 3.6* 4.3 3.1* 2.8*  NEUTROABS  --   --   --  1.5*  --   HGB 11.3* 10.4* 11.7* 12.0* 12.9*  HCT 33.8* 30.2* 34.9* 36.3* 40.4  MCV 92.6 93.5 93.6 95.8 96.9  PLT 30* 41* 68* 108* 157    CBG: Recent Labs  Lab 04/01/17 1203 04/01/17 1406 04/01/17 1702 04/01/17 2152 04/02/17 0734  GLUCAP 328* 276* 293* 372* 202*     IMAGING STUDIES Dg Chest Port 1 View  Result Date:  03/28/2017 CLINICAL DATA:  Follow-up pneumonia. EXAM: PORTABLE CHEST 1 VIEW COMPARISON:  03/24/2017 FINDINGS: Persistent airspace consolidation within the left lung base is again noted. Chronic consolidative changes and/or atelectasis is again noted and appears unchanged from the previous exam. The left upper lobe appears well aerated as does the right lung. IMPRESSION: 1. No change in aeration to the left lung base compared with prior exam. Electronically Signed   By: Kerby Moors M.D.   On: 03/28/2017 09:47   Dg Chest Portable 1 View  Result Date: 03/24/2017 CLINICAL DATA:  Shortness of breath.  Stage IV lung cancer. EXAM: PORTABLE CHEST 1 VIEW COMPARISON:  Chest x-ray dated February 16, 2017. FINDINGS: The heart size and mediastinal contours are within normal limits. Normal pulmonary vascularity. Dense consolidation in the left lower lobe with volume loss in the left hemithorax. Probable small left pleural effusion. The right lung is clear. No pneumothorax. No acute osseous abnormality. IMPRESSION: Dense consolidation versus  atelectasis in the left lower lobe. Small left pleural effusion. Electronically Signed   By: Titus Dubin M.D.   On: 03/24/2017 16:32    DISCHARGE EXAMINATION: Vitals:   04/01/17 0602 04/01/17 1345 04/01/17 2154 04/02/17 0518  BP: 110/68 (!) 114/55 125/73 123/73  Pulse: 97 81 86 88  Resp: 19 18 17 16   Temp: 97.6 F (36.4 C) 97.8 F (36.6 C) 98.3 F (36.8 C) 98.4 F (36.9 C)  TempSrc: Oral Oral Oral Oral  SpO2: 97% 93% 95% 96%  Weight:      Height:       General appearance: alert, cooperative and no distress Resp: Coarse breath sounds bilaterally.  No wheezing rales or rhonchi.  Normal effort at rest. Cardio: S1-S2 is irregularly irregular.  No S3-S4.  No rubs murmurs of bruit. GI: soft, non-tender; bowel sounds normal; no masses,  no organomegaly He is awake alert.  Somewhat distracted.  No obvious focal neurological deficits.  DISPOSITION: SNF  Discharge Instructions    Call MD for:  extreme fatigue   Complete by:  As directed    Call MD for:  persistant dizziness or light-headedness   Complete by:  As directed    Call MD for:  persistant nausea and vomiting   Complete by:  As directed    Call MD for:  severe uncontrolled pain   Complete by:  As directed    Call MD for:  temperature >100.4   Complete by:  As directed    Discharge instructions   Complete by:  As directed    Please see instructions on the DC summary.  You were cared for by a hospitalist during your hospital stay. If you have any questions about your discharge medications or the care you received while you were in the hospital after you are discharged, you can call the unit and asked to speak with the hospitalist on call if the hospitalist that took care of you is not available. Once you are discharged, your primary care physician will handle any further medical issues. Please note that NO REFILLS for any discharge medications will be authorized once you are discharged, as it is imperative that you return  to your primary care physician (or establish a relationship with a primary care physician if you do not have one) for your aftercare needs so that they can reassess your need for medications and monitor your lab values. If you do not have a primary care physician, you can call [phone] for a physician referral.   Increase  activity slowly   Complete by:  As directed         Allergies as of 04/02/2017      Reactions   Promethazine Other (See Comments)   Makes hyper      Medication List    STOP taking these medications   atorvastatin 10 MG tablet Commonly known as:  LIPITOR   BASAGLAR KWIKPEN 100 UNIT/ML Sopn   dexamethasone 4 MG tablet Commonly known as:  DECADRON   glimepiride 2 MG tablet Commonly known as:  AMARYL   metFORMIN 500 MG 24 hr tablet Commonly known as:  GLUCOPHAGE-XR   ondansetron 8 MG tablet Commonly known as:  ZOFRAN   prochlorperazine 10 MG tablet Commonly known as:  COMPAZINE   prochlorperazine 25 MG suppository Commonly known as:  COMPAZINE   zolpidem 5 MG tablet Commonly known as:  AMBIEN     TAKE these medications   amoxicillin-clavulanate 875-125 MG tablet Commonly known as:  AUGMENTIN Take 1 tablet by mouth every 12 (twelve) hours for 5 days.   diltiazem 240 MG 24 hr capsule Commonly known as:  DILACOR XR Take 240 mg by mouth daily.   diltiazem 60 MG tablet Commonly known as:  CARDIZEM Take 60 mg by mouth 3 (three) times daily as needed (for heart palpitations).   feeding supplement (ENSURE ENLIVE) Liqd Take 237 mLs by mouth 3 (three) times daily between meals.   folic acid 1 MG tablet Commonly known as:  FOLVITE Take 1 tablet daily. Take daily starting 5-7 days before Alimta chemotherapy. Continue until 21 days after Alimta completed.   insulin aspart 100 UNIT/ML injection Commonly known as:  novoLOG Sliding Scale Coverage, Three times daily before meals: CBG: <80: Initiate hypoglycemic protocol CBG: 81-180: 0 Units CBG:  181-250: 2 Units CBG: 251-300: 3 units CBG: >300: Ask MD What changed:    how much to take  how to take this  when to take this  additional instructions   ipratropium-albuterol 0.5-2.5 (3) MG/3ML Soln Commonly known as:  DUONEB Take 3 mLs by nebulization every 6 (six) hours as needed.   levothyroxine 100 MCG tablet Commonly known as:  SYNTHROID, LEVOTHROID Take 100 mcg by mouth daily before breakfast.   LORazepam 0.5 MG tablet Commonly known as:  ATIVAN Take 1 tablet (0.5 mg total) by mouth every 6 (six) hours as needed (Nausea or vomiting).   morphine CONCENTRATE 10 MG/0.5ML Soln concentrated solution Take 0.25 mLs (5 mg total) by mouth every 2 (two) hours as needed for severe pain or shortness of breath.   oxandrolone 10 MG tablet Commonly known as:  OXANDRIN Take 1 tablet (10 mg total) by mouth daily after breakfast.   polyethylene glycol packet Commonly known as:  MIRALAX / GLYCOLAX Take 17 g by mouth daily as needed for moderate constipation.   rivaroxaban 10 MG Tabs tablet Commonly known as:  XARELTO Take 1 tablet (10 mg total) by mouth daily with supper.   senna 8.6 MG Tabs tablet Commonly known as:  SENOKOT Take 1 tablet (8.6 mg total) by mouth daily.        Follow-up Information    Lavone Orn, MD. Schedule an appointment as soon as possible for a visit in 3 week(s).   Specialty:  Internal Medicine Contact information: [address] [place] [address] [phone]           TOTAL DISCHARGE TIME: 35 minutes  Bonnielee Haff  [facility] Pager [phone]  04/02/2017, 10:55 AM

## 2017-04-02 NOTE — Clinical Social Work Placement (Signed)
   CLINICAL SOCIAL WORK PLACEMENT  NOTE  Date:  04/02/2017  Patient Details  Name: [name] MRN: [id number] Date of Birth: December 28, 1933  Clinical Social Work is seeking post-discharge placement for this patient at the [place] level of care (*CSW will initial, date and re-position this form in  chart as items are completed):  Yes   Patient/family provided with [facility] Work Department's list of facilities offering this level of care within the geographic area requested by the patient (or if unable, by the patient's family).  Yes   Patient/family informed of their freedom to choose among providers that offer the needed level of care, that participate in Medicare, Medicaid or managed care program needed by the patient, have an available bed and are willing to accept the patient.  Yes   Patient/family informed of [facility]'s ownership interest in [facility] and [facility], as well as of the fact that they are under no obligation to receive care at these facilities.  PASRR submitted to EDS on 03/29/17     PASRR number received on 03/29/17     Existing PASRR number confirmed on       FL2 transmitted to all facilities in geographic area requested by pt/family on 03/29/17     FL2 transmitted to all facilities within larger geographic area on       Patient informed that his/her managed care company has contracts with or will negotiate with certain facilities, including the following:  [facility]     Yes   Patient/family informed of bed offers received.  Patient chooses bed at [facility]     Physician recommends and patient chooses bed at      Patient to be transferred to [facility] on 04/02/17.  Patient to be transferred to facility by [facility]     Patient family notified on 04/02/17 of transfer.  Name of family member notified:  Brayton Mars, spouse     PHYSICIAN        Additional Comment:    _______________________________________________ Estanislado Emms, LCSW 04/02/2017, 12:45 PM

## 2017-04-03 ENCOUNTER — Non-Acute Institutional Stay (SKILLED_NURSING_FACILITY): Payer: Medicare Other | Admitting: Internal Medicine

## 2017-04-03 ENCOUNTER — Encounter: Payer: Self-pay | Admitting: Internal Medicine

## 2017-04-03 ENCOUNTER — Other Ambulatory Visit: Payer: Self-pay | Admitting: *Deleted

## 2017-04-03 DIAGNOSIS — E43 Unspecified severe protein-calorie malnutrition: Secondary | ICD-10-CM

## 2017-04-03 DIAGNOSIS — J9601 Acute respiratory failure with hypoxia: Secondary | ICD-10-CM

## 2017-04-03 DIAGNOSIS — E876 Hypokalemia: Secondary | ICD-10-CM | POA: Diagnosis not present

## 2017-04-03 DIAGNOSIS — E034 Atrophy of thyroid (acquired): Secondary | ICD-10-CM

## 2017-04-03 DIAGNOSIS — J189 Pneumonia, unspecified organism: Secondary | ICD-10-CM

## 2017-04-03 DIAGNOSIS — C3430 Malignant neoplasm of lower lobe, unspecified bronchus or lung: Secondary | ICD-10-CM

## 2017-04-03 DIAGNOSIS — D500 Iron deficiency anemia secondary to blood loss (chronic): Secondary | ICD-10-CM

## 2017-04-03 DIAGNOSIS — D696 Thrombocytopenia, unspecified: Secondary | ICD-10-CM | POA: Diagnosis not present

## 2017-04-03 DIAGNOSIS — Z794 Long term (current) use of insulin: Secondary | ICD-10-CM

## 2017-04-03 DIAGNOSIS — C7802 Secondary malignant neoplasm of left lung: Secondary | ICD-10-CM | POA: Diagnosis not present

## 2017-04-03 DIAGNOSIS — E119 Type 2 diabetes mellitus without complications: Secondary | ICD-10-CM

## 2017-04-03 DIAGNOSIS — I4891 Unspecified atrial fibrillation: Secondary | ICD-10-CM | POA: Diagnosis not present

## 2017-04-03 NOTE — Progress Notes (Signed)
: Provider:  Hennie Duos MD Location:  [facility] Room Number: [phone] Place of Service:  SNF [phone])  PCP: Lavone Orn, MD Patient Care Team: Lavone Orn, MD as PCP - General (Internal Medicine)  Extended Emergency Contact Information Primary Emergency Contact: Nakagawa,Becky Address: [address]          [place], [place] [address] Johnnette Litter of [place] Phone: [phone] Mobile Phone: [phone] Relation: Spouse     Allergies: Promethazine  Chief Complaint  Patient presents with  . New Admit To SNF    Admit to Facility    HPI: Patient is 84 y.o. male with history of stage IV lung cancer, pancreatic cancer with recent chemotherapy 9 days prior to admission, hypothyroidism and is on who presented to [facility] with abdominal pain, nausea, vomiting, and generalized weakness. Imaging studies raise concern for pneumonia. Patient was admitted to [facility] from 2/15-24 where patient was started on broad-spectrum antibiotics. Hospital course was complicated by atrial fibrillation with RVR which was treated and resolved. Hospital course was further complicated by hypoglycemia which may not be completely resolved and patient will need to be watched at skilled nursing facility. Patient is admitted to skilled nursing facility for OT/PT and supportive care. While at skilled nursing facility patient will be followed for hypothyroidism treated with replacement, diabetes mellitus 2 treated with insulin, and protein malnutrition treated with protein supplements.  Past Medical History:  Diagnosis Date  . Adenocarcinoma of lung, stage 4, right ([place]) 03/03/2017  . Atrial fibrillation ([place])   . 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 Counseling regarding goals of care 03/03/2017  . Diabetes mellitus   . H/O echocardiogram 2003,2009,2010, 2012  . Hypertension   . Hypothyroidism   . Iron deficiency anemia, unspecified  01/27/2013  . Lung cancer ([place]) dx'd 02/2009   surg only  . Lung cancer, lower lobe ([place]) 01/27/2013  . Mass of left lung   . Metastatic lung carcinoma, left ([place]) 03/03/2017  . pancreatic ca dx'd 2004   oral chemo/xrt comp  . Wears glasses     Past Surgical History:  Procedure Laterality Date  . CHOLECYSTECTOMY    . COLONOSCOPY    . HERNIA REPAIR    . MULTIPLE TOOTH EXTRACTIONS    . TONSILLECTOMY    . VIDEO BRONCHOSCOPY N/A 02/16/2017   Procedure: VIDEO BRONCHOSCOPY;  Surgeon: Melrose Nakayama, MD;  Location: [facility];  Service: Thoracic;  Laterality: N/A;  . VIDEO BRONCHOSCOPY WITH ENDOBRONCHIAL ULTRASOUND N/A 10/20/2014   Procedure: VIDEO BRONCHOSCOPY WITH ENDOBRONCHIAL ULTRASOUND;  Surgeon: Melrose Nakayama, MD;  Location: [facility];  Service: Thoracic;  Laterality: N/A;    Allergies as of 04/03/2017      Reactions   Promethazine Other (See Comments)   Makes hyper      Medication List        Accurate as of 04/03/17  9:16 AM. Always use your most recent med list.          amoxicillin-clavulanate 875-125 MG tablet Commonly known as:  AUGMENTIN Take 1 tablet by mouth every 12 (twelve) hours for 5 days.   diltiazem 240 MG 24 hr capsule Commonly known as:  DILACOR XR Take 240 mg by mouth daily.   diltiazem 60 MG tablet Commonly known as:  CARDIZEM Take 60 mg by mouth 3 (three) times daily as needed (for heart palpitations).   feeding supplement (ENSURE ENLIVE) Liqd Take 237 mLs by  mouth 3 (three) times daily between meals.   folic acid 1 MG tablet Commonly known as:  FOLVITE Take 1 tablet daily. Take daily starting 5-7 days before Alimta chemotherapy. Continue until 21 days after Alimta completed.   insulin aspart 100 UNIT/ML injection Commonly known as:  novoLOG Sliding Scale Coverage, Three times daily before meals: CBG: <80: Initiate hypoglycemic protocol CBG: 81-180: 0 Units CBG: 181-250: 2 Units CBG: 251-300: 3 units CBG: >300: Ask MD     ipratropium-albuterol 0.5-2.5 (3) MG/3ML Soln Commonly known as:  DUONEB Take 3 mLs by nebulization every 6 (six) hours as needed.   levothyroxine 100 MCG tablet Commonly known as:  SYNTHROID, LEVOTHROID Take 100 mcg by mouth daily before breakfast.   LORazepam 0.5 MG tablet Commonly known as:  ATIVAN Take 1 tablet (0.5 mg total) by mouth every 6 (six) hours as needed (Nausea or vomiting).   morphine CONCENTRATE 10 MG/0.5ML Soln concentrated solution Take 0.25 mLs (5 mg total) by mouth every 2 (two) hours as needed for severe pain or shortness of breath.   oxandrolone 10 MG tablet Commonly known as:  OXANDRIN Take 1 tablet (10 mg total) by mouth daily after breakfast.   polyethylene glycol packet Commonly known as:  MIRALAX / GLYCOLAX Take 17 g by mouth daily as needed for moderate constipation.   rivaroxaban 10 MG Tabs tablet Commonly known as:  XARELTO Take 1 tablet (10 mg total) by mouth daily with supper.   senna 8.6 MG Tabs tablet Commonly known as:  SENOKOT Take 1 tablet (8.6 mg total) by mouth daily.       No orders of the defined types were placed in this encounter.   Immunization History  Administered Date(s) Administered  . Influenza-Unspecified 12/01/2014    Social History   Tobacco Use  . Smoking status: Former Smoker    Packs/day: 2.00    Years: 7.00    Pack years: 14.00    Types: Cigarettes    Start date: 09/30/1950    Last attempt to quit: 04/29/1957    Years since quitting: 59.9  . Smokeless tobacco: Never Used  . Tobacco comment: quit 54 years ago  Substance Use Topics  . Alcohol use: Yes    Alcohol/week: 0.0 oz    Comment:  " one beer per month "    Family history is   Family History  Problem Relation Age of Onset  . Emphysema Father   . Lung cancer Brother       Review of Systems  DATA OBTAINED: from patient, nurse GENERAL:  no fevers, fatigue, appetite changes SKIN: No itching, or rash EYES: No eye pain, redness,  discharge EARS: No earache, tinnitus, change in hearing NOSE: No congestion, drainage or bleeding  MOUTH/THROAT: No mouth or tooth pain, No sore throat RESPIRATORY: No cough, wheezing, SOB CARDIAC: No chest pain, palpitations, lower extremity edema  GI: No abdominal pain, No N/V/D or constipation, No heartburn or reflux  GU: No dysuria, frequency or urgency, or incontinence  MUSCULOSKELETAL: No unrelieved bone/joint pain NEUROLOGIC: No headache, dizziness or focal weakness PSYCHIATRIC: No c/o anxiety or sadness   Vitals:   04/03/17 0906  BP: 116/66  Pulse: 77  Resp: 20  Temp: 98.5 F (36.9 C)  SpO2: 96%    SpO2 Readings from Last 1 Encounters:  04/03/17 96%   Body mass index is 18.48 kg/m.     Physical Exam  GENERAL APPEARANCE: Alert, conversant,  No acute distress.  SKIN: No diaphoresis rash HEAD:  Normocephalic, atraumatic  EYES: Conjunctiva/lids clear. Pupils round, reactive. EOMs intact.  EARS: External exam WNL, canals clear. Hearing grossly normal.  NOSE: No deformity or discharge.  MOUTH/THROAT: Lips w/o lesions  RESPIRATORY: Breathing is even, unlabored. Lung sounds are clear   CARDIOVASCULAR: Heart RRR no murmurs, rubs or gallops. No peripheral edema.   GASTROINTESTINAL: Abdomen is soft, non-tender, not distended w/ normal bowel sounds. GENITOURINARY: Bladder non tender, not distended  MUSCULOSKELETAL: No abnormal joints or musculature NEUROLOGIC:  Cranial nerves 2-12 grossly intact. Moves all extremities  PSYCHIATRIC: Mood and affect appropriate to situation, no behavioral issues  Patient Active Problem List   Diagnosis Date Noted  . Sepsis ([place]) 03/24/2017  . Metastatic lung carcinoma, left ([place]) 03/03/2017  . Adenocarcinoma of lung, stage 4, right ([place]) 03/03/2017  . Counseling regarding goals of care 03/03/2017  . Unintentional weight loss 12/13/2016  . Wheezing on auscultation 11/02/2015  . Mitral valve insufficiency 11/02/2015  . Murmur 09/24/2015   . Dizziness 09/24/2015  . Lung cancer, lower lobe ([place]) 01/27/2013  . 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 Iron deficiency anemia due to chronic blood loss 01/27/2013  . Atrial fibrillation ([place]) 05/26/2012  . Long term current use of anticoagulant therapy 05/26/2012      Labs reviewed: Basic Metabolic Panel:    Component Value Date/Time   NA 137 03/30/2017 0548   NA 142 01/13/2017 1200   NA 141 05/10/2016 1101   K 4.9 03/30/2017 0548   K 3.4 01/13/2017 1200   K 4.4 05/10/2016 1101   CL 99 (L) 03/30/2017 0548   CL 99 01/13/2017 1200   CO2 30 03/30/2017 0548   CO2 32 01/13/2017 1200   CO2 30 (H) 05/10/2016 1101   GLUCOSE 132 (H) 03/30/2017 0548   GLUCOSE 227 (H) 01/13/2017 1200   BUN 14 03/30/2017 0548   BUN 13 01/13/2017 1200   BUN 17.6 05/10/2016 1101   CREATININE 0.95 03/30/2017 0548   CREATININE 1.20 03/14/2017 1044   CREATININE 1.3 (H) 01/13/2017 1200   CREATININE 1.1 05/10/2016 1101   CALCIUM 8.4 (L) 03/30/2017 0548   CALCIUM 9.4 01/13/2017 1200   CALCIUM 9.8 05/10/2016 1101   PROT 5.6 (L) 03/30/2017 0548   PROT 6.4 01/13/2017 1200   PROT 7.4 05/10/2016 1101   ALBUMIN 2.7 (L) 03/30/2017 0548   ALBUMIN 3.6 01/13/2017 1200   ALBUMIN 3.7 05/10/2016 1101   AST 20 03/30/2017 0548   AST 24 03/14/2017 1044   AST 14 05/10/2016 1101   ALT 47 03/30/2017 0548   ALT 22 03/14/2017 1044   ALT 22 01/13/2017 1200   ALT 11 05/10/2016 1101   ALKPHOS 241 (H) 03/30/2017 0548   ALKPHOS 81 01/13/2017 1200   ALKPHOS 79 05/10/2016 1101   BILITOT 0.6 03/30/2017 0548   BILITOT 1.0 03/14/2017 1044   BILITOT 0.53 05/10/2016 1101   GFRNONAA >60 03/30/2017 0548   GFRAA >60 03/30/2017 0548    Recent Labs    03/27/17 0519 03/28/17 0331 03/29/17 0330 03/30/17 0548  NA 137 137 140 137  K 2.7* 4.2 4.0 4.9  CL 100* 102 103 99*  CO2 30 27 29 30   GLUCOSE 79 222* 74 132*  BUN 10 13 15 14   CREATININE 0.86 1.09 0.87 0.95  CALCIUM 8.0* 7.9* 8.4* 8.4*  MG 1.5* 1.6* 1.9   --    Liver Function Tests: Recent Labs    03/14/17 1044 03/24/17 1418 03/30/17 0548  AST 24 16 20   ALT 22  25 47  ALKPHOS 115* 95 241*  BILITOT 1.0 0.9 0.6  PROT 6.9 6.4* 5.6*  ALBUMIN 3.9 3.5 2.7*   Recent Labs    03/24/17 1418  LIPASE 16   No results for input(s): AMMONIA in the last 8760 hours. CBC: Recent Labs    03/14/17 1044 03/24/17 1418  03/29/17 0330 03/30/17 0548 03/31/17 0546  WBC 5.4 1.5*   < > 4.3 3.1* 2.8*  NEUTROABS 4.2 1.0*  --   --  1.5*  --   HGB  --  13.9   < > 11.7* 12.0* 12.9*  HCT 39.5 40.0   < > 34.9* 36.3* 40.4  MCV 94.0 91.3   < > 93.6 95.8 96.9  PLT 107* 28*   < > 68* 108* 157   < > = values in this interval not displayed.   Lipid No results for input(s): CHOL, HDL, LDLCALC, TRIG in the last 8760 hours.  Cardiac Enzymes: No results for input(s): CKTOTAL, CKMB, CKMBINDEX, TROPONINI in the last 8760 hours. BNP: No results for input(s): BNP in the last 8760 hours. No results found for: [facility] Lab Results  Component Value Date   HGBA1C 9.8 (H) 02/16/2017   Lab Results  Component Value Date   TSH 3.238 03/14/2017   No results found for: VITAMINB12 No results found for: FOLATE Lab Results  Component Value Date   IRON 38 (L) 03/14/2017   TIBC 220 03/14/2017   FERRITIN 603 (H) 03/14/2017    Imaging and Procedures obtained prior to SNF admission: Dg Chest Portable 1 View  Result Date: 03/24/2017 CLINICAL DATA:  Shortness of breath.  Stage IV lung cancer. EXAM: PORTABLE CHEST 1 VIEW COMPARISON:  Chest x-ray dated February 16, 2017. FINDINGS: The heart size and mediastinal contours are within normal limits. Normal pulmonary vascularity. Dense consolidation in the left lower lobe with volume loss in the left hemithorax. Probable small left pleural effusion. The right lung is clear. No pneumothorax. No acute osseous abnormality. IMPRESSION: Dense consolidation versus atelectasis in the left lower lobe. Small left pleural effusion.  Electronically Signed   By: Titus Dubin M.D.   On: 03/24/2017 16:32     Not all labs, radiology exams or other studies done during hospitalization come through on my EPIC note; however they are reviewed by me.    Assessment and Plan  Acute hypoxic respiratory failure/postobstructive pneumonia-patient initially treated with vancomycin and cefepime and then changed to Augmentin SNF - admitted for OT/PT; continue Augmentin 875 every 12 hours for 5 more days  Atrial fibrillation with RVR-reportedly recently well controlled on Cardizem 240 mg daily; xarelto was initially held due to thrombocytopenia but his platelet counts improved so xarelto is resumed SNF - continue Cardizem 240 24 hour 1 by mouth daily with diltiazem 60 mg 3 times a day as necessary; continue Xarelto 10 mg daily  Stage IV lung cancer-based on oncology notes it does not appear patient is a candidate for any further treatment, patient and daughter are aware; palliative medicine has seen patient in the hospital SNF - we'll consult palliative care  Diabetes mellitus type 2/episodes of hypoglycemia-patient's blood sugars were controlled when he was getting steroids as part of his chemotherapy regimen; when steroids were discontinued his Lantus dose cause hypoglycemia and it was discontinued; patient was started on Ensure and his diet was liberalized patient is discharged only on sliding scale insulin; hemoglobin A1c 9.8 in January SNF - will continue sliding scale insulin; however since patient does have stage  IV cancer. No further treatment options would opt eventually get patient back on Lantus and let patient's blood sugar run highish if necessary for comfort care  Hypokalemia/hypomagnesemia-repleted SNF -will follow-up BMP plus magnesium  Severe protein malnutrition-secondary to cancer SNF - continue Ensure or Glucerna; pro-stat twice a day; continue oxandrolone 10 mg by mouth daily  Thrombocytopenia-secondary to  chemotherapy SNF - will follow-up CBC  Hypothyroidism SNF - stable; continue Synthroid 100 g by mouth daily   Time spent greater than 45 minutes;> 50% of time with patient was spent reviewing records, labs, tests and studies, counseling and developing plan of care  Webb Silversmith D. Sheppard Coil, MD

## 2017-04-04 ENCOUNTER — Inpatient Hospital Stay: Payer: Medicare Other

## 2017-04-04 ENCOUNTER — Inpatient Hospital Stay: Payer: Medicare Other | Admitting: Hematology & Oncology

## 2017-04-04 ENCOUNTER — Encounter: Payer: Self-pay | Admitting: Internal Medicine

## 2017-04-04 DIAGNOSIS — E43 Unspecified severe protein-calorie malnutrition: Secondary | ICD-10-CM | POA: Insufficient documentation

## 2017-04-04 DIAGNOSIS — I4891 Unspecified atrial fibrillation: Secondary | ICD-10-CM | POA: Insufficient documentation

## 2017-04-04 DIAGNOSIS — E119 Type 2 diabetes mellitus without complications: Secondary | ICD-10-CM | POA: Insufficient documentation

## 2017-04-04 DIAGNOSIS — E039 Hypothyroidism, unspecified: Secondary | ICD-10-CM | POA: Insufficient documentation

## 2017-04-04 DIAGNOSIS — J189 Pneumonia, unspecified organism: Secondary | ICD-10-CM | POA: Insufficient documentation

## 2017-04-04 DIAGNOSIS — J9601 Acute respiratory failure with hypoxia: Secondary | ICD-10-CM | POA: Insufficient documentation

## 2017-04-04 DIAGNOSIS — E876 Hypokalemia: Secondary | ICD-10-CM | POA: Insufficient documentation

## 2017-04-04 DIAGNOSIS — D696 Thrombocytopenia, unspecified: Secondary | ICD-10-CM | POA: Insufficient documentation

## 2017-04-06 ENCOUNTER — Telehealth: Payer: Self-pay | Admitting: *Deleted

## 2017-04-06 NOTE — Telephone Encounter (Signed)
Almyra Free, patient's daughter, is requesting a hospice referral. She states the patient and family are ready.  Spoke with Dr Marin Olp. He is okay with placing the referral.   Spoke with Amy at [facility]. Referral made.

## 2017-04-18 ENCOUNTER — Telehealth: Payer: Self-pay

## 2017-04-18 NOTE — Telephone Encounter (Signed)
Received call from pt's spouse [name] stating that pt passed away last night. [name] verbalizes appreciation for all the care patient received in this office. Per [name] wanted [place] to know that he had a full, long life and "that he had a blast."  Dr [name] and office staff aware. Flowers ordered. dph

## 2017-04-19 ENCOUNTER — Other Ambulatory Visit: Payer: Self-pay | Admitting: Nurse Practitioner

## 2017-04-25 ENCOUNTER — Ambulatory Visit: Payer: Medicare Other

## 2017-04-25 ENCOUNTER — Other Ambulatory Visit: Payer: Medicare Other

## 2017-05-08 DEATH — deceased

## 2018-11-11 IMAGING — DX DG CHEST 1V PORT
1 series · 1 of 1 positions shown · non-contrast
Comparison: Chest x-ray dated February 16, 2017.

CLINICAL DATA: Shortness of breath.  Stage IV lung cancer.

EXAM:
PORTABLE CHEST 1 VIEW

[chest ap]
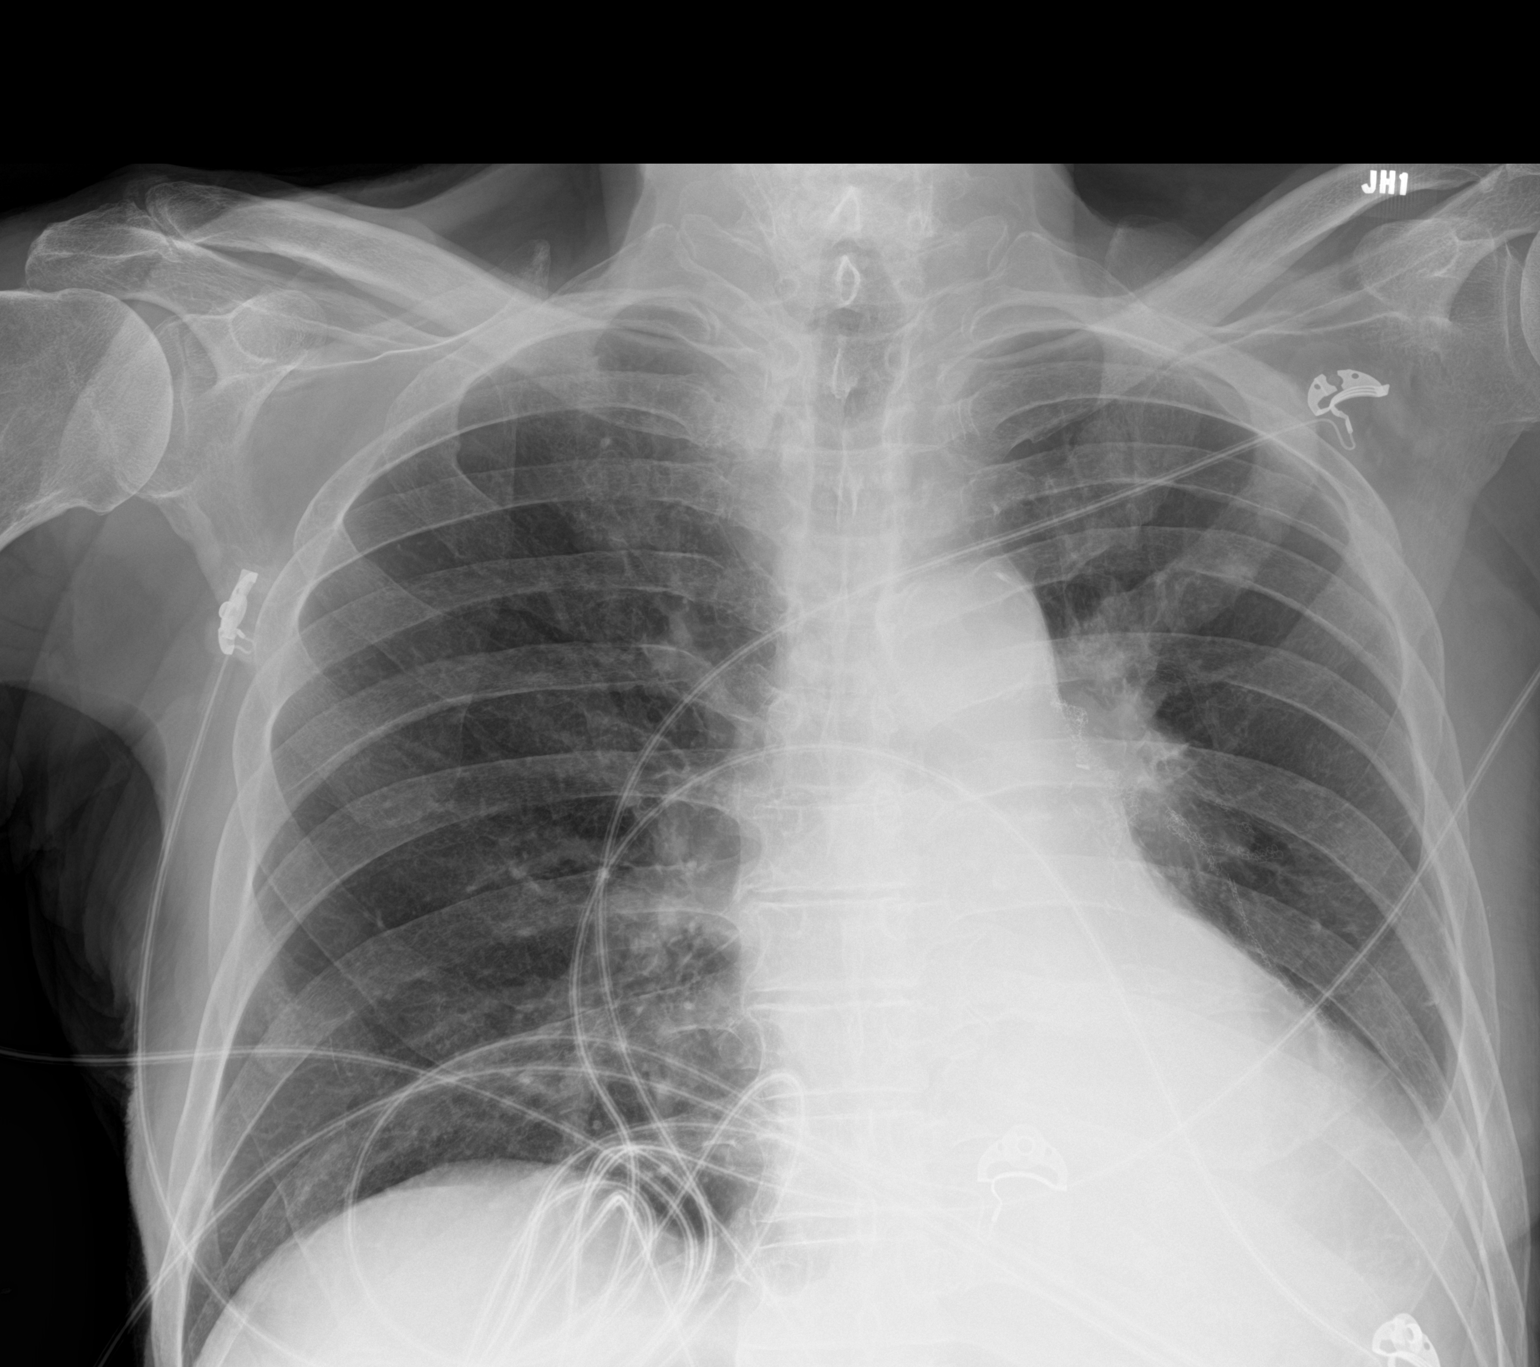

[1 of 1 positions shown; findings below may reference images not displayed]

FINDINGS: The heart size and mediastinal contours are within normal limits.
Normal pulmonary vascularity. Dense consolidation in the left lower
lobe with volume loss in the left hemithorax. Probable small left
pleural effusion. The right lung is clear. No pneumothorax. No acute
osseous abnormality.
IMPRESSION: Dense consolidation versus atelectasis in the left lower lobe. Small
left pleural effusion.

## 2018-11-15 IMAGING — DX DG CHEST 1V PORT
1 series · 1 of 1 positions shown · non-contrast
Comparison: 03/24/2017

CLINICAL DATA: Follow-up pneumonia.

EXAM:
PORTABLE CHEST 1 VIEW

[chest ap]
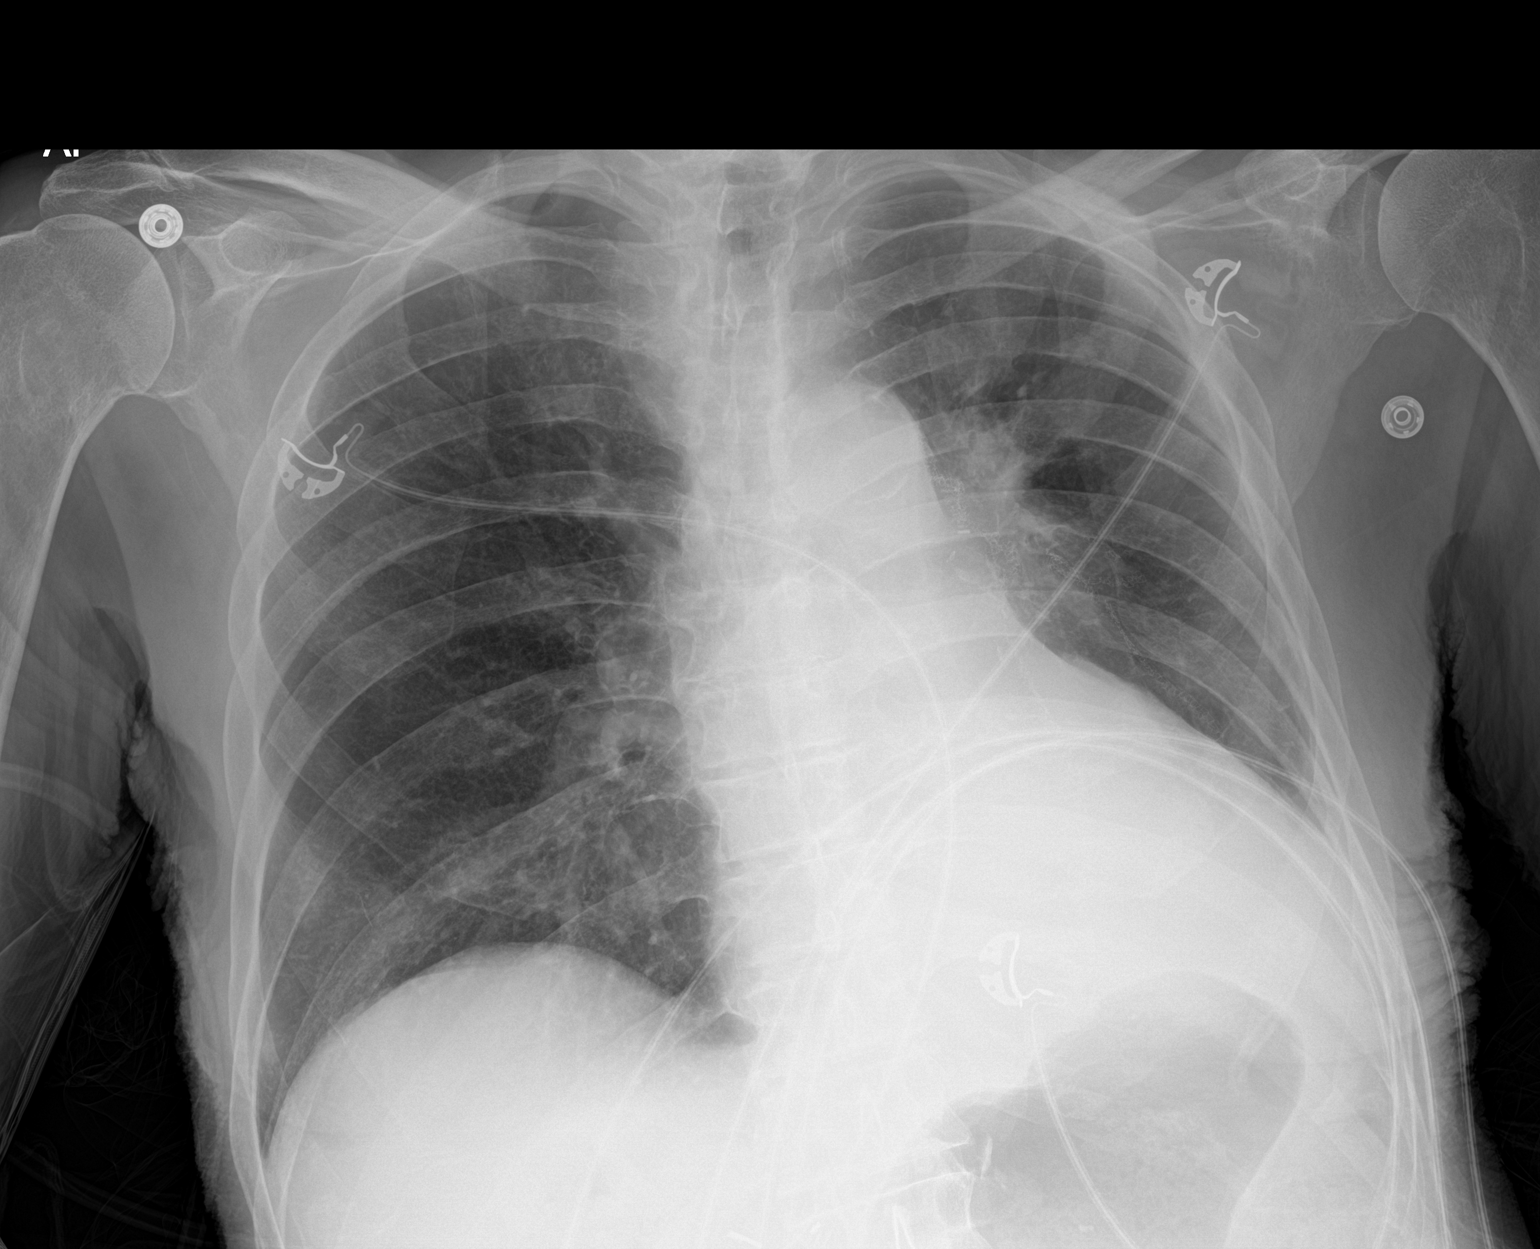

[1 of 1 positions shown; findings below may reference images not displayed]

FINDINGS: Persistent airspace consolidation within the left lung base is again
noted. Chronic consolidative changes and/or atelectasis is again
noted and appears unchanged from the previous exam. The left upper
lobe appears well aerated as does the right lung.
IMPRESSION: 1. No change in aeration to the left lung base compared with prior
exam.
